# Patient Record
Sex: Male | Born: 1960 | Race: Black or African American | Hispanic: No | Marital: Single | Smoking: Current some day smoker
Health system: Southern US, Community
[De-identification: ages and names within clinical notes are randomized; demographics above are authoritative.]

## PROBLEM LIST (undated history)

## (undated) DIAGNOSIS — J45909 Unspecified asthma, uncomplicated: Secondary | ICD-10-CM

## (undated) DIAGNOSIS — R0600 Dyspnea, unspecified: Secondary | ICD-10-CM

## (undated) DIAGNOSIS — E119 Type 2 diabetes mellitus without complications: Secondary | ICD-10-CM

## (undated) DIAGNOSIS — I10 Essential (primary) hypertension: Secondary | ICD-10-CM

## (undated) DIAGNOSIS — F99 Mental disorder, not otherwise specified: Secondary | ICD-10-CM

## (undated) DIAGNOSIS — M1990 Unspecified osteoarthritis, unspecified site: Secondary | ICD-10-CM

## (undated) DIAGNOSIS — F329 Major depressive disorder, single episode, unspecified: Secondary | ICD-10-CM

## (undated) DIAGNOSIS — F32A Depression, unspecified: Secondary | ICD-10-CM

## (undated) HISTORY — PX: OTHER SURGICAL HISTORY: SHX169

---

## 1999-07-04 ENCOUNTER — Emergency Department (HOSPITAL_COMMUNITY): Admission: EM | Admit: 1999-07-04 | Discharge: 1999-07-04 | Payer: Self-pay | Admitting: *Deleted

## 1999-07-14 ENCOUNTER — Encounter: Admission: RE | Admit: 1999-07-14 | Discharge: 1999-08-22 | Payer: Self-pay | Admitting: *Deleted

## 2004-02-23 ENCOUNTER — Emergency Department (HOSPITAL_COMMUNITY): Admission: EM | Admit: 2004-02-23 | Discharge: 2004-02-23 | Payer: Self-pay | Admitting: Emergency Medicine

## 2008-04-13 ENCOUNTER — Inpatient Hospital Stay (HOSPITAL_COMMUNITY): Admission: EM | Admit: 2008-04-13 | Discharge: 2008-04-17 | Payer: Self-pay | Admitting: Emergency Medicine

## 2008-04-16 ENCOUNTER — Encounter (INDEPENDENT_AMBULATORY_CARE_PROVIDER_SITE_OTHER): Payer: Self-pay | Admitting: Internal Medicine

## 2008-04-19 ENCOUNTER — Encounter: Payer: Self-pay | Admitting: Internal Medicine

## 2008-04-19 ENCOUNTER — Ambulatory Visit: Payer: Self-pay | Admitting: Internal Medicine

## 2008-04-19 DIAGNOSIS — Z86718 Personal history of other venous thrombosis and embolism: Secondary | ICD-10-CM | POA: Insufficient documentation

## 2010-02-03 ENCOUNTER — Emergency Department (HOSPITAL_COMMUNITY): Admission: EM | Admit: 2010-02-03 | Discharge: 2010-02-03 | Payer: Self-pay | Admitting: Emergency Medicine

## 2010-11-11 NOTE — Discharge Summary (Signed)
NAME:  Sean [name], Sean [name] NO.:  [id number]   MEDICAL RECORD NO.:  [id number]          PATIENT TYPE:  INP   LOCATION:  4703                         FACILITY:  [facility]   PHYSICIAN:  Peggye Pitt, M.D. DATE OF BIRTH:  Nov 19, 1960   DATE OF ADMISSION:  04/12/2008  DATE OF DISCHARGE:  04/17/2008                               DISCHARGE SUMMARY   DISCHARGE DIAGNOSES:  1. Acute pulmonary embolus.  2. Acute pericarditis.   DISCHARGE MEDICATIONS:  1. Lovenox 150 mg subcu q. day for at least 2 more days until he is      seen at the outpatient clinic.  2. Coumadin 6 mg p.o. daily until he is seen at the [facility].  3. Aspirin 325 mg p.o. daily for chest pain associated with      pericarditis.   DISPOSITION AND FOLLOWUP:  The patient is discharged home in [name]  condition.  His chest pain is much improved.  He will follow up at the  [facility] with Dr. Mellody [name] on Thursday,  May 20, 2008 at 2 p.m.  At that time a stat PT/INR should be drawn  to see if the patient needs to continue to be on Lovenox or not.   CONSULTATIONS THIS HOSPITALIZATION:  None.   IMAGES THIS HOSPITALIZATION:  Include a chest x-ray on April 12, 2008  that showed bibasilar linear opacities suggesting subsegmental  atelectasis.  He also had a CT angiogram of the chest on April 13, 2008 that showed pulmonary embolus with no evidence of right heart  strain and mild fatty infiltration of the liver.  He also had a 2D  echocardiogram on April 16, 2008 that showed an ejection fraction of  55% but the study was inadequate to evaluate left ventricular regional  wall motion.  Left ventricular diastolic function parameters were,  however, normal.   HISTORY AND PHYSICAL:  For full details, please refer to the history and  physical exam dictated by Dr. Mikeal [name] on April 12, 2008 but in brief,  Sean [name] is a 50 year old African American man with no  significant past  medical history who started having severe left-sided chest pain that had  persisted for about 24 hours.  The chest pain was located over his left  chest, was worse with laying down flat and improved some with leaning  forward.  He had no diaphoresis, no radiation.  For this reason, we are  called for admission.   HOSPITAL COURSE BY ACTIVE PROBLEM:  Chest pain.  He was ruled out for  acute myocardial infarction with 3 sets of negative cardiac enzymes.  Initial EKG in the emergency department was consistent with acute  pericarditis with diffuse S-T segment elevation.  A curbside cardiology  consult was obtained and they also reviewed the EKG and confirmed these  findings.  Because of an elevated D-dimer, he was also sent for a CT  angiogram of the chest, which did show an acute pulmonary embolus, so  therefore he has 2 reasons to have acute pleuritic chest  pain.  For his  pericarditis, we have kept him on colchicine and on aspirin while in the  hospital.  His chest pain has much improved.  In regard to his pulmonary  embolus, he has been on Coumadin and subcu Lovenox while in the  hospital.  His INR on the day of discharge is 1.7.  I have set up an  appointment with him to see Dr. Onalee Hua 2 days from now, at which time a  PT/INR should be checked and the patient should be given further  instructions as to whether or not he should continue the Lovenox.  I  suspect by that time his INR will be therapeutic.  For his pulmonary  embolus, a hypercoagulable panel was also obtained, that showed no  evidence for lupus anticoagulant with normal protein C and protein S  levels but that will need to be followed up on as an outpatient because  his prothrombin gene mutation as well as his factor V Leiden are still  pending.  No other issues throughout this hospitalization.   VITAL SIGNS ON DAY OF DISCHARGE:  Blood pressure 131/79, heart rate 63,  respirations 18, O2 sats 94% on room  air with a temperature of 97.5.   LABS ON THE DAY OF DISCHARGE:  Sodium 137, potassium 3.8, chloride 104,  bicarb 28, BUN 10, creatinine 1.19 with a glucose of 89.  WBCs 5.1,  hemoglobin 14.7, platelets of 224 and INR of 1.7.      Peggye Pitt, M.D.  Electronically Signed     EH/MEDQ  D:  04/17/2008  T:  04/17/2008  Job:  [id number]   cc:   Sean Stable, MD

## 2010-11-11 NOTE — H&P (Signed)
NAME:  ROSHON, DUELL                 ACCOUNT NO.:  [id number]   MEDICAL RECORD NO.:  [id number]          PATIENT TYPE:  INP   LOCATION:  4703                         FACILITY:  [facility]   PHYSICIAN:  Lonia Blood, M.D.      DATE OF BIRTH:  13-Jun-1961   DATE OF ADMISSION:  04/12/2008  DATE OF DISCHARGE:                              HISTORY & PHYSICAL   PRIMARY CARE PHYSICIAN:  The patient is unassigned.   PRESENTING COMPLAINT:  Chest pain.   HISTORY OF PRESENT ILLNESS:  The patient is a 50 year old gentleman with  no significant past medical history who started experiencing severe left-  sided chest pain that is persistent for about 24 hours now.  Chest pain  is located in the precordial area, is sharp.  It worsened with laying  down flat.  He gets some relief with leaning forward.  No diaphoresis.  No radiation.  Pain has persisted as indicated, and the patient decided  to come to the emergency room.  He denied any injury or trauma.  There  was associated shortness of breath in the beginning but no nausea or  vomiting.  He also has some slight epigastric abdominal pain.  This is  the first ever episode.  Pain has eased up in the ED after initial  treatment.   PAST MEDICAL HISTORY:  None.   MEDICATIONS:  None.   ALLERGIES:  No known drug allergies.   SOCIAL HISTORY:  The patient lives in [place].  He smokes about a  half pack per day.  Drinks socially.  Denied IV drug use.   FAMILY HISTORY:  Denied any family history of cardiac disease.   REVIEW OF SYSTEMS:  Twelve-point review of systems is negative except  per HPI.   PHYSICAL EXAMINATION:  Temperature is 97.2, initial blood pressure  177/109 - currently 142/84, pulse 101, respiratory rate 30, saturations  99% on room air.  GENERAL:  The patient is anxious, awake, alert, oriented in no acute  distress.  HEENT:  PERRL.  EOMI.  His neck is supple.  No JVD, no lymphadenopathy.  RESPIRATORY:  The patient has good air entry  bilaterally.  No wheezes,  no rales.  CARDIOVASCULAR SYSTEM:  He has S1-S2.  No murmur.  ABDOMEN:  Is soft and nontender with positive bowel sounds.  EXTREMITIES:  No edema, cyanosis or clubbing.   His labs showed a sodium of 140, potassium 4, chloride 102, BUN 15,  creatinine 1.2, glucose 112, calcium 1.16.  Initial cardiac enzymes  showed a CK of 266 but normal index.  Troponin 0.01.  D-dimer is  elevated is 0.65.  Chest x-ray showed bibasilar linear opacities  suggesting subsegmental atelectasis.  There was low lung volumes which  causes crowding of the pulmonary vasculature.  His EKG showed normal  sinus rhythm with some peak T-waves, elevation of the ST region  suggestive of acute pericarditis.  Chest CT with contrast is also  suggestive of possible PE.   ASSESSMENT:  1. Therefore, this is a 50 year old gentleman with severe precordial      chest pain with  findings more suggestive of pericarditis and the      possibility of also pulmonary embolism.  Case has been discussed      with the cardiologist on call who strongly believes the patient is      exhibiting symptoms as well as findings and EKG changes suggestive      of pericarditis.  The patient has received some aspirin,      colchicine, etc., at this point.  He is feeling a little bit      better.  The issue of anticoagulation is, however, raised with the      cardiologist, especially since the patient has pericarditis.  He      does not thing that there is any problem with anticoagulating the      patient at this point since he is not having any hemorrhagic      pericarditis.  We will therefore empirically start heparin at this      point.  Will repeat the CT chest in the morning to see if the      patient truly has a PE.  In that case, I will start heparin in case      we need to turn it off quickly.  Meanwhile, I will continue with      colchicine for the patient's pericarditis.  Will consider steroids      as needed.   Possibly repeat his EKG in the morning, and if needed,      get cardiac consult once more.  2. Hypertension.  The patient's blood pressure was elevated in the ER,      but he has no prior history of hypertension.  Will follow his blood      pressure in the hospital, and if it continues to be elevated, we      will treat it.  3. Tobacco use.  The patient has been offered a nicotine patch as      needed, but at this point, pain control is the main issue.  Pain      Lonia Blood, M.D.  Electronically Signed     LG/MEDQ  D:  04/13/2008  T:  04/13/2008  Job:  [id number]

## 2011-03-02 ENCOUNTER — Emergency Department (HOSPITAL_COMMUNITY)
Admission: EM | Admit: 2011-03-02 | Discharge: 2011-03-02 | Disposition: A | Discharge status: Home / Self Care | Payer: No Typology Code available for payment source | Attending: Emergency Medicine | Admitting: Emergency Medicine

## 2011-03-02 DIAGNOSIS — T148XXA Other injury of unspecified body region, initial encounter: Secondary | ICD-10-CM | POA: Insufficient documentation

## 2011-03-02 DIAGNOSIS — M25559 Pain in unspecified hip: Secondary | ICD-10-CM | POA: Insufficient documentation

## 2011-03-02 DIAGNOSIS — M79609 Pain in unspecified limb: Secondary | ICD-10-CM | POA: Insufficient documentation

## 2011-03-31 LAB — BASIC METABOLIC PANEL
BUN: 12
BUN: 12
Calcium: 9
Creatinine, Ser: 1.19
Creatinine, Ser: 1.19
GFR calc Af Amer: >60
GFR calc Af Amer: >60
GFR calc non Af Amer: >60
GFR calc non Af Amer: >60
GFR calc non Af Amer: >60
Glucose, Bld: 116 — ABNORMAL HIGH
Potassium: 4.2

## 2011-03-31 LAB — CBC
HCT: 39.4
HCT: 42.6
HCT: 43.6
HCT: 46.3
Hemoglobin: 13.3
Hemoglobin: 15.2
MCHC: 32.8
MCHC: 33.8
MCV: 97
MCV: 98.9
Platelets: 202
Platelets: 211
Platelets: 220
Platelets: 224
Platelets: 227
RBC: 4.07 — ABNORMAL LOW
RDW: 12.5
RDW: 12.5
RDW: 12.6
RDW: 13
WBC: 6
WBC: 6.1
WBC: 7.4
WBC: 9.6

## 2011-03-31 LAB — CARDIAC PANEL(CRET KIN+CKTOT+MB+TROPI)
CK, MB: 0.9
CK, MB: 1.1
CK, MB: 1.4
Relative Index: 0.8
Relative Index: 0.9
Total CK: 102
Troponin I: 0.01
Troponin I: 0.01

## 2011-03-31 LAB — HEPARIN LEVEL (UNFRACTIONATED)
Heparin Unfractionated: 0.21 — ABNORMAL LOW
Heparin Unfractionated: 0.37
Heparin Unfractionated: 0.6

## 2011-03-31 LAB — POCT CARDIAC MARKERS
CKMB, poc: 1.9
Myoglobin, poc: 85.6
Troponin i, poc: <0.05

## 2011-03-31 LAB — LUPUS ANTICOAGULANT PANEL
DRVVT: 36.3 (ref 36.1–47.0)
PTT Lupus Anticoagulant: 52.1 — ABNORMAL HIGH (ref 36.3–48.8)

## 2011-03-31 LAB — LIPID PANEL
Cholesterol: 153
Total CHOL/HDL Ratio: 4.1
Triglycerides: 47

## 2011-03-31 LAB — POCT I-STAT, CHEM 8
BUN: 15
Calcium, Ion: 1.16
Chloride: 102
Creatinine, Ser: 1.2
Glucose, Bld: 112 — ABNORMAL HIGH
HCT: 46
Hemoglobin: 15.6
Potassium: 4
Sodium: 140
TCO2: 28

## 2011-03-31 LAB — CARDIOLIPIN ANTIBODIES, IGG, IGM, IGA
Anticardiolipin IgA: <7 — ABNORMAL LOW (ref <13)
Anticardiolipin IgG: <7 — ABNORMAL LOW (ref <11)
Anticardiolipin IgM: <7 — ABNORMAL LOW (ref <10)

## 2011-03-31 LAB — BASIC METABOLIC PANEL WITH GFR
CO2: 27
Calcium: 8.8
Chloride: 103
Glucose, Bld: 91
Sodium: 135

## 2011-03-31 LAB — PROTEIN C, TOTAL: Protein C, Total: 80 % (ref 70–140)

## 2011-03-31 LAB — PROTIME-INR
INR: 1
INR: 1
INR: 1.2
INR: 1.7 — ABNORMAL HIGH
Prothrombin Time: 13
Prothrombin Time: 13.9
Prothrombin Time: 20.5 — ABNORMAL HIGH

## 2011-03-31 LAB — D-DIMER, QUANTITATIVE: D-Dimer, Quant: 0.65 — ABNORMAL HIGH

## 2011-03-31 LAB — PROTHROMBIN GENE MUTATION

## 2011-03-31 LAB — B-NATRIURETIC PEPTIDE (CONVERTED LAB): Pro B Natriuretic peptide (BNP): <30

## 2011-03-31 LAB — TSH: TSH: 3.443

## 2011-03-31 LAB — APTT: aPTT: 54 — ABNORMAL HIGH

## 2011-03-31 LAB — PROTEIN C ACTIVITY: Protein C Activity: 137 % — ABNORMAL HIGH (ref 75–133)

## 2011-03-31 LAB — HOMOCYSTEINE: Homocysteine: 9.8

## 2011-03-31 LAB — BETA-2-GLYCOPROTEIN I ABS, IGG/M/A: Beta-2-Glycoprotein I IgM: <4 U/mL (ref <10)

## 2011-11-04 ENCOUNTER — Emergency Department (HOSPITAL_COMMUNITY)
Admission: EM | Admit: 2011-11-04 | Discharge: 2011-11-04 | Disposition: A | Discharge status: Home / Self Care | Payer: Medicaid Other | Attending: Emergency Medicine | Admitting: Emergency Medicine

## 2011-11-04 ENCOUNTER — Emergency Department (HOSPITAL_COMMUNITY): Payer: Medicaid Other

## 2011-11-04 ENCOUNTER — Encounter (HOSPITAL_COMMUNITY): Payer: Self-pay | Admitting: *Deleted

## 2011-11-04 DIAGNOSIS — H538 Other visual disturbances: Secondary | ICD-10-CM | POA: Insufficient documentation

## 2011-11-04 DIAGNOSIS — I10 Essential (primary) hypertension: Secondary | ICD-10-CM | POA: Insufficient documentation

## 2011-11-04 DIAGNOSIS — Z9889 Other specified postprocedural states: Secondary | ICD-10-CM | POA: Insufficient documentation

## 2011-11-04 DIAGNOSIS — G479 Sleep disorder, unspecified: Secondary | ICD-10-CM | POA: Insufficient documentation

## 2011-11-04 DIAGNOSIS — R51 Headache: Secondary | ICD-10-CM

## 2011-11-04 DIAGNOSIS — F17200 Nicotine dependence, unspecified, uncomplicated: Secondary | ICD-10-CM | POA: Insufficient documentation

## 2011-11-04 DIAGNOSIS — H53149 Visual discomfort, unspecified: Secondary | ICD-10-CM | POA: Insufficient documentation

## 2011-11-04 HISTORY — DX: Essential (primary) hypertension: I10

## 2011-11-04 MED ORDER — NAPROXEN 500 MG PO TABS: 500.0000 mg | ORAL_TABLET | Freq: Two times a day (BID) | ORAL | Status: AC

## 2011-11-04 MED ORDER — KETOROLAC TROMETHAMINE 60 MG/2ML IM SOLN
60.0000 mg | Freq: Once | INTRAMUSCULAR | Status: AC
  Administered 2011-11-04: 60 mg via INTRAMUSCULAR
  Filled 2011-11-04: qty 2

## 2011-11-04 NOTE — Discharge Instructions (Signed)
Please read and follow all provided instructions.  Your diagnoses today include:  1. Headache     Tests performed today include:  CT of your head which was normal and did not show any serious cause of your headache  Vital signs. See below for your results today.   Medications:  In the Emergency Department you received:  Toradol  You have been prescribed:  Naproxen - anti-inflammatory pain medication  Do not exceed 500mg  naproxen every 12 hours  You have been prescribed an anti-inflammatory medication or NSAID. Take with food. Take smallest effective dose for the shortest duration needed for your pain. Stop taking if you experience stomach pain or vomiting.   Take any prescribed medications only as directed.  Additional information:  Follow any educational materials contained in this packet.  You are having a headache. No specific cause was found today for your headache. It may have been a migraine or other cause of headache. Stress, anxiety, fatigue, and depression are common triggers for headaches.  Your headache today does not appear to be life-threatening or require hospitalization, but often the exact cause of headaches is not determined in the emergency department. Therefore, follow-up with your doctor is very important to find out what may have caused your headache and whether or not you need any further diagnostic testing or treatment.   Sometimes headaches can appear benign (not harmful), but then more serious symptoms can develop which should prompt an immediate re-evaluation by your doctor or the emergency department.  BE VERY CAREFUL not to take multiple medicines containing Tylenol (also called acetaminophen). Doing so can lead to an overdose which can damage your liver and cause liver failure and possibly death.   Follow-up instructions: Please follow-up with your primary care provider in the next 3 days for further evaluation of your symptoms. If you do not have a  primary care doctor -- see below for referral information.   Return instructions:   Please return to the Emergency Department if you experience worsening symptoms.  Return if the medications do not resolve your headache, if it recurs, or if you have multiple episodes of vomiting or cannot keep down fluids.  Return if you have a change from the usual headache.  RETURN IMMEDIATELY IF you:  Develop a sudden, severe headache  Develop confusion or become poorly responsive or faint  Develop a fever above 100.66F or problem breathing  Have a change in speech, vision, swallowing, or understanding  Develop new weakness, numbness, tingling, incoordination in your arms or legs  Have a seizure  Please return if you have any other emergent concerns.  Additional Information:  Your vital signs today were: BP 128/87  Pulse 85  Temp(Src) 98.4 F (36.9 C) (Oral)  Resp 16  SpO2 96% If your blood pressure (BP) was elevated above 135/85 this visit, please have this repeated by your doctor within one month. -------------- No Primary Care Doctor Call [facility]  [phone] Other agencies that provide inexpensive medical care    [facility]  [phone]    [facility]  [phone]    [facility]  [phone]    [facility]  [phone]    [facility]  [phone]    [facility]  [phone] -------------- RESOURCE GUIDE:  Dental Problems  Patients with Medicaid: [facility] [phone] [address]  [address] Phone:  [phone]                                                   Phone:  [phone]  If unable to pay or uninsured, contact:  [facility] or [facility]. to become qualified for the adult dental clinic.  Chronic Pain Problems Contact [facility]  [phone] Patients need to be referred by their  primary care doctor.  Insufficient Money for Medicine Contact [employer]:  call "211" or [facility] [phone].  Psychological Services [facility]  [phone] [facility]  [phone] [facility]   [phone] (emergency services [phone])  Substance Abuse Resources [facility]  [phone] [facility] [phone] [facility] [phone] Floydene Flock 4844268544 Residential & Outpatient Substance Abuse Program  [phone]  Abuse/Neglect [facility] Child Abuse Hotline (684)395-8367 [facility] Child Abuse Hotline 734-039-5839 (After Hours)  Emergency Shelter [facility] [phone]  Maternity [facility] [phone] [facility] [phone]  [facility] Resources  [facility]     [employer]                          [facility] Dept. [address] [place]                       [address]      [address]  Blondell Reveal Phone:  [phone]                                   Phone:  [phone]                 Phone:  [phone]  [facility] Phone:  [phone]  [facility] Child Abuse Hotline 7165066186 [phone] (After Hours)

## 2011-11-04 NOTE — ED Notes (Signed)
Pt is unable to tell me accurate PMH

## 2011-11-04 NOTE — ED Notes (Signed)
Patient is AOx4 and comfortable with his discharge instructions. 
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
## 2011-11-04 NOTE — ED Notes (Signed)
Pt states he has headaches everyday but today is worse, points to left side of head, pt is unable to tell me about the scar on top of his head. States he takes medication for his headaches but its not working today. gcs 15. Perrla. Reports photophobia. Denies nausea.

## 2011-11-04 NOTE — ED Notes (Signed)
Patient transported to CT 

## 2011-11-05 NOTE — ED Provider Notes (Signed)
Medical screening examination/treatment/procedure(s) were conducted as a shared visit with non-physician practitioner(s) and myself.  I personally evaluated the patient during the encounter Pt with headache.  Neuro exam normal  Benny Lennert, MD 11/05/11 1537

## 2012-02-02 ENCOUNTER — Emergency Department (HOSPITAL_COMMUNITY)
Admission: EM | Admit: 2012-02-02 | Discharge: 2012-02-02 | Disposition: A | Discharge status: Home / Self Care | Payer: Medicaid Other | Attending: Emergency Medicine | Admitting: Emergency Medicine

## 2012-02-02 ENCOUNTER — Encounter (HOSPITAL_COMMUNITY): Payer: Self-pay | Admitting: Emergency Medicine

## 2012-02-02 DIAGNOSIS — M79609 Pain in unspecified limb: Secondary | ICD-10-CM | POA: Insufficient documentation

## 2012-02-02 DIAGNOSIS — M7989 Other specified soft tissue disorders: Secondary | ICD-10-CM | POA: Insufficient documentation

## 2012-02-02 DIAGNOSIS — Z86718 Personal history of other venous thrombosis and embolism: Secondary | ICD-10-CM | POA: Insufficient documentation

## 2012-02-02 DIAGNOSIS — Z86711 Personal history of pulmonary embolism: Secondary | ICD-10-CM | POA: Insufficient documentation

## 2012-02-02 NOTE — ED Notes (Signed)
Pt c/o the wait .  He cannot wait.  He was ready to go when he arrived and has been threatening to walk out .

## 2012-02-02 NOTE — ED Notes (Signed)
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

## 2012-02-02 NOTE — ED Notes (Signed)
Neg test

## 2012-02-02 NOTE — ED Notes (Signed)
Pt angry leaving

## 2012-02-02 NOTE — Progress Notes (Signed)
VASCULAR LAB PRELIMINARY  PRELIMINARY  PRELIMINARY  PRELIMINARY  Right lower extremity venous duplex completed.    Preliminary report:  Right:  No evidence of DVT, superficial thrombosis, or Baker's cyst.  Seydina Holliman, RVT 02/02/2012, 6:00 PM

## 2012-02-02 NOTE — ED Notes (Signed)
Wait time discussed 

## 2012-10-07 ENCOUNTER — Encounter (HOSPITAL_COMMUNITY): Payer: Self-pay | Admitting: Emergency Medicine

## 2012-10-07 ENCOUNTER — Emergency Department (HOSPITAL_COMMUNITY)
Admission: EM | Admit: 2012-10-07 | Discharge: 2012-10-07 | Disposition: A | Discharge status: Home / Self Care | Payer: No Typology Code available for payment source | Attending: Emergency Medicine | Admitting: Emergency Medicine

## 2012-10-07 DIAGNOSIS — S335XXA Sprain of ligaments of lumbar spine, initial encounter: Secondary | ICD-10-CM | POA: Insufficient documentation

## 2012-10-07 DIAGNOSIS — E119 Type 2 diabetes mellitus without complications: Secondary | ICD-10-CM | POA: Insufficient documentation

## 2012-10-07 DIAGNOSIS — Z87891 Personal history of nicotine dependence: Secondary | ICD-10-CM | POA: Insufficient documentation

## 2012-10-07 DIAGNOSIS — Z8659 Personal history of other mental and behavioral disorders: Secondary | ICD-10-CM | POA: Insufficient documentation

## 2012-10-07 DIAGNOSIS — I10 Essential (primary) hypertension: Secondary | ICD-10-CM | POA: Insufficient documentation

## 2012-10-07 DIAGNOSIS — S39012A Strain of muscle, fascia and tendon of lower back, initial encounter: Secondary | ICD-10-CM

## 2012-10-07 DIAGNOSIS — Y9389 Activity, other specified: Secondary | ICD-10-CM | POA: Insufficient documentation

## 2012-10-07 DIAGNOSIS — Z8739 Personal history of other diseases of the musculoskeletal system and connective tissue: Secondary | ICD-10-CM | POA: Insufficient documentation

## 2012-10-07 DIAGNOSIS — Z79899 Other long term (current) drug therapy: Secondary | ICD-10-CM | POA: Insufficient documentation

## 2012-10-07 HISTORY — DX: Major depressive disorder, single episode, unspecified: F32.9

## 2012-10-07 HISTORY — DX: Depression, unspecified: F32.A

## 2012-10-07 HISTORY — DX: Type 2 diabetes mellitus without complications: E11.9

## 2012-10-07 HISTORY — DX: Mental disorder, not otherwise specified: F99

## 2012-10-07 HISTORY — DX: Unspecified osteoarthritis, unspecified site: M19.90
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
## 2018-07-20 ENCOUNTER — Encounter: Payer: Self-pay | Admitting: Specialist

## 2021-06-27 ENCOUNTER — Ambulatory Visit (INDEPENDENT_AMBULATORY_CARE_PROVIDER_SITE_OTHER): Payer: Self-pay | Admitting: Podiatry

## 2021-06-27 DIAGNOSIS — Z91199 Patient's noncompliance with other medical treatment and regimen due to unspecified reason: Secondary | ICD-10-CM

## 2021-06-27 NOTE — Progress Notes (Signed)
No Show

## 2021-11-22 ENCOUNTER — Encounter (HOSPITAL_COMMUNITY): Payer: Self-pay

## 2021-11-22 ENCOUNTER — Other Ambulatory Visit: Payer: Self-pay

## 2021-11-22 ENCOUNTER — Emergency Department (HOSPITAL_BASED_OUTPATIENT_CLINIC_OR_DEPARTMENT_OTHER): Payer: Medicare Other

## 2021-11-22 ENCOUNTER — Emergency Department (HOSPITAL_COMMUNITY)
Admission: EM | Admit: 2021-11-22 | Discharge: 2021-11-22 | Disposition: A | Discharge status: Home / Self Care | Payer: Medicare Other | Attending: Emergency Medicine | Admitting: Emergency Medicine

## 2021-11-22 DIAGNOSIS — M79604 Pain in right leg: Secondary | ICD-10-CM

## 2021-11-22 DIAGNOSIS — M79662 Pain in left lower leg: Secondary | ICD-10-CM | POA: Diagnosis present

## 2021-11-22 DIAGNOSIS — M79661 Pain in right lower leg: Secondary | ICD-10-CM | POA: Insufficient documentation

## 2021-11-22 DIAGNOSIS — M79605 Pain in left leg: Secondary | ICD-10-CM

## 2021-11-22 DIAGNOSIS — R252 Cramp and spasm: Secondary | ICD-10-CM | POA: Insufficient documentation

## 2021-11-22 LAB — BASIC METABOLIC PANEL WITH GFR
Anion gap: 5 (ref 5–15)
BUN: 24 mg/dL — ABNORMAL HIGH (ref 6–20)
CO2: 26 mmol/L (ref 22–32)
Calcium: 9.3 mg/dL (ref 8.9–10.3)
Chloride: 102 mmol/L (ref 98–111)
Creatinine, Ser: 1.25 mg/dL — ABNORMAL HIGH (ref 0.61–1.24)
GFR, Estimated: >60 mL/min
Glucose, Bld: 119 mg/dL — ABNORMAL HIGH (ref 70–99)
Potassium: 4.3 mmol/L (ref 3.5–5.1)
Sodium: 133 mmol/L — ABNORMAL LOW (ref 135–145)

## 2021-11-22 LAB — CBC
HCT: 34.7 % — ABNORMAL LOW (ref 39.0–52.0)
Hemoglobin: 11.8 g/dL — ABNORMAL LOW (ref 13.0–17.0)
MCH: 32.9 pg (ref 26.0–34.0)
MCHC: 34 g/dL (ref 30.0–36.0)
MCV: 96.7 fL (ref 80.0–100.0)
Platelets: 268 10*3/uL (ref 150–400)
RBC: 3.59 MIL/uL — ABNORMAL LOW (ref 4.22–5.81)
RDW: 13.2 % (ref 11.5–15.5)
WBC: 9.7 10*3/uL (ref 4.0–10.5)
nRBC: 0 % (ref 0.0–0.2)

## 2021-11-22 MED ORDER — SODIUM CHLORIDE 0.9 % IV BOLUS
1000.0000 mL | Freq: Once | INTRAVENOUS | Status: AC
  Administered 2021-11-22: 1000 mL via INTRAVENOUS

## 2021-11-22 MED ORDER — METHOCARBAMOL 500 MG PO TABS: 500.0000 mg | ORAL_TABLET | Freq: Two times a day (BID) | ORAL | 0 refills | Status: DC

## 2021-11-22 NOTE — ED Triage Notes (Signed)
Pt reports intermittent bilateral leg cramping x6-8 months.

## 2021-11-22 NOTE — Discharge Instructions (Signed)
You were diagnosed today with muscle cramps.  There was no sign of blood clot on the imaging study.  I have prescribed methocarbamol, and muscle relaxant.  I recommend use this at night.  Do not drive while taking the medication.

## 2021-11-22 NOTE — ED Provider Notes (Signed)
[facility]-EMERGENCY DEPT Provider Note   CSN: [id number] Arrival date & time: 11/22/21  1254     History  Chief Complaint  Patient presents with   Leg Pain    Sean [name] is a 61 y.o. male.  Patient presents to the hospital complaining of bilateral lower leg pain.  Patient states he has concerns about possible blood clots.  He also states that he feels like his legs have cramped over the past few days.  He does have long-term pain in both lower extremities with no definitive cause.  This has been evaluated by primary care before.  He denies any falls or recent injuries.  He has a concern about blood clots due to a remote history of pulmonary embolism.  He does endorse pain in the calves of bilateral legs with slightly increased swelling.  Intermittent cramp feelings at night.  Denies shortness of breath, chest pain.  Past medical history significant for hypertension, arthritis, DM without complication, depression  HPI     Home Medications Prior to Admission medications   Medication Sig Start Date End Date Taking? Authorizing Provider  methocarbamol (ROBAXIN) 500 MG tablet Take 1 tablet (500 mg total) by mouth 2 (two) times daily. 11/22/21  Yes Darrick Grinder, PA-C      Allergies    Patient has no known allergies.    Review of Systems   Review of Systems  Respiratory:  Negative for shortness of breath.   Cardiovascular:  Positive for leg swelling. Negative for chest pain.  Gastrointestinal:  Negative for abdominal pain, nausea and vomiting.  Musculoskeletal:  Positive for arthralgias and myalgias.   Physical Exam Updated Vital Signs BP 114/86 (BP Location: Right Arm)   Pulse 89   Temp 98 F (36.7 C) (Oral)   Resp 18   SpO2 98%  Physical Exam Vitals and nursing note reviewed.  Constitutional:      General: He is not in acute distress. HENT:     Head: Normocephalic and atraumatic.  Eyes:     Conjunctiva/sclera: Conjunctivae normal.   Cardiovascular:     Rate and Rhythm: Normal rate and regular rhythm.     Pulses: Normal pulses.     Heart sounds: Normal heart sounds.  Pulmonary:     Effort: Pulmonary effort is normal.     Breath sounds: Normal breath sounds.  Abdominal:     Palpations: Abdomen is soft.  Musculoskeletal:        General: Swelling present.     Cervical back: Normal range of motion.     Comments: Mild swelling noted to the bilateral calves.  Tenderness to palpation of calf muscle bilaterally.  Palpable DP bilaterally  Neurological:     Mental Status: He is alert.    ED Results / Procedures / Treatments   Labs (all labs ordered are listed, but only abnormal results are displayed) Labs Reviewed  BASIC METABOLIC PANEL - Abnormal; Notable for the following components:      Result Value   Sodium 133 (*)    Glucose, Bld 119 (*)    BUN 24 (*)    Creatinine, Ser 1.25 (*)    All other components within normal limits  CBC - Abnormal; Notable for the following components:   RBC 3.59 (*)    Hemoglobin 11.8 (*)    HCT 34.7 (*)    All other components within normal limits    EKG None  Radiology VAS [place] LOWER EXTREMITY VENOUS (DVT) (7a-7p)  Result Date: 11/22/2021  Lower Venous DVT Study Patient Name:  Ronnald NianROBERT F Guedea  Date of Exam:   11/22/2021 Medical Rec #: [id number]      Accession #:    [id number] Date of Birth: 03/30/1961       Patient Gender: M Patient Age:   [age] years [facility] Procedure:      VAS US LOWER EXTREMITY VENOUS (DVT) Referring Phys: Barrie DunkerLARRY Omarie Parcell --------------------------------------------------------------------------------  Indications: Pain bilateral legs x 6-8 months.  Comparison Study: No prior studies. Performing Technologist: Jean Rosenthalachel Hodge RDMS, RVT  Examination Guidelines: A complete evaluation includes B-mode imaging, spectral Doppler, color Doppler, and power Doppler as needed of all accessible portions of each vessel. Bilateral testing is considered an  integral part of a complete examination. Limited examinations for reoccurring indications may be performed as noted. The reflux portion of the exam is performed with the patient in reverse Trendelenburg.  +---------+---------------+---------+-----------+----------+--------------+ RIGHT    CompressibilityPhasicitySpontaneityPropertiesThrombus Aging +---------+---------------+---------+-----------+----------+--------------+ CFV      Full           Yes      Yes                                 +---------+---------------+---------+-----------+----------+--------------+ SFJ      Full                                                        +---------+---------------+---------+-----------+----------+--------------+ FV Prox  Full                                                        +---------+---------------+---------+-----------+----------+--------------+ FV Mid   Full                                                        +---------+---------------+---------+-----------+----------+--------------+ FV DistalFull                                                        +---------+---------------+---------+-----------+----------+--------------+ PFV      Full                                                        +---------+---------------+---------+-----------+----------+--------------+ POP      Full           Yes      Yes                                 +---------+---------------+---------+-----------+----------+--------------+ PTV      Full                                                        +---------+---------------+---------+-----------+----------+--------------+  PERO     Full                                                        +---------+---------------+---------+-----------+----------+--------------+ Gastroc  Full                                                         +---------+---------------+---------+-----------+----------+--------------+   +---------+---------------+---------+-----------+----------+--------------+ LEFT     CompressibilityPhasicitySpontaneityPropertiesThrombus Aging +---------+---------------+---------+-----------+----------+--------------+ CFV      Full           Yes      Yes                                 +---------+---------------+---------+-----------+----------+--------------+ SFJ      Full                                                        +---------+---------------+---------+-----------+----------+--------------+ FV Prox  Full                                                        +---------+---------------+---------+-----------+----------+--------------+ FV Mid   Full                                                        +---------+---------------+---------+-----------+----------+--------------+ FV DistalFull                                                        +---------+---------------+---------+-----------+----------+--------------+ PFV      Full                                                        +---------+---------------+---------+-----------+----------+--------------+ POP      Full           Yes      Yes                                 +---------+---------------+---------+-----------+----------+--------------+ PTV      Full                                                        +---------+---------------+---------+-----------+----------+--------------+  PERO     Full                                                        +---------+---------------+---------+-----------+----------+--------------+ Gastroc  Full                                                        +---------+---------------+---------+-----------+----------+--------------+     Summary: RIGHT: - There is no evidence of deep vein thrombosis in the lower extremity.  - No cystic structure found in  the popliteal fossa.  LEFT: - There is no evidence of deep vein thrombosis in the lower extremity.  - No cystic structure found in the popliteal fossa.  *See table(s) above for measurements and observations.    Preliminary     Procedures Procedures    Medications Ordered in ED Medications  sodium chloride 0.9 % bolus 1,000 mL (1,000 mLs Intravenous New Bag/Given 11/22/21 1435)    ED Course/ Medical Decision Making/ A&P                           Medical Decision Making Amount and/or Complexity of Data Reviewed Labs: ordered.   This patient presents to the ED for concern of bilateral leg pain, this involves an extensive number of treatment options, and is a complaint that carries with it a high risk of complications and morbidity.  The differential diagnosis includes DVT, muscle cramps, intermittent claudication, and others   Co morbidities that complicate the patient evaluation  History of PE   Lab Tests:  I Ordered, and personally interpreted labs.  The pertinent results include: Sodium 133, creatinine 1.25, hemoglobin 11.8   Imaging Studies ordered:  I ordered imaging studies including DVT study of lower extremities I independently visualized and interpreted imaging which showed no evidence of DVT I agree with the radiologist interpretation   Problem List / ED Course / Critical interventions / Medication management  I ordered medication including normal saline for hydration Reevaluation of the patient after these medicines showed that the patient stayed the same I have reviewed the patients home medicines and have made adjustments as needed   Test / Admission - Considered:  The patient has no signs of DVT on venous study.  Electrolytes are grossly normal.  The patient may have intermittent claudication versus muscle cramps.  I will send him home with a prescription for methocarbamol from muscle relaxants for the patient to use at night.  I recommend that he follow-up  with primary care for further evaluation of his leg pain  Final Clinical Impression(s) / ED Diagnoses Final diagnoses:  Right leg pain  Left leg pain  Muscle cramp    Rx / DC Orders ED Discharge Orders          Ordered    methocarbamol (ROBAXIN) 500 MG tablet  2 times daily        11/22/21 1701              Pamala Duffel 11/22/21 1702    Cheryll Cockayne, MD 11/27/21 1649

## 2021-11-22 NOTE — Progress Notes (Signed)
Lower extremity venous bilateral study completed.  Preliminary results relayed to provider in ED.  See CV Proc for preliminary results report.   Isaac Lacson, RDMS, RVT  

## 2021-11-25 ENCOUNTER — Encounter (HOSPITAL_COMMUNITY): Payer: Self-pay

## 2021-11-25 ENCOUNTER — Other Ambulatory Visit: Payer: Self-pay

## 2021-11-25 ENCOUNTER — Inpatient Hospital Stay (HOSPITAL_COMMUNITY)
Admission: EM | Admit: 2021-11-25 | Discharge: 2021-12-04 | DRG: 469 | Disposition: A | Discharge status: Skilled Nursing Facility | Payer: Medicare Other | Attending: Internal Medicine | Admitting: Internal Medicine

## 2021-11-25 ENCOUNTER — Emergency Department (HOSPITAL_COMMUNITY): Payer: Medicare Other

## 2021-11-25 ENCOUNTER — Emergency Department (HOSPITAL_COMMUNITY)
Admission: EM | Admit: 2021-11-25 | Discharge: 2021-11-25 | Disposition: A | Discharge status: Home / Self Care | Payer: Medicare Other | Source: Home / Self Care | Attending: Emergency Medicine | Admitting: Emergency Medicine

## 2021-11-25 DIAGNOSIS — E875 Hyperkalemia: Secondary | ICD-10-CM | POA: Diagnosis not present

## 2021-11-25 DIAGNOSIS — M179 Osteoarthritis of knee, unspecified: Secondary | ICD-10-CM | POA: Insufficient documentation

## 2021-11-25 DIAGNOSIS — M8700 Idiopathic aseptic necrosis of unspecified bone: Secondary | ICD-10-CM

## 2021-11-25 DIAGNOSIS — Z79899 Other long term (current) drug therapy: Secondary | ICD-10-CM

## 2021-11-25 DIAGNOSIS — M1990 Unspecified osteoarthritis, unspecified site: Secondary | ICD-10-CM

## 2021-11-25 DIAGNOSIS — Z86711 Personal history of pulmonary embolism: Secondary | ICD-10-CM

## 2021-11-25 DIAGNOSIS — Z791 Long term (current) use of non-steroidal anti-inflammatories (NSAID): Secondary | ICD-10-CM

## 2021-11-25 DIAGNOSIS — R262 Difficulty in walking, not elsewhere classified: Secondary | ICD-10-CM | POA: Diagnosis present

## 2021-11-25 DIAGNOSIS — E66813 Obesity, class 3: Secondary | ICD-10-CM

## 2021-11-25 DIAGNOSIS — G8929 Other chronic pain: Secondary | ICD-10-CM

## 2021-11-25 DIAGNOSIS — M8568 Other cyst of bone, other site: Secondary | ICD-10-CM | POA: Diagnosis present

## 2021-11-25 DIAGNOSIS — E871 Hypo-osmolality and hyponatremia: Secondary | ICD-10-CM | POA: Diagnosis present

## 2021-11-25 DIAGNOSIS — Z6841 Body Mass Index (BMI) 40.0 and over, adult: Secondary | ICD-10-CM

## 2021-11-25 DIAGNOSIS — I10 Essential (primary) hypertension: Secondary | ICD-10-CM | POA: Diagnosis present

## 2021-11-25 DIAGNOSIS — F32A Depression, unspecified: Secondary | ICD-10-CM | POA: Diagnosis present

## 2021-11-25 DIAGNOSIS — M1711 Unilateral primary osteoarthritis, right knee: Secondary | ICD-10-CM | POA: Diagnosis present

## 2021-11-25 DIAGNOSIS — Z5902 Unsheltered homelessness: Secondary | ICD-10-CM

## 2021-11-25 DIAGNOSIS — S72091A Other fracture of head and neck of right femur, initial encounter for closed fracture: Secondary | ICD-10-CM | POA: Diagnosis present

## 2021-11-25 DIAGNOSIS — Z7984 Long term (current) use of oral hypoglycemic drugs: Secondary | ICD-10-CM

## 2021-11-25 DIAGNOSIS — F419 Anxiety disorder, unspecified: Secondary | ICD-10-CM | POA: Diagnosis present

## 2021-11-25 DIAGNOSIS — S72051A Unspecified fracture of head of right femur, initial encounter for closed fracture: Principal | ICD-10-CM

## 2021-11-25 DIAGNOSIS — D62 Acute posthemorrhagic anemia: Secondary | ICD-10-CM | POA: Diagnosis not present

## 2021-11-25 DIAGNOSIS — M25751 Osteophyte, right hip: Secondary | ICD-10-CM | POA: Diagnosis present

## 2021-11-25 DIAGNOSIS — Z87891 Personal history of nicotine dependence: Secondary | ICD-10-CM

## 2021-11-25 DIAGNOSIS — Z86718 Personal history of other venous thrombosis and embolism: Secondary | ICD-10-CM

## 2021-11-25 DIAGNOSIS — N179 Acute kidney failure, unspecified: Secondary | ICD-10-CM | POA: Diagnosis not present

## 2021-11-25 DIAGNOSIS — M879 Osteonecrosis, unspecified: Principal | ICD-10-CM | POA: Diagnosis present

## 2021-11-25 DIAGNOSIS — E860 Dehydration: Secondary | ICD-10-CM | POA: Diagnosis present

## 2021-11-25 DIAGNOSIS — E119 Type 2 diabetes mellitus without complications: Secondary | ICD-10-CM | POA: Diagnosis present

## 2021-11-25 DIAGNOSIS — M87051 Idiopathic aseptic necrosis of right femur: Secondary | ICD-10-CM

## 2021-11-25 DIAGNOSIS — R338 Other retention of urine: Secondary | ICD-10-CM | POA: Diagnosis not present

## 2021-11-25 MED ORDER — DICLOFENAC SODIUM 50 MG PO TBEC: 50.0000 mg | DELAYED_RELEASE_TABLET | Freq: Two times a day (BID) | ORAL | 0 refills | Status: DC

## 2021-11-25 MED ORDER — OXYCODONE-ACETAMINOPHEN 5-325 MG PO TABS: 1.0000 | ORAL_TABLET | Freq: Four times a day (QID) | ORAL | 0 refills | Status: DC | PRN

## 2021-11-25 MED ORDER — OXYCODONE-ACETAMINOPHEN 5-325 MG PO TABS
1.0000 | ORAL_TABLET | Freq: Once | ORAL | Status: AC
  Administered 2021-11-25: 1 via ORAL
  Filled 2021-11-25: qty 1

## 2021-11-25 MED ORDER — OXYCODONE HCL 5 MG PO TABS
5.0000 mg | ORAL_TABLET | Freq: Once | ORAL | Status: AC
  Administered 2021-11-26: 5 mg via ORAL
  Filled 2021-11-25: qty 1

## 2021-11-25 MED ORDER — ACETAMINOPHEN 325 MG PO TABS
650.0000 mg | ORAL_TABLET | Freq: Once | ORAL | Status: AC
  Administered 2021-11-25: 650 mg via ORAL
  Filled 2021-11-25: qty 2

## 2021-11-25 MED ORDER — IBUPROFEN 400 MG PO TABS
600.0000 mg | ORAL_TABLET | Freq: Once | ORAL | Status: AC
Start: 2021-11-25 — End: 2021-11-25
  Administered 2021-11-25: 600 mg via ORAL
  Filled 2021-11-25: qty 1

## 2021-11-25 MED ORDER — HYDROMORPHONE HCL 1 MG/ML IJ SOLN
1.0000 mg | Freq: Once | INTRAMUSCULAR | Status: AC
  Administered 2021-11-25: 1 mg via INTRAMUSCULAR
  Filled 2021-11-25: qty 1

## 2021-11-25 MED ORDER — DEXAMETHASONE SODIUM PHOSPHATE 10 MG/ML IJ SOLN
10.0000 mg | Freq: Once | INTRAMUSCULAR | Status: AC
  Administered 2021-11-26: 10 mg via INTRAMUSCULAR
  Filled 2021-11-25: qty 1

## 2021-11-25 MED ORDER — ACETAMINOPHEN 500 MG PO TABS
1000.0000 mg | ORAL_TABLET | Freq: Once | ORAL | Status: AC
  Administered 2021-11-26: 1000 mg via ORAL
  Filled 2021-11-25: qty 2

## 2021-11-25 MED ORDER — METHOCARBAMOL 500 MG PO TABS
500.0000 mg | ORAL_TABLET | Freq: Once | ORAL | Status: AC
  Administered 2021-11-25: 500 mg via ORAL
  Filled 2021-11-25: qty 1

## 2021-11-25 NOTE — ED Triage Notes (Signed)
Pt here via [facility] for R knee, R hip and back pain x6 months. Pt states he cant move and he refuses to. PT was seen at [facility] ED for the same earlier today and was given a knee brace. 146/80, 106HR, CBG 148, 95% RA

## 2021-11-25 NOTE — Discharge Instructions (Addendum)
As we discussed, your work-up in the ER today was reassuring for acute abnormalities.  X-ray imaging of your knee did reveal severe osteoarthritis that you will need to follow-up with orthopedics for further evaluation and management of.  I have given you a referral with a number to call to schedule an appointment for this.  I have also given you a prescription for 2 medications to help with pain control of your knee.  I recommend that you take these as prescribed as needed only for severe pain.  Please do not drive or operate heavy machinery after taking narcotic pain medication.  Also please be aware that the anti-inflammatory medication does slightly increase your risk of GI bleeding.  Please stop the medication and return to the emergency department if you develop blood in your stools or dark tarry stools.  I have also given you a brace to wear for additional support of your knee.  I also recommend rest, ice, compression, and elevation of your knee for additional support.  Return if development of any new or worsening symptoms.  Return if development of any new or worsening symptoms.

## 2021-11-25 NOTE — ED Provider Notes (Incomplete)
  Physical Exam  BP 130/75   Pulse 87   Temp 98.9 F (37.2 C) (Oral)   Resp 14   SpO2 97%   Physical Exam  Procedures  Procedures  ED Course / MDM    Medical Decision Making Amount and/or Complexity of Data Reviewed Radiology: ordered.  Risk OTC drugs. Prescription drug management.

## 2021-11-25 NOTE — ED Provider Notes (Signed)
[facility] DEPT Provider Note   CSN: [id number] Arrival date & time: 11/25/21  [id number]     History  Chief Complaint  Patient presents with   Leg Pain    Sean [name] is a 61 y.o. male.  Patient with history of PE in 2009 presents today with complaints of right knee pain. He states that same has been ongoing for the past 6-8 months and has been worsening recently. He states that 3 days ago he presented here for same and was concerned about blood clots which was ruled out at that time. He states that about a week ago he went to [facility] for his knee pain and had x-rays performed which revealed that 'there's bone on bone in my knee, they told me I need surgery.' He has not scheduled follow-up at this time. Endorses significant pain with ambulation to the center of his right knee that is sharp in nature. He states that he is having worsening difficulty with ambulation due to pain. Denies any acute knee injury.  The history is provided by the patient. No language interpreter was used.  Leg Pain Associated symptoms: no fever       Home Medications Prior to Admission medications   Medication Sig Start Date End Date Taking? Authorizing Provider  methocarbamol (ROBAXIN) 500 MG tablet Take 1 tablet (500 mg total) by mouth 2 (two) times daily. 11/22/21   Dorothyann Peng, PA-C      Allergies    Patient has no known allergies.    Review of Systems   Review of Systems  Constitutional:  Negative for chills and fever.  Musculoskeletal:  Positive for arthralgias.  All other systems reviewed and are negative.  Physical Exam Updated Vital Signs BP (!) 143/104 (BP Location: Right Arm)   Pulse (!) 102   Temp 98.2 F (36.8 C) (Oral)   Resp 17   Ht 5\' 6"  (1.676 m)   Wt 131.5 kg   SpO2 100%   BMI 46.81 kg/m  Physical Exam Vitals and nursing note reviewed.  Constitutional:      General: He is not in acute distress.    Appearance: Normal appearance. He  is normal weight. He is not ill-appearing, toxic-appearing or diaphoretic.  HENT:     Head: Normocephalic and atraumatic.  Cardiovascular:     Rate and Rhythm: Normal rate.  Pulmonary:     Effort: Pulmonary effort is normal. No respiratory distress.  Musculoskeletal:        General: Normal range of motion.     Cervical back: Normal range of motion.     Comments: Tenderness to palpation of the anteriolateral aspect of the right knee. No swelling, erythema, warmth, or obvious deformity noted. DP and PT pulses intact and 2+  Skin:    General: Skin is warm and dry.  Neurological:     General: No focal deficit present.     Mental Status: He is alert.  Psychiatric:        Mood and Affect: Mood normal.        Behavior: Behavior normal.    ED Results / Procedures / Treatments   Labs (all labs ordered are listed, but only abnormal results are displayed) Labs Reviewed - No data to display  EKG None  Radiology DG Knee Complete 4 Views Right  Result Date: 11/25/2021 CLINICAL DATA:  61 year old male with history of chronic right knee pain. EXAM: RIGHT KNEE - COMPLETE 4+ VIEW COMPARISON:  No priors. FINDINGS:  Four views of the right knee demonstrate no acute displaced fracture, subluxation or dislocation. There is joint space narrowing, subchondral sclerosis, subchondral cyst formation and osteophyte formation in a tricompartmental distribution, indicative of osteoarthritis. IMPRESSION: 1. No acute radiographic abnormality of the right knee. 2. Tricompartmental osteoarthritis, as above. Electronically Signed   By: Vinnie Langton M.D.   On: 11/25/2021 07:28    Procedures Procedures    Medications Ordered in ED Medications  oxyCODONE-acetaminophen (PERCOCET/ROXICET) 5-325 MG per tablet 1 tablet (has no administration in time range)    ED Course/ Medical Decision Making/ A&P                           Medical Decision Making Amount and/or Complexity of Data Reviewed Radiology:  ordered.  Risk Prescription drug management.   Patient presents today with 6-8 months of right knee pain. He originally presented 3 days ago and given his remote history of DVT in 2009, had bilateral lower extremity US performed which was negative. He also had labs performed at this time to access for electrolyte abnormalities both of which were negative. I have personally reviewed and evaluated the note from this visit as well as the above findings. Given patients concern for knee effusion, knee x-ray performed today which revealed tricompartmental osteoarthritis but no other acute findings.  I have personally reviewed and interpreted this imaging and agree with radiology interpretation. On physical exam, I am unable to visualize any obvious signs of swelling. Also no erythema or warmth present. Patient is afebrile, non-toxic appearing, and in no acute distress with reassuring vital signs. Very low suspicion for septic arthritis or any other emergent concerns at this time. Pain managed in ED. Patient given brace while in ED. Will also give patient a referral to a different orthopedist at patients request. Patient also states that he was originally able to manage his pain with Percocet prescribed by his PCP, but has been unable to set up an appointment to get a refill of this medication. Will therefore prescribe a short course of narcotics to bridge the gap until he is able to see his doctor again. I have reviewed PDMP and deemed patient adequate candidate for short course of narcotics. Will also give prescription for anti-inflammatory medication to take as prescribed as needed for additional pain control. Patient is understanding and amenable with plan. Educated on red flag symptoms that would prompt immediate return. Discharged in stable condition.   Final Clinical Impression(s) / ED Diagnoses Final diagnoses:  Chronic pain of right knee  Osteoarthritis of right knee, unspecified osteoarthritis type     Rx / DC Orders ED Discharge Orders          Ordered    oxyCODONE-acetaminophen (PERCOCET/ROXICET) 5-325 MG tablet  Every 6 hours PRN        11/25/21 0831    diclofenac (VOLTAREN) 50 MG EC tablet  2 times daily        11/25/21 0831          An After Visit Summary was printed and given to the patient.     Bud Face, PA-C 11/25/21 0840    Lennice Sites, DO 11/25/21 [id number]

## 2021-11-25 NOTE — ED Provider Notes (Signed)
[facility] EMERGENCY DEPARTMENT Provider Note   CSN: [id number] Arrival date & time: 11/25/21  1336     History  Chief Complaint  Patient presents with   Knee Pain    Sean [name] is a 61 y.o. male with a past medical history of arthritis, diabetes, hypertension presenting to the ED with a chief complaint of knee pain and trouble walking.  Patient has been experiencing right-sided knee pain and hip pain for the past 6 months.  He has been able to manage his symptoms at home and "limp around" despite the pain.  He was evaluated a few days ago for pain, had a negative ultrasound to rule out a DVT.  He was evaluated this morning in the ER as well, x-ray of his knee showed findings consistent with osteoarthritis.  States that he went home and at that point his pain got worse.  He states that he is having trouble moving his leg due to the pain.  States that he cannot get around in his home by himself as he lives alone.  He did not pick up the prescriptions from the ER this morning for the Percocet and Voltaren.  He denies any injury or trauma, numbness, weakness or temperature change   Knee Pain Associated symptoms: no fever       Home Medications Prior to Admission medications   Medication Sig Start Date End Date Taking? Authorizing Provider  Acetaminophen (TYLENOL PO) Take 1 tablet by mouth 2 (two) times daily as needed (pain).    [provider]  amoxicillin-clavulanate (AUGMENTIN) 875-125 MG tablet SMARTSIG:1 Tablet(s) By Mouth Every 12 Hours Patient not taking: Reported on 11/25/2021 11/03/21   [provider]  benztropine (COGENTIN) 0.5 MG tablet Take 0.5-1 mg by mouth at bedtime. 10/01/21   [provider]  diclofenac (VOLTAREN) 50 MG EC tablet Take 1 tablet (50 mg total) by mouth 2 (two) times daily for 7 days. 11/25/21 12/02/21  Smoot, Shawn Route, PA-C  FLUoxetine (PROZAC) 40 MG capsule Take 40 mg by mouth every morning. 10/01/21   [provider]  gabapentin (NEURONTIN) 400 MG capsule Take 400 mg by mouth daily. 10/24/21   [provider]  lisinopril-hydrochlorothiazide (ZESTORETIC) 20-25 MG tablet Take 1 tablet by mouth daily. 10/24/21   [provider]  meloxicam (MOBIC) 15 MG tablet Take 15 mg by mouth daily. 10/24/21   [provider]  Menthol, Topical Analgesic, (ICY HOT EX) Apply 1 application. topically 2 (two) times daily as needed (pain).    [provider]  metFORMIN (GLUCOPHAGE) 500 MG tablet Take 500 mg by mouth See admin instructions. Take 500 mg by mouth 1-2 times a day 08/11/21   [provider]  methocarbamol (ROBAXIN) 500 MG tablet Take 1 tablet (500 mg total) by mouth 2 (two) times daily. 11/22/21   Darrick Grinder, PA-C  omeprazole (PRILOSEC) 20 MG capsule Take 20 mg by mouth daily. Patient not taking: Reported on 11/25/2021 09/12/21   [provider]  oxyCODONE-acetaminophen (PERCOCET/ROXICET) 5-325 MG tablet Take 1 tablet by mouth every 6 (six) hours as needed for severe pain. 11/25/21   Smoot, Sarah A, PA-C  risperiDONE (RISPERDAL) 1 MG tablet Take 1 mg by mouth See admin instructions. 1 mg by mouth 1-2 times a day 10/01/21   [provider]  traZODone (DESYREL) 100 MG tablet Take 100 mg by mouth at bedtime. 10/01/21   [provider]  Vitamin D, Ergocalciferol, (DRISDOL) 1.25 MG (50000  UNIT) CAPS capsule Take 50,000 Units by mouth once a week. Patient not taking: Reported on 11/25/2021 10/24/21   [provider]      Allergies    Patient has no known allergies.    Review of Systems   Review of Systems  Constitutional:  Negative for fever.  Musculoskeletal:  Positive for arthralgias.  Neurological:  Negative for weakness.   Physical Exam Updated Vital Signs BP (!) 144/66 (BP Location: Right Arm)   Pulse (!) 102   Temp 98.9 F (37.2 C) (Oral)   Resp 18   SpO2 96%  Physical Exam Vitals and nursing note reviewed.   Constitutional:      General: He is not in acute distress.    Appearance: He is well-developed. He is not diaphoretic.  HENT:     Head: Normocephalic and atraumatic.  Eyes:     General: No scleral icterus.    Conjunctiva/sclera: Conjunctivae normal.  Pulmonary:     Effort: Pulmonary effort is normal. No respiratory distress.  Musculoskeletal:        General: Tenderness present.     Cervical back: Normal range of motion.     Right lower leg: No edema.     Left lower leg: No edema.     Comments: 2+ DP pulse palpated bilaterally.  Tenderness to palpation of the right knee without erythema, edema or warmth of joint.  Patient unable to perform range of motion.  No deformities noted. Full ROM of R ankle.  Skin:    Findings: No rash.  Neurological:     Mental Status: He is alert.    ED Results / Procedures / Treatments   Labs (all labs ordered are listed, but only abnormal results are displayed) Labs Reviewed - No data to display  EKG None  Radiology DG Knee Complete 4 Views Right  Result Date: 11/25/2021 CLINICAL DATA:  61 year old male with history of chronic right knee pain. EXAM: RIGHT KNEE - COMPLETE 4+ VIEW COMPARISON:  No priors. FINDINGS: Four views of the right knee demonstrate no acute displaced fracture, subluxation or dislocation. There is joint space narrowing, subchondral sclerosis, subchondral cyst formation and osteophyte formation in a tricompartmental distribution, indicative of osteoarthritis. IMPRESSION: 1. No acute radiographic abnormality of the right knee. 2. Tricompartmental osteoarthritis, as above. Electronically Signed   By: Trudie Reed M.D.   On: 11/25/2021 07:28    Procedures Procedures    Medications Ordered in ED Medications  methocarbamol (ROBAXIN) tablet 500 mg (has no administration in time range)  HYDROmorphone (DILAUDID) injection 1 mg (has no administration in time range)  acetaminophen (TYLENOL) tablet 650 mg (has no administration in  time range)  ibuprofen (ADVIL) tablet 600 mg (has no administration in time range)    ED Course/ Medical Decision Making/ A&P                           Medical Decision Making Risk OTC drugs. Prescription drug management.   61 year old male presenting to the ED with a chief complaint of right knee pain.  He has had intermittent but manageable right knee pain, right hip pain for the past 6 months.  He can manage his symptoms at home and can limp at home.  He was evaluated a few days ago with negative ultrasound for DVT.  Evaluated this morning for continued knee pain with x-ray showing tricompartmental osteoarthritis.  He was given a prescription for Percocet and Voltaren which she did  not pick up.  He returns because his symptoms worsen when he got home.  States that he is unable to move his right leg due to the pain.  States that he cannot get around in his home as he lives alone.  Denies any incident that worsen the pain today.  On exam there is tenderness of the right knee but patient fuses to perform any range of motion.  Area appears neurovascularly intact, he has 2+ DP pulse and normal sensation.  No overlying skin changes of the joint, no erythema, edema or warmth of joint.  He is tachycardic here but feel that this could be due to his pain. We will attempt to control his symptoms and reassess. Care and off to oncoming provider pending reassessment.   Portions of this note were generated with Scientist, clinical (histocompatibility and immunogenetics)Dragon dictation software. Dictation errors may occur despite best attempts at proofreading.         Final Clinical Impression(s) / ED Diagnoses Final diagnoses:  Arthritis  Chronic pain of right knee    Rx / DC Orders ED Discharge Orders     None         Dietrich PatesKhatri, Meliah Appleman, PA-C 11/25/21 1903    Alvira MondaySchlossman, Erin, MD 11/26/21 [phone]

## 2021-11-25 NOTE — ED Triage Notes (Signed)
Patient brought in by EMS for right leg pain for 6 months. Today the pain started at 5am. Patient ambulated to the bathroom and was unable to walk out. Patient reports being immobile on the right side. 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

## 2021-11-26 ENCOUNTER — Emergency Department (HOSPITAL_COMMUNITY): Payer: Medicare Other

## 2021-11-26 DIAGNOSIS — R262 Difficulty in walking, not elsewhere classified: Secondary | ICD-10-CM | POA: Diagnosis not present

## 2021-11-26 DIAGNOSIS — I10 Essential (primary) hypertension: Secondary | ICD-10-CM

## 2021-11-26 DIAGNOSIS — M87051 Idiopathic aseptic necrosis of right femur: Secondary | ICD-10-CM

## 2021-11-26 DIAGNOSIS — E119 Type 2 diabetes mellitus without complications: Secondary | ICD-10-CM

## 2021-11-26 LAB — CBC WITH DIFFERENTIAL/PLATELET
Abs Immature Granulocytes: 0.08 10*3/uL — ABNORMAL HIGH (ref 0.00–0.07)
Basophils Absolute: 0 10*3/uL (ref 0.0–0.1)
Basophils Relative: 0 %
Eosinophils Absolute: 0 10*3/uL (ref 0.0–0.5)
Eosinophils Relative: 0 %
HCT: 38.1 % — ABNORMAL LOW (ref 39.0–52.0)
Hemoglobin: 13.2 g/dL (ref 13.0–17.0)
Immature Granulocytes: 1 %
Lymphocytes Relative: 5 %
Lymphs Abs: 0.8 10*3/uL (ref 0.7–4.0)
MCH: 33.2 pg (ref 26.0–34.0)
MCHC: 34.6 g/dL (ref 30.0–36.0)
MCV: 95.7 fL (ref 80.0–100.0)
Monocytes Absolute: 0.4 10*3/uL (ref 0.1–1.0)
Monocytes Relative: 2 %
Neutro Abs: 13.8 10*3/uL — ABNORMAL HIGH (ref 1.7–7.7)
Neutrophils Relative %: 92 %
Platelets: 242 10*3/uL (ref 150–400)
RBC: 3.98 MIL/uL — ABNORMAL LOW (ref 4.22–5.81)
RDW: 13.2 % (ref 11.5–15.5)
WBC: 15 10*3/uL — ABNORMAL HIGH (ref 4.0–10.5)
nRBC: 0 % (ref 0.0–0.2)

## 2021-11-26 LAB — BASIC METABOLIC PANEL
Anion gap: 11 (ref 5–15)
BUN: 19 mg/dL (ref 6–20)
CO2: 24 mmol/L (ref 22–32)
Calcium: 10.4 mg/dL — ABNORMAL HIGH (ref 8.9–10.3)
Chloride: 95 mmol/L — ABNORMAL LOW (ref 98–111)
Creatinine, Ser: 1.17 mg/dL (ref 0.61–1.24)
GFR, Estimated: >60 mL/min (ref >60)
Glucose, Bld: 185 mg/dL — ABNORMAL HIGH (ref 70–99)
Potassium: 4.4 mmol/L (ref 3.5–5.1)
Sodium: 130 mmol/L — ABNORMAL LOW (ref 135–145)

## 2021-11-26 MED ORDER — POLYETHYLENE GLYCOL 3350 17 G PO PACK: 17.0000 g | PACK | Freq: Every day | ORAL | Status: DC | PRN

## 2021-11-26 MED ORDER — OXYCODONE-ACETAMINOPHEN 5-325 MG PO TABS: 1.0000 | ORAL_TABLET | Freq: Four times a day (QID) | ORAL | Status: DC | PRN

## 2021-11-26 MED ORDER — SENNOSIDES-DOCUSATE SODIUM 8.6-50 MG PO TABS
1.0000 | ORAL_TABLET | Freq: Every evening | ORAL | Status: DC | PRN
Start: 2021-11-26 — End: 2021-11-27

## 2021-11-26 MED ORDER — LISINOPRIL 20 MG PO TABS
20.0000 mg | ORAL_TABLET | Freq: Every day | ORAL | Status: DC
Start: 2021-11-26 — End: 2021-11-29
  Administered 2021-11-26 – 2021-11-29 (×4): 20 mg via ORAL
  Filled 2021-11-26 (×5): qty 1

## 2021-11-26 MED ORDER — AMLODIPINE BESYLATE 5 MG PO TABS
5.0000 mg | ORAL_TABLET | Freq: Every day | ORAL | Status: DC
  Administered 2021-11-26 – 2021-11-29 (×4): 5 mg via ORAL
  Filled 2021-11-26 (×4): qty 1

## 2021-11-26 MED ORDER — METFORMIN HCL 500 MG PO TABS
500.0000 mg | ORAL_TABLET | Freq: Two times a day (BID) | ORAL | Status: DC
Start: 2021-11-26 — End: 2021-11-27
  Administered 2021-11-26 (×2): 500 mg via ORAL
  Filled 2021-11-26 (×3): qty 1

## 2021-11-26 MED ORDER — MELOXICAM 7.5 MG PO TABS
15.0000 mg | ORAL_TABLET | Freq: Every day | ORAL | Status: DC
  Administered 2021-11-27 – 2021-11-29 (×3): 15 mg via ORAL
  Filled 2021-11-26 (×4): qty 2

## 2021-11-26 MED ORDER — SODIUM CHLORIDE 0.9 % IV BOLUS
1000.0000 mL | Freq: Once | INTRAVENOUS | Status: AC
  Administered 2021-11-26: 1000 mL via INTRAVENOUS

## 2021-11-26 MED ORDER — BENZTROPINE MESYLATE 0.5 MG PO TABS
0.5000 mg | ORAL_TABLET | Freq: Every day | ORAL | Status: DC
  Administered 2021-11-26 – 2021-12-03 (×8): 0.5 mg via ORAL
  Filled 2021-11-26 (×9): qty 1

## 2021-11-26 MED ORDER — HYDROCHLOROTHIAZIDE 25 MG PO TABS
25.0000 mg | ORAL_TABLET | Freq: Every day | ORAL | Status: DC
  Administered 2021-11-26: 25 mg via ORAL
  Filled 2021-11-26 (×2): qty 1

## 2021-11-26 MED ORDER — OXYCODONE HCL 5 MG PO TABS
5.0000 mg | ORAL_TABLET | Freq: Four times a day (QID) | ORAL | Status: DC | PRN
  Administered 2021-11-26 – 2021-11-28 (×2): 5 mg via ORAL
  Filled 2021-11-26 (×2): qty 1

## 2021-11-26 MED ORDER — TRAZODONE HCL 50 MG PO TABS
100.0000 mg | ORAL_TABLET | Freq: Every day | ORAL | Status: DC
  Administered 2021-11-26 – 2021-12-03 (×7): 100 mg via ORAL
  Filled 2021-11-26 (×8): qty 2

## 2021-11-26 MED ORDER — FLUOXETINE HCL 20 MG PO CAPS
40.0000 mg | ORAL_CAPSULE | Freq: Every morning | ORAL | Status: DC
  Administered 2021-11-27 – 2021-12-04 (×8): 40 mg via ORAL
  Filled 2021-11-26 (×8): qty 2

## 2021-11-26 MED ORDER — METHYLPREDNISOLONE 4 MG PO TBPK: ORAL_TABLET | ORAL | 0 refills | Status: DC

## 2021-11-26 MED ORDER — RISPERIDONE 0.5 MG PO TABS
1.0000 mg | ORAL_TABLET | Freq: Two times a day (BID) | ORAL | Status: DC
  Administered 2021-11-26 – 2021-12-04 (×17): 1 mg via ORAL
  Filled 2021-11-26: qty 2
  Filled 2021-11-26: qty 1
  Filled 2021-11-26 (×2): qty 2
  Filled 2021-11-26: qty 1
  Filled 2021-11-26: qty 2
  Filled 2021-11-26: qty 1
  Filled 2021-11-26 (×12): qty 2

## 2021-11-26 MED ORDER — LISINOPRIL-HYDROCHLOROTHIAZIDE 20-25 MG PO TABS: 1.0000 | ORAL_TABLET | Freq: Every day | ORAL | Status: DC

## 2021-11-26 MED ORDER — ACETAMINOPHEN 500 MG PO TABS: 500.0000 mg | ORAL_TABLET | Freq: Four times a day (QID) | ORAL | Status: DC | PRN

## 2021-11-26 MED ORDER — METFORMIN HCL 500 MG PO TABS: 500.0000 mg | ORAL_TABLET | ORAL | Status: DC

## 2021-11-26 MED ORDER — KETOROLAC TROMETHAMINE 30 MG/ML IJ SOLN: 30.0000 mg | Freq: Four times a day (QID) | INTRAMUSCULAR | Status: DC | PRN

## 2021-11-26 MED ORDER — ENOXAPARIN SODIUM 40 MG/0.4ML IJ SOSY
40.0000 mg | PREFILLED_SYRINGE | INTRAMUSCULAR | Status: DC
  Administered 2021-11-26 – 2021-11-27 (×2): 40 mg via SUBCUTANEOUS
  Filled 2021-11-26 (×2): qty 0.4

## 2021-11-26 MED ORDER — BISACODYL 5 MG PO TBEC
5.0000 mg | DELAYED_RELEASE_TABLET | Freq: Every day | ORAL | Status: DC | PRN
  Administered 2021-11-26 – 2021-11-27 (×2): 5 mg via ORAL
  Filled 2021-11-26 (×3): qty 1

## 2021-11-26 NOTE — ED Provider Notes (Signed)
I assumed care of this patient.  Please see previous provider note for further details of Hx, PE.  Briefly patient is a 61 y.o. male who presented for knee pain pending lumbar and hip MRI  No emergent findings. Mild stenosis and degenerative changes noted.  The patient appears reasonably screened and/or stabilized for discharge and I doubt any other medical condition or other [facility] requiring further screening, evaluation, or treatment in the ED at this time prior to discharge. Safe for discharge with strict return precautions.  Disposition: Discharge  Condition: Good  I have discussed the results, Dx and Tx plan with the patient/family who expressed understanding and agree(s) with the plan. Discharge instructions discussed at length. The patient/family was given strict return precautions who verbalized understanding of the instructions. No further questions at time of discharge.    ED Discharge Orders          Ordered    methylPREDNISolone (MEDROL DOSEPAK) 4 MG TBPK tablet        11/26/21 3154            Follow Up: Orthopedic surgery  Call  to schedule an appointment for close follow up  Sheppard Plumber, MD  Call  to schedule an appointment for close follow up  Tia Alert, MD [address] [place] [address] [phone]  Call  as needed            Nira Conn, MD 11/26/21 [phone]

## 2021-11-26 NOTE — Discharge Instructions (Addendum)
Follow with Primary [facility], [facility] in 7 days   Get CBC, CMP, magnesium, 2 view Chest X ray -  checked next visit within 1 week by   SNF MD    Activity: weight bearing as tolerated with Full fall precautions use walker/cane & assistance as needed  Disposition SNF  Diet: Heart Healthy-low carbohydrate, check CBGs q. [facility]  Special Instructions: If you have smoked or chewed Tobacco  in the last 2 yrs please stop smoking, stop any regular Alcohol  and or any Recreational drug use.  On your next visit with your primary care physician please Get Medicines reviewed and adjusted.  Please request your Prim.MD to go over all Hospital Tests and Procedure/Radiological results at the follow up, please get all Hospital records sent to your Prim MD by signing hospital release before you go home.  If you experience worsening of your admission symptoms, develop shortness of breath, life threatening emergency, suicidal or homicidal thoughts you must seek medical attention immediately by calling 911 or calling your MD immediately  if symptoms less severe.  You Must read complete instructions/literature along with all the possible adverse reactions/side effects for all the Medicines you take and that have been prescribed to you. Take any new Medicines after you have completely understood and accpet all the possible adverse reactions/side effects.       TOTAL HIP REPLACEMENT POSTOPERATIVE DIRECTIONS    Hip Rehabilitation, Guidelines Following Surgery   WEIGHT BEARING Weight bearing as tolerated with assist device (walker, cane, etc) as directed, use it as long as suggested by your surgeon or therapist, typically at least 4-6 weeks.  The results of a hip operation are greatly improved after range of motion and muscle strengthening exercises. Follow all safety measures which are given to protect your hip. If any of these exercises cause increased pain or swelling in your joint, decrease the  amount until you are comfortable again. Then slowly increase the exercises. Call your caregiver if you have problems or questions.   HOME CARE INSTRUCTIONS  Most of the following instructions are designed to prevent the dislocation of your new hip.  Remove items at home which could result in a fall. This includes throw rugs or furniture in walking pathways.  Continue medications as instructed at time of discharge. You may have some home medications which will be placed on hold until you complete the course of blood thinner medication. You may start showering once you are discharged home. Do not remove your dressing. Do not put on socks or shoes without following the instructions of your caregivers.   Sit on chairs with arms. Use the chair arms to help push yourself up when arising.  Arrange for the use of a toilet seat elevator so you are not sitting low.  Walk with walker as instructed.  You may resume a sexual relationship in one month or when given the OK by your caregiver.  Use walker as long as suggested by your caregivers.  You may put full weight on your legs and walk as much as is comfortable. Avoid periods of inactivity such as sitting longer than an hour when not asleep. This helps prevent blood clots.  You may return to work once you are cleared by Designer, industrial/product.  Do not drive a car for 6 weeks or until released by your surgeon.  Do not drive while taking narcotics.  Wear elastic stockings for two weeks following surgery during the day but you may remove then  at night.  Make sure you keep all of your appointments after your operation with all of your doctors and caregivers. You should call the office at the above phone number and make an appointment for approximately two weeks after the date of your surgery. Please pick up a stool softener and laxative for home use as long as you are requiring pain medications. ICE to the affected hip every three hours for 30 minutes at a time and  then as needed for pain and swelling. Continue to use ice on the hip for pain and swelling from surgery. You may notice swelling that will progress down to the foot and ankle.  This is normal after surgery.  Elevate the leg when you are not up walking on it.   It is important for you to complete the blood thinner medication as prescribed by your doctor. Continue to use the breathing machine which will help keep your temperature down.  It is common for your temperature to cycle up and down following surgery, especially at night when you are not up moving around and exerting yourself.  The breathing machine keeps your lungs expanded and your temperature down.  RANGE OF MOTION AND STRENGTHENING EXERCISES  These exercises are designed to help you keep full movement of your hip joint. Follow your caregiver's or physical therapist's instructions. Perform all exercises about fifteen times, three times per day or as directed. Exercise both hips, even if you have had only one joint replacement. These exercises can be done on a training (exercise) mat, on the floor, on a table or on a bed. Use whatever works the best and is most comfortable for you. Use music or television while you are exercising so that the exercises are a pleasant break in your day. This will make your life better with the exercises acting as a break in routine you can look forward to.  Lying on your back, slowly slide your foot toward your buttocks, raising your knee up off the floor. Then slowly slide your foot back down until your leg is straight again.  Lying on your back spread your legs as far apart as you can without causing discomfort.  Lying on your side, raise your upper leg and foot straight up from the floor as far as is comfortable. Slowly lower the leg and repeat.  Lying on your back, tighten up the muscle in the front of your thigh (quadriceps muscles). You can do this by keeping your leg straight and trying to raise your heel off  the floor. This helps strengthen the largest muscle supporting your knee.  Lying on your back, tighten up the muscles of your buttocks both with the legs straight and with the knee bent at a comfortable angle while keeping your heel on the floor.   SKILLED REHAB INSTRUCTIONS: If the patient is transferred to a skilled rehab facility following release from the hospital, a list of the current medications will be sent to the facility for the patient to continue.  When discharged from the skilled rehab facility, please have the facility set up the patient's Home Health Physical Therapy prior to being released. Also, the skilled facility will be responsible for providing the patient with their medications at time of release from the facility to include their pain medication and their blood thinner medication. If the patient is still at the rehab facility at time of the two week follow up appointment, the skilled rehab facility will also need to assist the patient  in arranging follow up appointment in our office and any transportation needs.  POST-OPERATIVE OPIOID TAPER INSTRUCTIONS: It is important to wean off of your opioid medication as soon as possible. If you do not need pain medication after your surgery it is ok to stop day one. Opioids include: Codeine, Hydrocodone(Norco, Vicodin), Oxycodone(Percocet, oxycontin) and hydromorphone amongst others.  Long term and even short term use of opiods can cause: Increased pain response Dependence Constipation Depression Respiratory depression And more.  Withdrawal symptoms can include Flu like symptoms Nausea, vomiting And more Techniques to manage these symptoms Hydrate well Eat regular healthy meals Stay active Use relaxation techniques(deep breathing, meditating, yoga) Do Not substitute Alcohol to help with tapering If you have been on opioids for less than two weeks and do not have pain than it is ok to stop all together.  Plan to wean off of  opioids This plan should start within one week post op of your joint replacement. Maintain the same interval or time between taking each dose and first decrease the dose.  Cut the total daily intake of opioids by one tablet each day Next start to increase the time between doses. The last dose that should be eliminated is the evening dose.    MAKE SURE YOU:  Understand these instructions.  Will watch your condition.  Will get help right away if you are not doing well or get worse.  Pick up stool softner and laxative for home use following surgery while on pain medications. Do not remove your dressing. The dressing is waterproof--it is OK to take showers. Continue to use ice for pain and swelling after surgery. Do not use any lotions or creams on the incision until instructed by your surgeon. Total Hip Protocol.

## 2021-11-26 NOTE — ED Notes (Signed)
Patient being transported to MRI

## 2021-11-26 NOTE — ED Notes (Signed)
Pt to MRI

## 2021-11-26 NOTE — ED Provider Notes (Signed)
This was a 61 year old who had been pending discharge home at the start of my shift, after complaint of chronic knee and hip pain, reporting he could not bear weight at home.  He been diagnosed with arthritis.  MRI of the lumbar spine shows some degenerative disc disease, no cauda equina syndrome.  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  I strongly suspect this is the cause of his pain.    I discussed these findings with Earney Hamburg from orthopedics who advised that the patient could be weightbearing as tolerated, but would need outpatient follow-up with a hip specialist, and likely would need an eventual hip replacement.  Dr Kyra Leyland information added to patient disposition page.  Given that the patient cannot bear weight due to pain, I have asked her admission labs to be gathered, and anticipate medical admission for PT evaluation and possible rehab placement.  The patient also was a difficult social situation, states that he is essentially homeless, as the person he was staying with states he could no longer stay there, since he is not on the lease.  12 pm - admitted to Dr Chipper Herb hospitalist   Renaye Rakers, Kermit Balo, MD 11/26/21 [phone]

## 2021-11-26 NOTE — H&P (Signed)
History and Physical    Sean [name] [id number] DOB: 1960-07-15 DOA: 11/25/2021  PCP: Harvie Bridge, MD (Confirm with patient/family/NH records and if not entered, this has to be entered at [facility] point of entry) Patient coming from: Home  I have personally briefly reviewed patient's old medical records in [place]  Chief Complaint: Right hip pain  HPI: Sean [name] is a 61 y.o. male with medical history significant of multijoint OA, HTN, IIDM, morbid obesity, anxiety/depression, presented with worsening of baseline ambulation problem.  Patient has chronic hip pain and knee pain, was just recently evaluated by PCP about 6 months ago and was started on narcotics.  Patient was also referred to see orthopedic surgery but he has not called to set up appointment yet.  At baseline, patient uses cane to ambulate.  But last 3 days, patient has had uncontrolled right hip pain and unable to use the cane anymore and he has been bedbound multiple times.  Denies any fall, denies any weakness or numbness of the right leg.  No fever or chills.  Patient called his PCP yesterday and was ordered Medrol pack but he has not picked up.  ED Course: Vital signs stable, MRI of the hip found patient has avascular necrosis of bilateral hips and subchondral fracture, no subchondral fracture on the left hip.  Image was reviewed by orthopedic surgery and recommend conservative management and outpatient follow-up for hip replacement.  Review of Systems: As per HPI otherwise 14 point review of systems negative.    Past Medical History:  Diagnosis Date   Arthritis    Depression    Diabetes mellitus without complication ([place])    Hypertension    Psychiatric problem     Past Surgical History:  Procedure Laterality Date   OTHER SURGICAL HISTORY     reports surgery on his head as a child- he is not sure why     reports that he has quit smoking. He does not have any smokeless tobacco history on file. He  reports that he does not drink alcohol. No history on file for drug use.  No Known Allergies  Family History  Family history unknown: Yes     Prior to Admission medications   Medication Sig Start Date End Date Taking? Authorizing Provider  methylPREDNISolone (MEDROL DOSEPAK) 4 MG TBPK tablet Use as directed on the package 11/26/21  Yes Cardama, Grayce Sessions, MD  Acetaminophen (TYLENOL PO) Take 1 tablet by mouth 2 (two) times daily as needed (pain).    [provider]  amoxicillin-clavulanate (AUGMENTIN) 875-125 MG tablet SMARTSIG:1 Tablet(s) By Mouth Every 12 Hours Patient not taking: Reported on 11/25/2021 11/03/21   [provider]  benztropine (COGENTIN) 0.5 MG tablet Take 0.5-1 mg by mouth at bedtime. 10/01/21   [provider]  diclofenac (VOLTAREN) 50 MG EC tablet Take 1 tablet (50 mg total) by mouth 2 (two) times daily for 7 days. 11/25/21 12/02/21  Smoot, Leary Roca, PA-C  FLUoxetine (PROZAC) 40 MG capsule Take 40 mg by mouth every morning. 10/01/21   [provider]  gabapentin (NEURONTIN) 400 MG capsule Take 400 mg by mouth daily. 10/24/21   [provider]  lisinopril-hydrochlorothiazide (ZESTORETIC) 20-25 MG tablet Take 1 tablet by mouth daily. 10/24/21   [provider]  meloxicam (MOBIC) 15 MG tablet Take 15 mg by mouth daily. 10/24/21   [provider]  Menthol, Topical Analgesic, (ICY HOT EX) Apply 1 application. topically 2 (two) times daily as  needed (pain).    [provider]  metFORMIN (GLUCOPHAGE) 500 MG tablet Take 500 mg by mouth See admin instructions. Take 500 mg by mouth 1-2 times a day 08/11/21   [provider]  methocarbamol (ROBAXIN) 500 MG tablet Take 1 tablet (500 mg total) by mouth 2 (two) times daily. 11/22/21   Dorothyann Peng, PA-C  omeprazole (PRILOSEC) 20 MG capsule Take 20 mg by mouth daily. Patient not taking: Reported on 11/25/2021 09/12/21   [provider]   oxyCODONE-acetaminophen (PERCOCET/ROXICET) 5-325 MG tablet Take 1 tablet by mouth every 6 (six) hours as needed for severe pain. 11/25/21   Smoot, Sarah A, PA-C  risperiDONE (RISPERDAL) 1 MG tablet Take 1 mg by mouth See admin instructions. 1 mg by mouth 1-2 times a day 10/01/21   [provider]  traZODone (DESYREL) 100 MG tablet Take 100 mg by mouth at bedtime. 10/01/21   [provider]  Vitamin D, Ergocalciferol, (DRISDOL) 1.25 MG (50000 UNIT) CAPS capsule Take 50,000 Units by mouth once a week. Patient not taking: Reported on 11/25/2021 10/24/21   [provider]    Physical Exam: Vitals:   11/26/21 1100 11/26/21 1130 11/26/21 1200 11/26/21 1230  BP: 119/80 114/81 109/83 (!) 122/105  Pulse: 80 81 83 97  Resp: (!) 23 17 18  (!) 23  Temp:      TempSrc:      SpO2: 94% 95% 95% 97%  Weight:      Height:        Constitutional: NAD, calm, comfortable Vitals:   11/26/21 1100 11/26/21 1130 11/26/21 1200 11/26/21 1230  BP: 119/80 114/81 109/83 (!) 122/105  Pulse: 80 81 83 97  Resp: (!) 23 17 18  (!) 23  Temp:      TempSrc:      SpO2: 94% 95% 95% 97%  Weight:      Height:       Eyes: PERRL, lids and conjunctivae normal ENMT: Mucous membranes are moist. Posterior pharynx clear of any exudate or lesions.Normal dentition.  Neck: normal, supple, no masses, no thyromegaly Respiratory: clear to auscultation bilaterally, no wheezing, no crackles. Normal respiratory effort. No accessory muscle use.  Cardiovascular: Regular rate and rhythm, no murmurs / rubs / gallops. No extremity edema. 2+ pedal pulses. No carotid bruits.  Abdomen: no tenderness, no masses palpated. No hepatosplenomegaly. Bowel sounds positive.  Musculoskeletal: Reduced ROM of bilateral hips Skin: no rashes, lesions, ulcers. No induration Neurologic: CN 2-12 grossly intact. Sensation intact, DTR normal. Strength 5/5 in all 4.  Psychiatric: Normal judgment and insight. Alert and oriented x 3. Normal  mood.     Labs on Admission: I have personally reviewed following labs and imaging studies  CBC: Recent Labs  Lab 11/22/21 1347 11/26/21 0923  WBC 9.7 15.0*  NEUTROABS  --  13.8*  HGB 11.8* 13.2  HCT 34.7* 38.1*  MCV 96.7 95.7  PLT 268 [id number]   Basic Metabolic Panel: Recent Labs  Lab 11/22/21 1347 11/26/21 0923  NA 133* 130*  K 4.3 4.4  CL 102 95*  CO2 26 24  GLUCOSE 119* 185*  BUN 24* 19  CREATININE 1.25* 1.17  CALCIUM 9.3 10.4*   GFR: Estimated Creatinine Clearance: 85.7 mL/min (by C-G formula based on SCr of 1.17 mg/dL). Liver Function Tests: No results for input(s): AST, ALT, ALKPHOS, BILITOT, PROT, ALBUMIN in the last 168 hours. No results for input(s): LIPASE, AMYLASE in the last 168 hours. No results for input(s): AMMONIA in the last  168 hours. Coagulation Profile: No results for input(s): INR, PROTIME in the last 168 hours. Cardiac Enzymes: No results for input(s): CKTOTAL, CKMB, CKMBINDEX, TROPONINI in the last 168 hours. BNP (last 3 results) No results for input(s): PROBNP in the last 8760 hours. HbA1C: No results for input(s): HGBA1C in the last 72 hours. CBG: No results for input(s): GLUCAP in the last 168 hours. Lipid Profile: No results for input(s): CHOL, HDL, LDLCALC, TRIG, CHOLHDL, LDLDIRECT in the last 72 hours. Thyroid Function Tests: No results for input(s): TSH, T4TOTAL, FREET4, T3FREE, THYROIDAB in the last 72 hours. Anemia Panel: No results for input(s): VITAMINB12, FOLATE, FERRITIN, TIBC, IRON, RETICCTPCT in the last 72 hours. Urine analysis: No results found for: COLORURINE, APPEARANCEUR, LABSPEC, PHURINE, GLUCOSEU, HGBUR, BILIRUBINUR, KETONESUR, PROTEINUR, UROBILINOGEN, NITRITE, LEUKOCYTESUR  Radiological Exams on Admission: MR LUMBAR SPINE WO CONTRAST  Result Date: 11/26/2021 CLINICAL DATA:  61 year old male with low back pain radiating to the right leg. Weakness. Difficulty ambulating. EXAM: MRI LUMBAR SPINE WITHOUT CONTRAST  TECHNIQUE: Multiplanar, multisequence MR imaging of the lumbar spine was performed. No intravenous contrast was administered. COMPARISON:  Thoracolumbar radiographs 02/03/2010. FINDINGS: Segmentation:  Normal on the comparison radiographs. Alignment: Relatively stable lumbar lordosis since 2011. No significant spondylolisthesis. Vertebrae: No marrow edema or evidence of acute osseous abnormality. Visualized bone marrow signal is within normal limits. Intact visible sacrum and SI joints. Conus medullaris and cauda equina: Conus extends to the L1 level. No lower spinal cord or conus signal abnormality. Paraspinal and other soft tissues: Conspicuous asymmetry of the psoas musculature (series 9, image 24) which appears hypertrophied on the left, smaller but not definitely atrophied on the right. The posterior paraspinal muscles appear more symmetric. Negative visible abdominal viscera. No paraspinal soft tissue inflammation identified. Disc levels: T11-T12: Negative. T12-L1:  Subtle disc bulging.  No stenosis. L1-L2: Mild disc space loss with circumferential disc bulging. Mild posterior element hypertrophy. No spinal or lateral recess stenosis. Mild L1 neural foraminal stenosis greater on the right. L2-L3: Negative disc. Mild to moderate facet hypertrophy. No significant stenosis. L3-L4: Mild disc space loss and circumferential disc bulge. Mild to moderate facet and ligament flavum hypertrophy. No spinal or lateral recess stenosis. Mild L3 foraminal stenosis mostly on the left. L4-L5: Mild circumferential disc bulge. Small posterior annular fissure in the midline. Moderate facet and ligament flavum hypertrophy. Degenerative facet joint fluid. Epidural lipomatosis. Borderline to mild spinal stenosis. No lateral recess stenosis. Mild to moderate bilateral L4 neural foraminal stenosis appears fairly symmetric. L5-S1: Disc desiccation. Circumferential disc bulge and endplate spurring. Broad-based posterior and biforaminal  involvement. Moderate facet hypertrophy. No spinal or lateral recess stenosis. But moderate to severe bilateral L5 foraminal stenosis, perhaps greater on the left. IMPRESSION: 1. Bulky L5-S1 circumferential disc osteophyte complex and moderate facet degeneration. No spinal or lateral recess stenosis but moderate to severe bilateral neural foraminal stenosis. Query L5 radiculitis. 2. L4-L5 mild disc degeneration but moderate bilateral facet arthropathy. Borderline to mild multifactorial spinal stenosis and mild to moderate bilateral L4 foraminal stenosis. 3. Elsewhere generally mild for age lumbar disc degeneration. No acute osseous abnormality in the lumbosacral spine. Electronically Signed   By: Genevie Ann M.D.   On: 11/26/2021 05:18   MR HIP RIGHT WO CONTRAST  Result Date: 11/26/2021 CLINICAL DATA:  Right leg pain for 6 months EXAM: MR OF THE RIGHT HIP WITHOUT CONTRAST TECHNIQUE: Multiplanar, multisequence MR imaging was performed. No intravenous contrast was administered. COMPARISON:  X-ray 02/03/2010 FINDINGS: Technical note: Incomplete examination. Patient was unable  to tolerate further imaging. Best possible images were obtained and submitted for interpretation. 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 There is marked flattening of the femoral head and remodeling of the acetabulum. Numerous subchondral cysts within the acetabulum. Extensive bone marrow edema within the residual femoral head and extending into the femoral neck. Reactive bone marrow edema throughout the right acetabulum. Small complex right hip joint effusion, likely reactive. No dislocation. Small focus of avascular necrosis involving the left femoral head. No associated subchondral fracture or subchondral collapse. Left femoral head contour is maintained. Mild  osteoarthritis of the left hip. Tearing of the superior left labrum. Lobulated 4.3 x 2.1 x 2.9 cm cyst along the posterior margin of the left acetabulum, which could represent a paralabral cyst or ganglion. Bony pelvis intact. Mild arthropathy of the pubic symphysis. No suspicious marrow replacing bone lesion. Ligaments Grossly intact. Muscles and Tendons No acute musculotendinous abnormality on limited exam. Small volume right peritrochanteric bursal fluid. Soft tissues Fat containing right inguinal hernia. No mass or fluid collection. Mild prostatomegaly. IMPRESSION: 1. Incomplete examination. See above. 2. 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 Large subchondral fracture of the right femoral head superiorly. Extensive bone marrow edema within the femoral head and neck as well as within the right acetabulum. Small complex right hip joint effusion, likely reactive. 3. Small focus of avascular necrosis involving the left femoral head without associated subchondral fracture or subchondral collapse. 4. Mild osteoarthritis of the left hip. Tearing of the superior left labrum. Lobulated 4.3 x 2.1 x 2.9 cm cyst along the posterior margin of the left acetabulum, which could represent a paralabral cyst or ganglion. 5. Mild prostatomegaly. Electronically Signed   By: Davina Poke D.O.   On: 11/26/2021 08:15   DG Knee Complete 4 Views Right  Result Date: 11/25/2021 CLINICAL DATA:  61 year old male with history of chronic right knee pain. EXAM: RIGHT KNEE - COMPLETE 4+ VIEW COMPARISON:  No priors. FINDINGS: Four views of the right knee demonstrate no acute displaced fracture, subluxation or dislocation. There is joint space narrowing, subchondral sclerosis, subchondral cyst formation and osteophyte formation in a tricompartmental distribution, indicative of osteoarthritis. IMPRESSION: 1. No acute radiographic abnormality of  the right knee. 2. Tricompartmental osteoarthritis, as above. Electronically Signed   By: Vinnie Langton M.D.   On: 11/25/2021 07:28    EKG: None  Assessment/Plan Principal Problem:   Impaired ambulation Active Problems:   Avascular necrosis of bone of hip, right (HCC)   Controlled type 2 diabetes mellitus without complication, without long-term current use of insulin (HCC)   HTN (hypertension)   Obesity, Class III, BMI 40-49.9 (morbid obesity) ([place])  (please populate well all problems here in Problem List. (For example, if patient is on BP meds at home and you resume or decide to hold them, it is a problem that needs to be her. 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  There was also evidence showing subchondral fracture of the right hip, and imaging was reviewed by orthopedic surgery and recommended outpatient orthopedic follow-up for elective hip replacement.  Pain management, PT evaluation for now.  As per orthopedic recommendation, weightbearing as tolerated on bilateral  hips.  Explained to the patient who expressed understanding and agreed.  Oxycodone alternated with Toradol. -PT evaluation -Contact patient pharmacy [facility], appears that the patient was supposed to start Medrol pack for 7 days, however given that image finding compatible with avascular necrosis, I recommend to hold off steroid.  Hyponatremia -Euvolemic, likely related to HCTZ, change HCTZ to amlodipine.  Recheck level tomorrow.  Leukocytosis -Denies any cough, no urinary symptoms no diarrhea.  Monitor off antibiotics.  Likely related to pain and stressed.  Recheck CBC tomorrow.  HTN -Continue lisinopril, change HCTZ to amlodipine.  IIDM -Continue metformin  Morbid obesity -BMI= 45, recommend calorie control.  Anxiety/depression -Continue trazodone, continue risperidone.  DVT prophylaxis: Lovenox Code Status:  Full code Family Communication: None at bedside Disposition Plan: Expect less than 2 midnight hospital stay Consults called: None Admission status: MedSurg observation   Lequita Halt MD [facility] Pager [phone]  11/26/2021, 1:56 PM

## 2021-11-27 ENCOUNTER — Inpatient Hospital Stay (HOSPITAL_COMMUNITY): Payer: Medicare Other

## 2021-11-27 DIAGNOSIS — Z791 Long term (current) use of non-steroidal anti-inflammatories (NSAID): Secondary | ICD-10-CM | POA: Diagnosis not present

## 2021-11-27 DIAGNOSIS — M8700 Idiopathic aseptic necrosis of unspecified bone: Secondary | ICD-10-CM

## 2021-11-27 DIAGNOSIS — M879 Osteonecrosis, unspecified: Secondary | ICD-10-CM | POA: Diagnosis present

## 2021-11-27 DIAGNOSIS — N179 Acute kidney failure, unspecified: Secondary | ICD-10-CM | POA: Diagnosis not present

## 2021-11-27 DIAGNOSIS — E119 Type 2 diabetes mellitus without complications: Secondary | ICD-10-CM

## 2021-11-27 DIAGNOSIS — I10 Essential (primary) hypertension: Secondary | ICD-10-CM | POA: Diagnosis present

## 2021-11-27 DIAGNOSIS — D62 Acute posthemorrhagic anemia: Secondary | ICD-10-CM | POA: Diagnosis not present

## 2021-11-27 DIAGNOSIS — Z79899 Other long term (current) drug therapy: Secondary | ICD-10-CM | POA: Diagnosis not present

## 2021-11-27 DIAGNOSIS — E871 Hypo-osmolality and hyponatremia: Secondary | ICD-10-CM | POA: Diagnosis present

## 2021-11-27 DIAGNOSIS — I159 Secondary hypertension, unspecified: Secondary | ICD-10-CM

## 2021-11-27 DIAGNOSIS — Z86711 Personal history of pulmonary embolism: Secondary | ICD-10-CM | POA: Diagnosis not present

## 2021-11-27 DIAGNOSIS — S72091A Other fracture of head and neck of right femur, initial encounter for closed fracture: Secondary | ICD-10-CM | POA: Diagnosis present

## 2021-11-27 DIAGNOSIS — F419 Anxiety disorder, unspecified: Secondary | ICD-10-CM | POA: Diagnosis present

## 2021-11-27 DIAGNOSIS — E875 Hyperkalemia: Secondary | ICD-10-CM | POA: Diagnosis not present

## 2021-11-27 DIAGNOSIS — R338 Other retention of urine: Secondary | ICD-10-CM | POA: Diagnosis not present

## 2021-11-27 DIAGNOSIS — Z87891 Personal history of nicotine dependence: Secondary | ICD-10-CM | POA: Diagnosis not present

## 2021-11-27 DIAGNOSIS — Z5902 Unsheltered homelessness: Secondary | ICD-10-CM | POA: Diagnosis not present

## 2021-11-27 DIAGNOSIS — E860 Dehydration: Secondary | ICD-10-CM | POA: Diagnosis present

## 2021-11-27 DIAGNOSIS — M1711 Unilateral primary osteoarthritis, right knee: Secondary | ICD-10-CM | POA: Diagnosis present

## 2021-11-27 DIAGNOSIS — M25751 Osteophyte, right hip: Secondary | ICD-10-CM | POA: Diagnosis present

## 2021-11-27 DIAGNOSIS — Z6841 Body Mass Index (BMI) 40.0 and over, adult: Secondary | ICD-10-CM | POA: Diagnosis not present

## 2021-11-27 DIAGNOSIS — Z7984 Long term (current) use of oral hypoglycemic drugs: Secondary | ICD-10-CM | POA: Diagnosis not present

## 2021-11-27 DIAGNOSIS — Z86718 Personal history of other venous thrombosis and embolism: Secondary | ICD-10-CM | POA: Diagnosis not present

## 2021-11-27 DIAGNOSIS — F32A Depression, unspecified: Secondary | ICD-10-CM | POA: Diagnosis present

## 2021-11-27 DIAGNOSIS — M8568 Other cyst of bone, other site: Secondary | ICD-10-CM | POA: Diagnosis present

## 2021-11-27 DIAGNOSIS — R262 Difficulty in walking, not elsewhere classified: Secondary | ICD-10-CM | POA: Diagnosis not present

## 2021-11-27 LAB — BASIC METABOLIC PANEL
Anion gap: 8 (ref 5–15)
BUN: 27 mg/dL — ABNORMAL HIGH (ref 6–20)
CO2: 24 mmol/L (ref 22–32)
Calcium: 9.9 mg/dL (ref 8.9–10.3)
Chloride: 99 mmol/L (ref 98–111)
Creatinine, Ser: 1.2 mg/dL (ref 0.61–1.24)
GFR, Estimated: >60 mL/min (ref >60)
Glucose, Bld: 134 mg/dL — ABNORMAL HIGH (ref 70–99)
Potassium: 4.6 mmol/L (ref 3.5–5.1)
Sodium: 131 mmol/L — ABNORMAL LOW (ref 135–145)

## 2021-11-27 LAB — GLUCOSE, CAPILLARY
Glucose-Capillary: 158 mg/dL — ABNORMAL HIGH (ref 70–99)
Glucose-Capillary: 149 mg/dL — ABNORMAL HIGH (ref 70–99)
Glucose-Capillary: 218 mg/dL — ABNORMAL HIGH (ref 70–99)
Glucose-Capillary: 126 mg/dL — ABNORMAL HIGH (ref 70–99)

## 2021-11-27 LAB — CBC
HCT: 32.6 % — ABNORMAL LOW (ref 39.0–52.0)
Hemoglobin: 11.2 g/dL — ABNORMAL LOW (ref 13.0–17.0)
MCH: 32.3 pg (ref 26.0–34.0)
MCHC: 34.4 g/dL (ref 30.0–36.0)
MCV: 93.9 fL (ref 80.0–100.0)
Platelets: 259 10*3/uL (ref 150–400)
RBC: 3.47 MIL/uL — ABNORMAL LOW (ref 4.22–5.81)
RDW: 13.2 % (ref 11.5–15.5)
WBC: 18.6 10*3/uL — ABNORMAL HIGH (ref 4.0–10.5)
nRBC: 0 % (ref 0.0–0.2)

## 2021-11-27 LAB — URINALYSIS, ROUTINE W REFLEX MICROSCOPIC
Bilirubin Urine: NEGATIVE
Glucose, UA: NEGATIVE mg/dL
Ketones, ur: NEGATIVE mg/dL
Leukocytes,Ua: NEGATIVE
Nitrite: NEGATIVE
Protein, ur: NEGATIVE mg/dL
Specific Gravity, Urine: 1.019 (ref 1.005–1.030)
pH: 5 (ref 5.0–8.0)

## 2021-11-27 LAB — HIV ANTIBODY (ROUTINE TESTING W REFLEX): HIV Screen 4th Generation wRfx: NONREACTIVE

## 2021-11-27 MED ORDER — POVIDONE-IODINE 10 % EX SWAB: 2.0000 "application " | Freq: Once | CUTANEOUS | Status: DC

## 2021-11-27 MED ORDER — ONDANSETRON HCL 4 MG/2ML IJ SOLN: 4.0000 mg | Freq: Four times a day (QID) | INTRAMUSCULAR | Status: DC | PRN

## 2021-11-27 MED ORDER — FLEET ENEMA 7-19 GM/118ML RE ENEM: 1.0000 | ENEMA | Freq: Every day | RECTAL | Status: DC | PRN

## 2021-11-27 MED ORDER — SENNOSIDES-DOCUSATE SODIUM 8.6-50 MG PO TABS: 1.0000 | ORAL_TABLET | Freq: Every evening | ORAL | Status: DC | PRN

## 2021-11-27 MED ORDER — CHLORHEXIDINE GLUCONATE 4 % EX LIQD
60.0000 mL | Freq: Once | CUTANEOUS | Status: AC
Start: 2021-11-28 — End: 2021-11-28
  Administered 2021-11-28: 4 via TOPICAL
  Filled 2021-11-27: qty 60

## 2021-11-27 MED ORDER — CEFAZOLIN IN SODIUM CHLORIDE 3-0.9 GM/100ML-% IV SOLN
3.0000 g | INTRAVENOUS | Status: AC
  Administered 2021-11-28: 3 g via INTRAVENOUS
  Filled 2021-11-27: qty 100

## 2021-11-27 MED ORDER — TRANEXAMIC ACID-NACL 1000-0.7 MG/100ML-% IV SOLN
1000.0000 mg | INTRAVENOUS | Status: AC
  Administered 2021-11-28: 1000 mg via INTRAVENOUS
  Filled 2021-11-27: qty 100

## 2021-11-27 MED ORDER — ACETAMINOPHEN 500 MG PO TABS
1000.0000 mg | ORAL_TABLET | Freq: Three times a day (TID) | ORAL | Status: DC
  Administered 2021-11-27 – 2021-11-28 (×4): 1000 mg via ORAL
  Filled 2021-11-27 (×4): qty 2

## 2021-11-27 MED ORDER — POLYETHYLENE GLYCOL 3350 17 G PO PACK
17.0000 g | PACK | Freq: Every day | ORAL | Status: DC
  Administered 2021-11-27 – 2021-12-02 (×5): 17 g via ORAL
  Filled 2021-11-27 (×6): qty 1

## 2021-11-27 MED ORDER — ALBUTEROL SULFATE (2.5 MG/3ML) 0.083% IN NEBU: 2.5000 mg | INHALATION_SOLUTION | Freq: Four times a day (QID) | RESPIRATORY_TRACT | Status: DC | PRN

## 2021-11-27 MED ORDER — MORPHINE SULFATE (PF) 2 MG/ML IV SOLN: 1.0000 mg | INTRAVENOUS | Status: DC | PRN

## 2021-11-27 MED ORDER — INSULIN ASPART 100 UNIT/ML IJ SOLN
0.0000 [IU] | Freq: Three times a day (TID) | INTRAMUSCULAR | Status: DC
  Administered 2021-11-27: 3 [IU] via SUBCUTANEOUS
  Administered 2021-11-27: 2 [IU] via SUBCUTANEOUS
  Administered 2021-11-27 – 2021-11-28 (×2): 1 [IU] via SUBCUTANEOUS
  Administered 2021-11-29 (×2): 3 [IU] via SUBCUTANEOUS
  Administered 2021-11-29: 2 [IU] via SUBCUTANEOUS
  Administered 2021-11-30: 1 [IU] via SUBCUTANEOUS
  Administered 2021-11-30: 2 [IU] via SUBCUTANEOUS
  Administered 2021-11-30 – 2021-12-01 (×2): 1 [IU] via SUBCUTANEOUS
  Administered 2021-12-01: 2 [IU] via SUBCUTANEOUS
  Administered 2021-12-01: 1 [IU] via SUBCUTANEOUS
  Administered 2021-12-02: 2 [IU] via SUBCUTANEOUS
  Administered 2021-12-02: 1 [IU] via SUBCUTANEOUS
  Administered 2021-12-02: 2 [IU] via SUBCUTANEOUS
  Administered 2021-12-03 (×2): 1 [IU] via SUBCUTANEOUS
  Administered 2021-12-03: 2 [IU] via SUBCUTANEOUS
  Administered 2021-12-04: 1 [IU] via SUBCUTANEOUS

## 2021-11-27 NOTE — Consult Note (Signed)
Reason for Consult:Right hip AVN Referring Physician: Oren Binet Time called: [id number] Time at bedside: Sean [name] is an 61 y.o. male.  HPI: Sean [name] came to the ED yesterday with progressive right hip pain over the past 4-6 weeks that had rendered him essentially unable to ambulate. 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 Orthopedic surgery recommended office f/u to discuss elective THA but pt was unable to discharge safely given his mobility and housing issues. Orthopedic surgery was reconsulted the following day. He denies any know injury to the hip. Says it's been sore enough to make him limp for quite some time but only has gotten severe in the last month.  Past Medical History:  Diagnosis Date   Arthritis    Depression    Diabetes mellitus without complication ([place])    Hypertension    Psychiatric problem     Past Surgical History:  Procedure Laterality Date   OTHER SURGICAL HISTORY     reports surgery on his head as a child- he is not sure why    Family History  Family history unknown: Yes    Social History:  reports that he has quit smoking. He does not have any smokeless tobacco history on file. He reports that he does not drink alcohol. No history on file for drug use.  Allergies: No Known Allergies  Medications: I have reviewed the patient's current medications.  Results for orders placed or performed during the hospital encounter of 11/25/21 (from the past 48 hour(s))  Basic metabolic panel     Status: Abnormal   Collection Time: 11/26/21  9:23 AM  Result Value Ref Range   Sodium 130 (L) 135 - 145 mmol/L   Potassium 4.4 3.5 - 5.1 mmol/L   Chloride 95 (L) 98 - 111 mmol/L   CO2 24 22 - 32 mmol/L   Glucose, Bld 185 (H) 70 - 99 mg/dL    Comment: Glucose reference range applies only to samples taken after fasting for at least 8 hours.   BUN 19 6 - 20 mg/dL   Creatinine, Ser 1.17 0.61 - 1.24 mg/dL   Calcium 10.4 (H) 8.9 - 10.3 mg/dL   GFR, Estimated >60 >60 mL/min     Comment: (NOTE) Calculated using the CKD-EPI Creatinine Equation (2021)    Anion gap 11 5 - 15    Comment: Performed at [facility] [address], [place], [place] [address]  CBC with Differential     Status: Abnormal   Collection Time: 11/26/21  9:23 AM  Result Value Ref Range   WBC 15.0 (H) 4.0 - 10.5 K/uL   RBC 3.98 (L) 4.22 - 5.81 MIL/uL   Hemoglobin 13.2 13.0 - 17.0 g/dL   HCT 38.1 (L) 39.0 - 52.0 %   MCV 95.7 80.0 - 100.0 fL   MCH 33.2 26.0 - 34.0 pg   MCHC 34.6 30.0 - 36.0 g/dL   RDW 13.2 11.5 - 15.5 %   Platelets 242 150 - 400 K/uL   nRBC 0.0 0.0 - 0.2 %   Neutrophils Relative % 92 %   Neutro Abs 13.8 (H) 1.7 - 7.7 K/uL   Lymphocytes Relative 5 %   Lymphs Abs 0.8 0.7 - 4.0 K/uL   Monocytes Relative 2 %   Monocytes Absolute 0.4 0.1 - 1.0 K/uL   Eosinophils Relative 0 %   Eosinophils Absolute 0.0 0.0 - 0.5 K/uL   Basophils Relative 0 %   Basophils Absolute 0.0 0.0 - 0.1 K/uL  Immature Granulocytes 1 %   Abs Immature Granulocytes 0.08 (H) 0.00 - 0.07 K/uL    Comment: Performed at [facility], [place] [address] [place], [place] [id number]  Basic metabolic panel     Status: Abnormal   Collection Time: 11/27/21  2:26 AM  Result Value Ref Range   Sodium 131 (L) 135 - 145 mmol/L   Potassium 4.6 3.5 - 5.1 mmol/L   Chloride 99 98 - 111 mmol/L   CO2 24 22 - 32 mmol/L   Glucose, Bld 134 (H) 70 - 99 mg/dL    Comment: Glucose reference range applies only to samples taken after fasting for at least 8 hours.   BUN 27 (H) 6 - 20 mg/dL   Creatinine, Ser 1.20 0.61 - 1.24 mg/dL   Calcium 9.9 8.9 - 10.3 mg/dL   GFR, Estimated >60 >60 mL/min    Comment: (NOTE) Calculated using the CKD-EPI Creatinine Equation (2021)    Anion gap 8 5 - 15    Comment: Performed at [facility] [address] [place], [place] [address]  CBC     Status: Abnormal   Collection Time: 11/27/21  2:26 AM  Result Value Ref Range   WBC 18.6 (H) 4.0 - 10.5 K/uL   RBC 3.47 (L) 4.22 -  5.81 MIL/uL   Hemoglobin 11.2 (L) 13.0 - 17.0 g/dL   HCT 32.6 (L) 39.0 - 52.0 %   MCV 93.9 80.0 - 100.0 fL   MCH 32.3 26.0 - 34.0 pg   MCHC 34.4 30.0 - 36.0 g/dL   RDW 13.2 11.5 - 15.5 %   Platelets 259 150 - 400 K/uL   nRBC 0.0 0.0 - 0.2 %    Comment: Performed at [facility], [place] [address] [place], [place] [address]  HIV Antibody (routine testing w rflx)     Status: None   Collection Time: 11/27/21  2:26 AM  Result Value Ref Range   HIV Screen 4th Generation wRfx Non Reactive Non Reactive    Comment: Performed at [facility], [place] [address], [place], [place] [address]  Glucose, capillary     Status: Abnormal   Collection Time: 11/27/21  9:57 AM  Result Value Ref Range   Glucose-Capillary 158 (H) 70 - 99 mg/dL    Comment: Glucose reference range applies only to samples taken after fasting for at least 8 hours.  Glucose, capillary     Status: Abnormal   Collection Time: 11/27/21 12:37 PM  Result Value Ref Range   Glucose-Capillary 149 (H) 70 - 99 mg/dL    Comment: Glucose reference range applies only to samples taken after fasting for at least 8 hours.    DG Chest 1 View  Result Date: 11/27/2021 CLINICAL DATA:  Right hip pain EXAM: CHEST  1 VIEW COMPARISON:  04/12/2008 FINDINGS: Cardiac size is within normal limits. Thoracic aorta is tortuous and ectatic. Lung fields are clear of any infiltrates or pulmonary edema. There is no significant pleural effusion or pneumothorax. There are small linear densities in the right lower lung fields. IMPRESSION: There are no signs of pulmonary edema or focal pulmonary consolidation. Linear densities in the right lower lung fields may suggest scarring. Electronically Signed   By: Elmer Picker M.D.   On: 11/27/2021 12:17   MR LUMBAR SPINE WO CONTRAST  Result Date: 11/26/2021 CLINICAL DATA:  61 year old male with low back pain radiating to the right leg. Weakness. Difficulty ambulating. EXAM: MRI LUMBAR SPINE  WITHOUT CONTRAST  TECHNIQUE: Multiplanar, multisequence MR imaging of the lumbar spine was performed. No intravenous contrast was administered. COMPARISON:  Thoracolumbar radiographs 02/03/2010. FINDINGS: Segmentation:  Normal on the comparison radiographs. Alignment: Relatively stable lumbar lordosis since 2011. No significant spondylolisthesis. Vertebrae: No marrow edema or evidence of acute osseous abnormality. Visualized bone marrow signal is within normal limits. Intact visible sacrum and SI joints. Conus medullaris and cauda equina: Conus extends to the L1 level. No lower spinal cord or conus signal abnormality. Paraspinal and other soft tissues: Conspicuous asymmetry of the psoas musculature (series 9, image 24) which appears hypertrophied on the left, smaller but not definitely atrophied on the right. The posterior paraspinal muscles appear more symmetric. Negative visible abdominal viscera. No paraspinal soft tissue inflammation identified. Disc levels: T11-T12: Negative. T12-L1:  Subtle disc bulging.  No stenosis. L1-L2: Mild disc space loss with circumferential disc bulging. Mild posterior element hypertrophy. No spinal or lateral recess stenosis. Mild L1 neural foraminal stenosis greater on the right. L2-L3: Negative disc. Mild to moderate facet hypertrophy. No significant stenosis. L3-L4: Mild disc space loss and circumferential disc bulge. Mild to moderate facet and ligament flavum hypertrophy. No spinal or lateral recess stenosis. Mild L3 foraminal stenosis mostly on the left. L4-L5: Mild circumferential disc bulge. Small posterior annular fissure in the midline. Moderate facet and ligament flavum hypertrophy. Degenerative facet joint fluid. Epidural lipomatosis. Borderline to mild spinal stenosis. No lateral recess stenosis. Mild to moderate bilateral L4 neural foraminal stenosis appears fairly symmetric. L5-S1: Disc desiccation. Circumferential disc bulge and endplate spurring. Broad-based posterior and biforaminal  involvement. Moderate facet hypertrophy. No spinal or lateral recess stenosis. But moderate to severe bilateral L5 foraminal stenosis, perhaps greater on the left. IMPRESSION: 1. Bulky L5-S1 circumferential disc osteophyte complex and moderate facet degeneration. No spinal or lateral recess stenosis but moderate to severe bilateral neural foraminal stenosis. Query L5 radiculitis. 2. L4-L5 mild disc degeneration but moderate bilateral facet arthropathy. Borderline to mild multifactorial spinal stenosis and mild to moderate bilateral L4 foraminal stenosis. 3. Elsewhere generally mild for age lumbar disc degeneration. No acute osseous abnormality in the lumbosacral spine. Electronically Signed   By: Genevie Ann M.D.   On: 11/26/2021 05:18   MR HIP RIGHT WO CONTRAST  Result Date: 11/26/2021 CLINICAL DATA:  Right leg pain for 6 months EXAM: MR OF THE RIGHT HIP WITHOUT CONTRAST TECHNIQUE: Multiplanar, multisequence MR imaging was performed. No intravenous contrast was administered. COMPARISON:  X-ray 02/03/2010 FINDINGS: Technical note: Incomplete examination. Patient was unable to tolerate further imaging. Best possible images were obtained and submitted for interpretation. 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 There is marked flattening of the femoral head and remodeling of the acetabulum. Numerous subchondral cysts within the acetabulum. Extensive bone marrow edema within the residual femoral head and extending into the femoral neck. Reactive bone marrow edema throughout the right acetabulum. Small complex right hip joint effusion, likely reactive. No dislocation. Small focus of avascular necrosis involving the left femoral head. No associated subchondral fracture or subchondral collapse. Left femoral head contour is maintained. Mild  osteoarthritis of the left hip. Tearing of the superior left labrum. Lobulated 4.3 x 2.1 x 2.9 cm cyst along the posterior margin of the left acetabulum, which could represent a paralabral cyst or ganglion. Bony pelvis intact. Mild arthropathy of the pubic symphysis. No suspicious marrow replacing  bone lesion. Ligaments Grossly intact. Muscles and Tendons No acute musculotendinous abnormality on limited exam. Small volume right peritrochanteric bursal fluid. Soft tissues Fat containing right inguinal hernia. No mass or fluid collection. Mild prostatomegaly. IMPRESSION: 1. Incomplete examination. See above. 2. 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 Large subchondral fracture of the right femoral head superiorly. Extensive bone marrow edema within the femoral head and neck as well as within the right acetabulum. Small complex right hip joint effusion, likely reactive. 3. Small focus of avascular necrosis involving the left femoral head without associated subchondral fracture or subchondral collapse. 4. Mild osteoarthritis of the left hip. Tearing of the superior left labrum. Lobulated 4.3 x 2.1 x 2.9 cm cyst along the posterior margin of the left acetabulum, which could represent a paralabral cyst or ganglion. 5. Mild prostatomegaly. Electronically Signed   By: Davina Poke D.O.   On: 11/26/2021 08:15   DG HIP UNILAT WITH PELVIS 2-3 VIEWS RIGHT  Result Date: 11/27/2021 CLINICAL DATA:  Right hip pain. EXAM: DG HIP (WITH OR WITHOUT PELVIS) 2-3V RIGHT COMPARISON:  MRI 11/26/2021 FINDINGS: Chronic AVN with marked flattening of the right femoral head. Associated severe secondary a hip joint degenerative changes with severe joint space narrowing, bony eburnation, subchondral cystic change and probable fragmentation. Suspect remote posttraumatic changes involving the right ischium. The left hip is intact. The SI joints are  intact. IMPRESSION: 1. 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 2. Severe right hip joint degenerative changes. Electronically Signed   By: Marijo Sanes M.D.   On: 11/27/2021 12:21    Review of Systems  HENT:  Negative for ear discharge, ear pain, hearing loss and tinnitus.   Eyes:  Negative for photophobia and pain.  Respiratory:  Negative for cough and shortness of breath.   Cardiovascular:  Negative for chest pain.  Gastrointestinal:  Negative for abdominal pain, nausea and vomiting.  Genitourinary:  Negative for dysuria, flank pain, frequency and urgency.  Musculoskeletal:  Positive for arthralgias (Right hip>>right knee and left hip). Negative for back pain, myalgias and neck pain.  Neurological:  Negative for dizziness and headaches.  Hematological:  Does not bruise/bleed easily.  Psychiatric/Behavioral:  The patient is not nervous/anxious.   Blood pressure 110/78, pulse 76, temperature 98 F (36.7 C), temperature source Oral, resp. rate 16, height 5\' 6"  (1.676 m), weight 127 kg, SpO2 95 %. Physical Exam Constitutional:      General: He is not in acute distress.    Appearance: He is well-developed. He is not diaphoretic.  HENT:     Head: Normocephalic and atraumatic.  Eyes:     General: No scleral icterus.       Right eye: No discharge.        Left eye: No discharge.     Conjunctiva/sclera: Conjunctivae normal.  Cardiovascular:     Rate and Rhythm: Normal rate and regular rhythm.  Pulmonary:     Effort: Pulmonary effort is normal. No respiratory distress.  Musculoskeletal:     Cervical back: Normal range of motion.     Comments: RLE No traumatic wounds, ecchymosis, or rash  Nontender, pain with IR/ER/Flex  No knee or ankle effusion  Knee stable to varus/ valgus and anterior/posterior stress  Sens DPN, SPN, TN intact  Motor EHL, ext, flex, evers 5/5  DP 1+, PT 0, No significant edema  Skin:    General: Skin is warm and  dry.  Neurological:     Mental  Status: He is alert.  Psychiatric:        Mood and Affect: Mood normal.        Behavior: Behavior normal.    Assessment/Plan: Right hip AVN -- Plan THA tomorrow by Dr. Lyla Glassing. Please keep NPO after MN.    Lisette Abu, PA-C Orthopedic Surgery [phone] 11/27/2021, 1:29 PM

## 2021-11-27 NOTE — H&P (View-Only) (Signed)
Reason for Consult:Right hip AVN Referring Physician: Shanker Ghimire Time called: 1048 Time at bedside: 1317   Sean [name] is an 61 y.o. male.  HPI: Sean [name] came to the ED yesterday with progressive right hip pain over the past 4-6 weeks that had rendered him essentially unable to ambulate. 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 Orthopedic surgery recommended office f/u to discuss elective THA but pt was unable to discharge safely given his mobility and housing issues. Orthopedic surgery was reconsulted the following day. He denies any know injury to the hip. Says it's been sore enough to make him limp for quite some time but only has gotten severe in the last month.  Past Medical History:  Diagnosis Date   Arthritis    Depression    Diabetes mellitus without complication (HCC)    Hypertension    Psychiatric problem     Past Surgical History:  Procedure Laterality Date   OTHER SURGICAL HISTORY     reports surgery on his head as a child- he is not sure why    Family History  Family history unknown: Yes    Social History:  reports that he has quit smoking. He does not have any smokeless tobacco history on file. He reports that he does not drink alcohol. No history on file for drug use.  Allergies: No Known Allergies  Medications: I have reviewed the patient's current medications.  Results for orders placed or performed during the hospital encounter of 11/25/21 (from the past 48 hour(s))  Basic metabolic panel     Status: Abnormal   Collection Time: 11/26/21  9:23 AM  Result Value Ref Range   Sodium 130 (L) 135 - 145 mmol/L   Potassium 4.4 3.5 - 5.1 mmol/L   Chloride 95 (L) 98 - 111 mmol/L   CO2 24 22 - 32 mmol/L   Glucose, Bld 185 (H) 70 - 99 mg/dL    Comment: Glucose reference range applies only to samples taken after fasting for at least 8 hours.   BUN 19 6 - 20 mg/dL   Creatinine, Ser 1.17 0.61 - 1.24 mg/dL   Calcium 10.4 (H) 8.9 - 10.3 mg/dL   GFR, Estimated >60 >60 mL/min     Comment: (NOTE) Calculated using the CKD-EPI Creatinine Equation (2021)    Anion gap 11 5 - 15    Comment: Performed at [facility], [address] [place], [place] [address]  CBC with Differential     Status: Abnormal   Collection Time: 11/26/21  9:23 AM  Result Value Ref Range   WBC 15.0 (H) 4.0 - 10.5 K/uL   RBC 3.98 (L) 4.22 - 5.81 MIL/uL   Hemoglobin 13.2 13.0 - 17.0 g/dL   HCT 38.1 (L) 39.0 - 52.0 %   MCV 95.7 80.0 - 100.0 fL   MCH 33.2 26.0 - 34.0 pg   MCHC 34.6 30.0 - 36.0 g/dL   RDW 13.2 11.5 - 15.5 %   Platelets 242 150 - 400 K/uL   nRBC 0.0 0.0 - 0.2 %   Neutrophils Relative % 92 %   Neutro Abs 13.8 (H) 1.7 - 7.7 K/uL   Lymphocytes Relative 5 %   Lymphs Abs 0.8 0.7 - 4.0 K/uL   Monocytes Relative 2 %   Monocytes Absolute 0.4 0.1 - 1.0 K/uL   Eosinophils Relative 0 %   Eosinophils Absolute 0.0 0.0 - 0.5 K/uL   Basophils Relative 0 %   Basophils Absolute 0.0 0.0 - 0.1 K/uL     Immature Granulocytes 1 %   Abs Immature Granulocytes 0.08 (H) 0.00 - 0.07 K/uL    Comment: Performed at [facility], [address] [place], [place] [address]  Basic metabolic panel     Status: Abnormal   Collection Time: 11/27/21  2:26 AM  Result Value Ref Range   Sodium 131 (L) 135 - 145 mmol/L   Potassium 4.6 3.5 - 5.1 mmol/L   Chloride 99 98 - 111 mmol/L   CO2 24 22 - 32 mmol/L   Glucose, Bld 134 (H) 70 - 99 mg/dL    Comment: Glucose reference range applies only to samples taken after fasting for at least 8 hours.   BUN 27 (H) 6 - 20 mg/dL   Creatinine, Ser 1.20 0.61 - 1.24 mg/dL   Calcium 9.9 8.9 - 10.3 mg/dL   GFR, Estimated >60 >60 mL/min    Comment: (NOTE) Calculated using the CKD-EPI Creatinine Equation (2021)    Anion gap 8 5 - 15    Comment: Performed at [facility], [address] [place], [place] [address]  CBC     Status: Abnormal   Collection Time: 11/27/21  2:26 AM  Result Value Ref Range   WBC 18.6 (H) 4.0 - 10.5 K/uL   RBC 3.47 (L) 4.22 -  5.81 MIL/uL   Hemoglobin 11.2 (L) 13.0 - 17.0 g/dL   HCT 32.6 (L) 39.0 - 52.0 %   MCV 93.9 80.0 - 100.0 fL   MCH 32.3 26.0 - 34.0 pg   MCHC 34.4 30.0 - 36.0 g/dL   RDW 13.2 11.5 - 15.5 %   Platelets 259 150 - 400 K/uL   nRBC 0.0 0.0 - 0.2 %    Comment: Performed at [facility], [address] [place], [place] [address]  HIV Antibody (routine testing w rflx)     Status: None   Collection Time: 11/27/21  2:26 AM  Result Value Ref Range   HIV Screen 4th Generation wRfx Non Reactive Non Reactive    Comment: Performed at  Hospital [facility], [address] [place], [place] [address]  Glucose, capillary     Status: Abnormal   Collection Time: 11/27/21  9:57 AM  Result Value Ref Range   Glucose-Capillary 158 (H) 70 - 99 mg/dL    Comment: Glucose reference range applies only to samples taken after fasting for at least 8 hours.  Glucose, capillary     Status: Abnormal   Collection Time: 11/27/21 12:37 PM  Result Value Ref Range   Glucose-Capillary 149 (H) 70 - 99 mg/dL    Comment: Glucose reference range applies only to samples taken after fasting for at least 8 hours.    DG Chest 1 View  Result Date: 11/27/2021 CLINICAL DATA:  Right hip pain EXAM: CHEST  1 VIEW COMPARISON:  04/12/2008 FINDINGS: Cardiac size is within normal limits. Thoracic aorta is tortuous and ectatic. Lung fields are clear of any infiltrates or pulmonary edema. There is no significant pleural effusion or pneumothorax. There are small linear densities in the right lower lung fields. IMPRESSION: There are no signs of pulmonary edema or focal pulmonary consolidation. Linear densities in the right lower lung fields may suggest scarring. Electronically Signed   By: Palani  Rathinasamy M.D.   On: 11/27/2021 12:17   MR LUMBAR SPINE WO CONTRAST  Result Date: 11/26/2021 CLINICAL DATA:  57-year-old male with low back pain radiating to the right leg. Weakness. Difficulty ambulating. EXAM: MRI LUMBAR SPINE   WITHOUT CONTRAST  TECHNIQUE: Multiplanar, multisequence MR imaging of the lumbar spine was performed. No intravenous contrast was administered. COMPARISON:  Thoracolumbar radiographs 02/03/2010. FINDINGS: Segmentation:  Normal on the comparison radiographs. Alignment: Relatively stable lumbar lordosis since 2011. No significant spondylolisthesis. Vertebrae: No marrow edema or evidence of acute osseous abnormality. Visualized bone marrow signal is within normal limits. Intact visible sacrum and SI joints. Conus medullaris and cauda equina: Conus extends to the L1 level. No lower spinal cord or conus signal abnormality. Paraspinal and other soft tissues: Conspicuous asymmetry of the psoas musculature (series 9, image 24) which appears hypertrophied on the left, smaller but not definitely atrophied on the right. The posterior paraspinal muscles appear more symmetric. Negative visible abdominal viscera. No paraspinal soft tissue inflammation identified. Disc levels: T11-T12: Negative. T12-L1:  Subtle disc bulging.  No stenosis. L1-L2: Mild disc space loss with circumferential disc bulging. Mild posterior element hypertrophy. No spinal or lateral recess stenosis. Mild L1 neural foraminal stenosis greater on the right. L2-L3: Negative disc. Mild to moderate facet hypertrophy. No significant stenosis. L3-L4: Mild disc space loss and circumferential disc bulge. Mild to moderate facet and ligament flavum hypertrophy. No spinal or lateral recess stenosis. Mild L3 foraminal stenosis mostly on the left. L4-L5: Mild circumferential disc bulge. Small posterior annular fissure in the midline. Moderate facet and ligament flavum hypertrophy. Degenerative facet joint fluid. Epidural lipomatosis. Borderline to mild spinal stenosis. No lateral recess stenosis. Mild to moderate bilateral L4 neural foraminal stenosis appears fairly symmetric. L5-S1: Disc desiccation. Circumferential disc bulge and endplate spurring. Broad-based posterior and biforaminal  involvement. Moderate facet hypertrophy. No spinal or lateral recess stenosis. But moderate to severe bilateral L5 foraminal stenosis, perhaps greater on the left. IMPRESSION: 1. Bulky L5-S1 circumferential disc osteophyte complex and moderate facet degeneration. No spinal or lateral recess stenosis but moderate to severe bilateral neural foraminal stenosis. Query L5 radiculitis. 2. L4-L5 mild disc degeneration but moderate bilateral facet arthropathy. Borderline to mild multifactorial spinal stenosis and mild to moderate bilateral L4 foraminal stenosis. 3. Elsewhere generally mild for age lumbar disc degeneration. No acute osseous abnormality in the lumbosacral spine. Electronically Signed   By: H  Hall M.D.   On: 11/26/2021 05:18   MR HIP RIGHT WO CONTRAST  Result Date: 11/26/2021 CLINICAL DATA:  Right leg pain for 6 months EXAM: MR OF THE RIGHT HIP WITHOUT CONTRAST TECHNIQUE: Multiplanar, multisequence MR imaging was performed. No intravenous contrast was administered. COMPARISON:  X-ray 02/03/2010 FINDINGS: Technical note: Incomplete examination. Patient was unable to tolerate further imaging. Best possible images were obtained and submitted for interpretation. 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 There is marked flattening of the femoral head and remodeling of the acetabulum. Numerous subchondral cysts within the acetabulum. Extensive bone marrow edema within the residual femoral head and extending into the femoral neck. Reactive bone marrow edema throughout the right acetabulum. Small complex right hip joint effusion, likely reactive. No dislocation. Small focus of avascular necrosis involving the left femoral head. No associated subchondral fracture or subchondral collapse. Left femoral head contour is maintained. Mild  osteoarthritis of the left hip. Tearing of the superior left labrum. Lobulated 4.3 x 2.1 x 2.9 cm cyst along the posterior margin of the left acetabulum, which could represent a paralabral cyst or ganglion. Bony pelvis intact. Mild arthropathy of the pubic symphysis. No suspicious marrow replacing   bone lesion. Ligaments Grossly intact. Muscles and Tendons No acute musculotendinous abnormality on limited exam. Small volume right peritrochanteric bursal fluid. Soft tissues Fat containing right inguinal hernia. No mass or fluid collection. Mild prostatomegaly. IMPRESSION: 1. Incomplete examination. See above. 2. 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 Large subchondral fracture of the right femoral head superiorly. Extensive bone marrow edema within the femoral head and neck as well as within the right acetabulum. Small complex right hip joint effusion, likely reactive. 3. Small focus of avascular necrosis involving the left femoral head without associated subchondral fracture or subchondral collapse. 4. Mild osteoarthritis of the left hip. Tearing of the superior left labrum. Lobulated 4.3 x 2.1 x 2.9 cm cyst along the posterior margin of the left acetabulum, which could represent a paralabral cyst or ganglion. 5. Mild prostatomegaly. Electronically Signed   By: Nicholas  Plundo D.O.   On: 11/26/2021 08:15   DG HIP UNILAT WITH PELVIS 2-3 VIEWS RIGHT  Result Date: 11/27/2021 CLINICAL DATA:  Right hip pain. EXAM: DG HIP (WITH OR WITHOUT PELVIS) 2-3V RIGHT COMPARISON:  MRI 11/26/2021 FINDINGS: Chronic AVN with marked flattening of the right femoral head. Associated severe secondary a hip joint degenerative changes with severe joint space narrowing, bony eburnation, subchondral cystic change and probable fragmentation. Suspect remote posttraumatic changes involving the right ischium. The left hip is intact. The SI joints are  intact. IMPRESSION: 1. 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 2. Severe right hip joint degenerative changes. Electronically Signed   By: P.  Gallerani M.D.   On: 11/27/2021 12:21    Review of Systems  HENT:  Negative for ear discharge, ear pain, hearing loss and tinnitus.   Eyes:  Negative for photophobia and pain.  Respiratory:  Negative for cough and shortness of breath.   Cardiovascular:  Negative for chest pain.  Gastrointestinal:  Negative for abdominal pain, nausea and vomiting.  Genitourinary:  Negative for dysuria, flank pain, frequency and urgency.  Musculoskeletal:  Positive for arthralgias (Right hip>>right knee and left hip). Negative for back pain, myalgias and neck pain.  Neurological:  Negative for dizziness and headaches.  Hematological:  Does not bruise/bleed easily.  Psychiatric/Behavioral:  The patient is not nervous/anxious.   Blood pressure 110/78, pulse 76, temperature 98 F (36.7 C), temperature source Oral, resp. rate 16, height 5' 6" (1.676 m), weight 127 kg, SpO2 95 %. Physical Exam Constitutional:      General: He is not in acute distress.    Appearance: He is well-developed. He is not diaphoretic.  HENT:     Head: Normocephalic and atraumatic.  Eyes:     General: No scleral icterus.       Right eye: No discharge.        Left eye: No discharge.     Conjunctiva/sclera: Conjunctivae normal.  Cardiovascular:     Rate and Rhythm: Normal rate and regular rhythm.  Pulmonary:     Effort: Pulmonary effort is normal. No respiratory distress.  Musculoskeletal:     Cervical back: Normal range of motion.     Comments: RLE No traumatic wounds, ecchymosis, or rash  Nontender, pain with IR/ER/Flex  No knee or ankle effusion  Knee stable to varus/ valgus and anterior/posterior stress  Sens DPN, SPN, TN intact  Motor EHL, ext, flex, evers 5/5  DP 1+, PT 0, No significant edema  Skin:    General: Skin is warm and   dry.  Neurological:     Mental  Status: He is alert.  Psychiatric:        Mood and Affect: Mood normal.        Behavior: Behavior normal.    Assessment/Plan: Right hip AVN -- Plan THA tomorrow by Dr. Swinteck. Please keep NPO after MN.    Koury Roddy J. Blessing Ozga, PA-C Orthopedic Surgery [phone] 11/27/2021, 1:29 PM  

## 2021-11-27 NOTE — Evaluation (Signed)
Physical Therapy Evaluation Patient Details Name: Sean [name] MRN: [id number] DOB: 12/24/1960 Today's Date: 11/27/2021  History of Present Illness  61 yo admited 5/30 with hip pain and impaired gait. 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 PMhx: OA, HTN, DM, obesity, anxiety/depression  Clinical Impression  Pt pleasant and reports having no residence to D/C too and that he was living in his car and dropping in at friend's house to bathe. Pt with reliance on assist for bed and standing transfers at this time and limited tolerance for gait with RW. Pt with decreased strength, rOM, power, gait and activity tolerance who will benefit from acute therapy to maximize mobility, safety and independence to decrease burden of care.         Recommendations for follow up therapy are one component of a multi-disciplinary discharge planning process, led by the attending physician.  Recommendations may be updated based on patient status, additional functional criteria and insurance authorization.  Follow Up Recommendations Skilled nursing-short term rehab (<3 hours/day)    Assistance Recommended at Discharge Intermittent Supervision/Assistance  Patient can return home with the following  A lot of help with walking and/or transfers;A lot of help with bathing/dressing/bathroom;Assist for transportation;Assistance with cooking/housework    Equipment Recommendations Rolling walker (2 wheels);BSC/3in1  Recommendations for Other Services       Functional Status Assessment Patient has had a recent decline in their functional status and demonstrates the ability to make significant improvements in function in a reasonable and predictable amount of time.     Precautions / Restrictions Precautions Precautions: Fall      Mobility  Bed Mobility Overal bed mobility: Needs Assistance Bed Mobility: Supine to Sit     Supine to sit: Mod assist, HOB  elevated     General bed mobility comments: HOB 15 degrees, reliance on rail, increased time, mod cues for sequence and safety with assist to clear legs off bed    Transfers Overall transfer level: Needs assistance   Transfers: Sit to/from Stand Sit to Stand: Mod assist, From elevated surface           General transfer comment: bed elevated grossly 30" with mod assist to rise and cues for hand and RLE placement    Ambulation/Gait Ambulation/Gait assistance: Min guard Gait Distance (Feet): 20 Feet Assistive device: Rolling walker (2 wheels) Gait Pattern/deviations: Step-to pattern, Decreased stride length, Decreased stance time - right   Gait velocity interpretation: <1.31 ft/sec, indicative of household ambulator   General Gait Details: cues for sequence with limited stance time on RLE due to pain  Stairs            Wheelchair Mobility    Modified Rankin (Stroke Patients Only)       Balance Overall balance assessment: Needs assistance   Sitting balance-Leahy Scale: Fair Sitting balance - Comments: pt able to sit statically without support   Standing balance support: Bilateral upper extremity supported Standing balance-Leahy Scale: Poor Standing balance comment: reliance on RW for standing due to pain                             Pertinent Vitals/Pain Pain Assessment Pain Assessment: 0-10 Pain Score: 5  Pain Location: Rt hip and knee Pain Descriptors / Indicators: Aching, Guarding Pain Intervention(s): Limited activity within patient's tolerance, Monitored during session, Repositioned    Home Living Family/patient expects to be discharged to::  Shelter/Homeless                   Additional Comments: has been living in his car, stayed with a friend for awhile, plans to hopefully find a boarding house    Prior Function Prior Level of Function : Independent/Modified Independent             Mobility Comments: walks with cane        Hand Dominance        Extremity/Trunk Assessment   Upper Extremity Assessment Upper Extremity Assessment: Overall WFL for tasks assessed    Lower Extremity Assessment Lower Extremity Assessment: RLE deficits/detail RLE Deficits / Details: 2+/5 hip flexion, 4/5 knee extension and flexion    Cervical / Trunk Assessment Cervical / Trunk Assessment: Normal  Communication   Communication: No difficulties  Cognition Arousal/Alertness: Awake/alert Behavior During Therapy: WFL for tasks assessed/performed Overall Cognitive Status: Within Functional Limits for tasks assessed                                          General Comments      Exercises General Exercises - Lower Extremity Long Arc Quad: AROM, Both, Seated, 10 reps Hip Flexion/Marching: AROM, AAROM, Right, Left, Seated, 5 reps, 10 reps (AAROm on RLE x 5, AROm x 10 on LLE)   Assessment/Plan    PT Assessment Patient needs continued PT services  PT Problem List Decreased strength;Decreased mobility;Decreased range of motion;Decreased coordination;Pain;Decreased balance;Decreased knowledge of use of DME;Decreased activity tolerance       PT Treatment Interventions Gait training;Balance training;Functional mobility training;Therapeutic activities;Patient/family education;Therapeutic exercise;DME instruction    PT Goals (Current goals can be found in the Care Plan section)  Acute Rehab PT Goals Patient Stated Goal: be able to walk PT Goal Formulation: With patient Time For Goal Achievement: 12/11/21 Potential to Achieve Goals: Fair    Frequency Min 3X/week     Co-evaluation               AM-PAC PT "6 Clicks" Mobility  Outcome Measure Help needed turning from your back to your side while in a flat bed without using bedrails?: A Lot Help needed moving from lying on your back to sitting on the side of a flat bed without using bedrails?: A Lot Help needed moving to and from a bed to a chair  (including a wheelchair)?: A Lot Help needed standing up from a chair using your arms (e.g., wheelchair or bedside chair)?: A Lot Help needed to walk in hospital room?: A Little Help needed climbing 3-5 steps with a railing? : Total 6 Click Score: 12    End of Session Equipment Utilized During Treatment: Gait belt Activity Tolerance: Patient tolerated treatment well Patient left: in chair;with call bell/phone within reach;with chair alarm set Nurse Communication: Mobility status PT Visit Diagnosis: Other abnormalities of gait and mobility (R26.89);Difficulty in walking, not elsewhere classified (R26.2);Muscle weakness (generalized) (M62.81)    Time: 6962-9528 PT Time Calculation (min) (ACUTE ONLY): 25 min   Charges:   PT Evaluation $PT Eval Moderate Complexity: 1 Mod PT Treatments $Therapeutic Activity: 8-22 mins        Cheri Ayotte P, PT Acute Rehabilitation Services Pager: [phone] Office: [phone]   Kuulei Kleier B Vung Kush 11/27/2021, 8:57 AM

## 2021-11-27 NOTE — Progress Notes (Signed)
  Transition of Care [facility]) Screening Note   Patient Details  Name: TESEAN SUGERMAN Date of Birth: 1961-03-31   Transition of Care [facility]) CM/SW Contact:    Cyndi Bender, RN Phone Number: 11/27/2021, 2:33 PM    Transition of Care Department [facility]) has reviewed patient and no TOC needs have been identified at this time. We will continue to monitor patient advancement through interdisciplinary progression rounds. If new patient transition needs arise, please place a TOC consult.

## 2021-11-27 NOTE — Progress Notes (Signed)
See above                        PROGRESS NOTE        PATIENT DETAILS Name: Sean [name] Age: 61 y.o. Sex: male Date of Birth: 1960/07/23 Admit Date: 11/25/2021 Admitting Physician Emeline General, MD AUQ:[id number], Lovie Macadamia, MD  Brief Summary: Patient is a 61 y.o.  male with history of chronic right>left hip pain-presented with intractable right hip pain (not relieved by oral narcotics)-with significant leukocytosis unable to ambulate-and subsequently admitted to the hospitalist service.   Significant events: 5/30>> presented to ED with intractable right hip pain-unable to ambulate.    Significant studies: 5/30>> x-ray right knee: No acute radiographic abnormality of the right knee. 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  Small complex right hip joint effusion-likely reactive. 5/31>> MRI lumbosacral spine: Bulky L5-S1 disc osteophyte  Significant microbiology data: 6/1>> blood cultures: Pending  Procedures: None  Consults: Orthopedics  Subjective: Severe right hip pain when he attempts to ambulate.  Claims he was barely able to walk 10-20 feet with the help of a walker with physical therapy earlier this morning.   Objective: Vitals: Blood pressure 101/73, pulse 100, temperature 98 F (36.7 C), temperature source Oral, resp. rate 15, height 5\' 6"  (1.676 m), weight 127 kg, SpO2 94 %.   Exam: Gen Exam:Alert awake-not in any distress HEENT:atraumatic, normocephalic Chest: B/L clear to auscultation anteriorly CVS:S1S2 regular Abdomen:soft non tender, non distended Extremities:no edema Neurology: Non focal Skin: no rash  Pertinent Labs/Radiology:    Latest Ref Rng & Units 11/27/2021    2:26 AM 11/26/2021    9:23 AM 11/22/2021    1:47 PM  CBC  WBC 4.0 - 10.5 K/uL 18.6   15.0   9.7    Hemoglobin 13.0 - 17.0 g/dL 11/24/2021   62.5   63.8    Hematocrit 39.0 -  52.0 % 32.6   38.1   34.7    Platelets 150 - 400 K/uL 259   242   268      Lab Results  Component Value Date   NA 131 (L) 11/27/2021   K 4.6 11/27/2021   CL 99 11/27/2021   CO2 24 11/27/2021      Assessment/Plan: Intractable right hip pain: Due to avascular necrosis and resultant subchondral fracture-barely able to ambulate due to severe pain.  Changed to scheduled Tylenol-continue Mobic-use Percocet for moderate pain-use IV narcotics for intractable pain.  Have asked orthopedics to see if he can possibly get a right hip replacement during this hospitalization given severity of his pain and inability to ambulate.  Leukocytosis: No clear foci of infection apparent-afebrile but tachycardic this morning-may be developing SIRS physiology.  Will check UA/CXR/blood cultures-if no foci of infection apparent-he does have a small effusion on MRI in the right hip-May need to be tapped at some point.  Monitor off IV antibiotics for now.  Apparently may have been on Augmentin prior to this hospitalization.  Hyponatremia: Mild-stable for close monitoring.  HTN: Stable-continue lisinopril/amlodipine.  DM-2: Stop metformin-monitor CBGs with SSI-check A1c with a.m. labs  Anxiety/depression: Stable-continue trazodone/risperidone/Zoloft.  Homeless: Living in car-PT/OT eval.  Morbid Obesity: Estimated body mass index is 45.19 kg/m as calculated from the following:   Height as of this encounter: 5\' 6"  (1.676 m).   Weight as of this encounter: 127 kg.  Code status:   Code Status: Full Code   DVT Prophylaxis: enoxaparin (LOVENOX) injection 40 mg Start: 11/26/21 1315   Family Communication: None at bedside   Disposition Plan: Status is: Observation The patient will require care spanning > 2 midnights and should be moved to inpatient because: Intractable right hip pain-worsening leukocytosis-concern for infection with SIRS physiology-work-up in progress-not yet stable for discharge.   Planned  Discharge Destination:Skilled nursing facility   Diet: Diet Order             Diet heart healthy/carb modified Room service appropriate? Yes; Fluid consistency: Thin  Diet effective now                     Antimicrobial agents: Anti-infectives (From admission, onward)    None        MEDICATIONS: Scheduled Meds:  acetaminophen  1,000 mg Oral Q8H   amLODipine  5 mg Oral Daily   benztropine  0.5 mg Oral QHS   enoxaparin (LOVENOX) injection  40 mg Subcutaneous Q24H   FLUoxetine  40 mg Oral q morning   insulin aspart  0-9 Units Subcutaneous TID WC   lisinopril  20 mg Oral Daily   meloxicam  15 mg Oral Daily   risperiDONE  1 mg Oral BID   traZODone  100 mg Oral QHS   Continuous Infusions: PRN Meds:.bisacodyl, ketorolac, oxyCODONE, senna-docusate   I have personally reviewed following labs and imaging studies  LABORATORY DATA: CBC: Recent Labs  Lab 11/22/21 1347 11/26/21 0923 11/27/21 0226  WBC 9.7 15.0* 18.6*  NEUTROABS  --  13.8*  --   HGB 11.8* 13.2 11.2*  HCT 34.7* 38.1* 32.6*  MCV 96.7 95.7 93.9  PLT 268 242 259    Basic Metabolic Panel: Recent Labs  Lab 11/22/21 1347 11/26/21 0923 11/27/21 0226  NA 133* 130* 131*  K 4.3 4.4 4.6  CL 102 95* 99  CO2 26 24 24   GLUCOSE 119* 185* 134*  BUN 24* 19 27*  CREATININE 1.25* 1.17 1.20  CALCIUM 9.3 10.4* 9.9    GFR: Estimated Creatinine Clearance: 83.5 mL/min (by C-G formula based on SCr of 1.2 mg/dL).  Liver Function Tests: No results for input(s): AST, ALT, ALKPHOS, BILITOT, PROT, ALBUMIN in the last 168 hours. No results for input(s): LIPASE, AMYLASE in the last 168 hours. No results for input(s): AMMONIA in the last 168 hours.  Coagulation Profile: No results for input(s): INR, PROTIME in the last 168 hours.  Cardiac Enzymes: No results for input(s): CKTOTAL, CKMB, CKMBINDEX, TROPONINI in the last 168 hours.  BNP (last 3 results) No results for input(s): PROBNP in the last 8760  hours.  Lipid Profile: No results for input(s): CHOL, HDL, LDLCALC, TRIG, CHOLHDL, LDLDIRECT in the last 72 hours.  Thyroid Function Tests: No results for input(s): TSH, T4TOTAL, FREET4, T3FREE, THYROIDAB in the last 72 hours.  Anemia Panel: No results for input(s): VITAMINB12, FOLATE, FERRITIN, TIBC, IRON, RETICCTPCT in the last 72 hours.  Urine analysis: No results found for: COLORURINE, APPEARANCEUR, LABSPEC, PHURINE, GLUCOSEU, HGBUR, BILIRUBINUR, KETONESUR, PROTEINUR, UROBILINOGEN, NITRITE, LEUKOCYTESUR  Sepsis Labs: Lactic Acid, Venous No results found for: LATICACIDVEN  MICROBIOLOGY: No results found for this or any previous visit (from the past 240 hour(s)).  RADIOLOGY STUDIES/RESULTS: MR LUMBAR SPINE WO CONTRAST  Result Date: 11/26/2021 CLINICAL DATA:  61 year old male with low back pain radiating to the right leg. Weakness. Difficulty ambulating. EXAM: MRI LUMBAR SPINE WITHOUT CONTRAST TECHNIQUE: Multiplanar, multisequence MR imaging of the  lumbar spine was performed. No intravenous contrast was administered. COMPARISON:  Thoracolumbar radiographs 02/03/2010. FINDINGS: Segmentation:  Normal on the comparison radiographs. Alignment: Relatively stable lumbar lordosis since 2011. No significant spondylolisthesis. Vertebrae: No marrow edema or evidence of acute osseous abnormality. Visualized bone marrow signal is within normal limits. Intact visible sacrum and SI joints. Conus medullaris and cauda equina: Conus extends to the L1 level. No lower spinal cord or conus signal abnormality. Paraspinal and other soft tissues: Conspicuous asymmetry of the psoas musculature (series 9, image 24) which appears hypertrophied on the left, smaller but not definitely atrophied on the right. The posterior paraspinal muscles appear more symmetric. Negative visible abdominal viscera. No paraspinal soft tissue inflammation identified. Disc levels: T11-T12: Negative. T12-L1:  Subtle disc bulging.  No  stenosis. L1-L2: Mild disc space loss with circumferential disc bulging. Mild posterior element hypertrophy. No spinal or lateral recess stenosis. Mild L1 neural foraminal stenosis greater on the right. L2-L3: Negative disc. Mild to moderate facet hypertrophy. No significant stenosis. L3-L4: Mild disc space loss and circumferential disc bulge. Mild to moderate facet and ligament flavum hypertrophy. No spinal or lateral recess stenosis. Mild L3 foraminal stenosis mostly on the left. L4-L5: Mild circumferential disc bulge. Small posterior annular fissure in the midline. Moderate facet and ligament flavum hypertrophy. Degenerative facet joint fluid. Epidural lipomatosis. Borderline to mild spinal stenosis. No lateral recess stenosis. Mild to moderate bilateral L4 neural foraminal stenosis appears fairly symmetric. L5-S1: Disc desiccation. Circumferential disc bulge and endplate spurring. Broad-based posterior and biforaminal involvement. Moderate facet hypertrophy. No spinal or lateral recess stenosis. But moderate to severe bilateral L5 foraminal stenosis, perhaps greater on the left. IMPRESSION: 1. Bulky L5-S1 circumferential disc osteophyte complex and moderate facet degeneration. No spinal or lateral recess stenosis but moderate to severe bilateral neural foraminal stenosis. Query L5 radiculitis. 2. L4-L5 mild disc degeneration but moderate bilateral facet arthropathy. Borderline to mild multifactorial spinal stenosis and mild to moderate bilateral L4 foraminal stenosis. 3. Elsewhere generally mild for age lumbar disc degeneration. No acute osseous abnormality in the lumbosacral spine. Electronically Signed   By: Odessa FlemingH  Hall M.D.   On: 11/26/2021 05:18   MR HIP RIGHT WO CONTRAST  Result Date: 11/26/2021 CLINICAL DATA:  Right leg pain for 6 months EXAM: MR OF THE RIGHT HIP WITHOUT CONTRAST TECHNIQUE: Multiplanar, multisequence MR imaging was performed. No intravenous contrast was administered. COMPARISON:  X-ray  02/03/2010 FINDINGS: Technical note: Incomplete examination. Patient was unable to tolerate further imaging. Best possible images were obtained and submitted for interpretation. 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 There is marked flattening of the femoral head and remodeling of the acetabulum. Numerous subchondral cysts within the acetabulum. Extensive bone marrow edema within the residual femoral head and extending into the femoral neck. Reactive bone marrow edema throughout the right acetabulum. Small complex right hip joint effusion, likely reactive. No dislocation. Small focus of avascular necrosis involving the left femoral head. No associated subchondral fracture or subchondral collapse. Left femoral head contour is maintained. Mild osteoarthritis of the left hip. Tearing of the superior left labrum. Lobulated 4.3 x 2.1 x 2.9 cm cyst along the posterior margin of the left acetabulum, which could represent a paralabral cyst or ganglion. Bony pelvis intact. Mild arthropathy of the pubic symphysis. No suspicious marrow replacing bone lesion. Ligaments Grossly intact. Muscles and Tendons No acute  musculotendinous abnormality on limited exam. Small volume right peritrochanteric bursal fluid. Soft tissues Fat containing right inguinal hernia. No mass or fluid collection. Mild prostatomegaly. IMPRESSION: 1. Incomplete examination. See above. 2. 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 Large subchondral fracture of the right femoral head superiorly. Extensive bone marrow edema within the femoral head and neck as well as within the right acetabulum. Small complex right hip joint effusion, likely reactive. 3. Small focus of avascular necrosis  involving the left femoral head without associated subchondral fracture or subchondral collapse. 4. Mild osteoarthritis of the left hip. Tearing of the superior left labrum. Lobulated 4.3 x 2.1 x 2.9 cm cyst along the posterior margin of the left acetabulum, which could represent a paralabral cyst or ganglion. 5. Mild prostatomegaly. Electronically Signed   By: Duanne Guess D.O.   On: 11/26/2021 08:15     LOS: 0 days   Jeoffrey Massed, MD  [facility]    To contact the attending provider between 7A-7P or the covering provider during after hours 7P-7A, please log into the web site [email or web address] and access using universal [place] password for that web site. If you do not have the password, please call the hospital operator.  11/27/2021, 10:54 AM

## 2021-11-27 NOTE — Evaluation (Signed)
Occupational Therapy Evaluation Patient Details Name: Sean [name] MRN: [id number] DOB: Jan 07, 1961 Today's Date: 11/27/2021   History of Present Illness 61 yo admited 5/30 with hip pain and impaired gait. 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 PMhx: OA, HTN, DM, obesity, anxiety/depression   Clinical Impression   Pt reports homelessness, living in his car. He was walking with a cane and struggling with LB bathing and dressing due to hip pain and obesity. Pt presents with generalized weakness, impaired standing balance and pain in L hip and knee. He was able to stand with RW from chair and ambulate with min guard assist. He needs set up to max assist for ADLs. Recommending ST rehab in SNF.     Recommendations for follow up therapy are one component of a multi-disciplinary discharge planning process, led by the attending physician.  Recommendations may be updated based on patient status, additional functional criteria and insurance authorization.   Follow Up Recommendations  Skilled nursing-short term rehab (<3 hours/day)    Assistance Recommended at Discharge Intermittent Supervision/Assistance  Patient can return home with the following A little help with walking and/or transfers;A lot of help with bathing/dressing/bathroom;Assist for transportation;Help with stairs or ramp for entrance    Functional Status Assessment  Patient has had a recent decline in their functional status and demonstrates the ability to make significant improvements in function in a reasonable and predictable amount of time.  Equipment Recommendations  None recommended by OT    Recommendations for Other Services       Precautions / Restrictions Precautions Precautions: Fall Restrictions Weight Bearing Restrictions: No      Mobility Bed Mobility               General bed mobility comments: pt received in chair    Transfers Overall transfer  level: Needs assistance Equipment used: Rolling walker (2 wheels) Transfers: Sit to/from Stand Sit to Stand: Min guard           General transfer comment: from chair, increased time and effort      Balance Overall balance assessment: Needs assistance   Sitting balance-Leahy Scale: Good Sitting balance - Comments: able to reach down toward feet without LOB   Standing balance support: Bilateral upper extremity supported Standing balance-Leahy Scale: Poor Standing balance comment: reliance on RW for standing due to pain                           ADL either performed or assessed with clinical judgement   ADL Overall ADL's : Needs assistance/impaired Eating/Feeding: Independent   Grooming: Set up;Sitting   Upper Body Bathing: Set up;Sitting   Lower Body Bathing: Maximal assistance;Sit to/from stand   Upper Body Dressing : Set up;Sitting   Lower Body Dressing: Maximal assistance;Sit to/from stand   Toilet Transfer: Min guard;Ambulation;BSC/3in1;Rolling walker (2 wheels)   Toileting- Clothing Manipulation and Hygiene: Minimal assistance;Sitting/lateral lean       Functional mobility during ADLs: Min guard;Rolling walker (2 wheels)       Vision Ability to See in Adequate Light: 0 Adequate Patient Visual Report: No change from baseline       Perception     Praxis      Pertinent Vitals/Pain Pain Assessment Pain Assessment: Faces Faces Pain Scale: Hurts little more Pain Location: Rt hip and knee Pain Descriptors / Indicators: Aching, Guarding Pain Intervention(s): RN gave pain meds during session,  Monitored during session     Hand Dominance Right   Extremity/Trunk Assessment Upper Extremity Assessment Upper Extremity Assessment: Generalized weakness;RUE deficits/detail;LUE deficits/detail RUE Deficits / Details: longstanding shoulder limitations, pt reports arthritis LUE Deficits / Details: shoulder AROM WFL, but pt reports arthritis   Lower  Extremity Assessment Lower Extremity Assessment: RLE deficits/detail RLE Deficits / Details: 2+/5 hip flexion, 4/5 knee extension and flexion   Cervical / Trunk Assessment Cervical / Trunk Assessment: Other exceptions (obesity)   Communication Communication Communication: No difficulties   Cognition Arousal/Alertness: Awake/alert Behavior During Therapy: WFL for tasks assessed/performed Overall Cognitive Status: Within Functional Limits for tasks assessed                                       General Comments       Exercises     Shoulder Instructions      Home Living Family/patient expects to be discharged to:: Shelter/Homeless                                 Additional Comments: has been living in his car, stayed with a friend for awhile, plans to hopefully find a boarding house      Prior Functioning/Environment Prior Level of Function : Independent/Modified Independent             Mobility Comments: walks with cane ADLs Comments: reports struggling with LB ADLs due to pain and obesity        OT Problem List: Decreased strength;Impaired balance (sitting and/or standing);Decreased knowledge of use of DME or AE;Obesity;Pain      OT Treatment/Interventions: Self-care/ADL training;DME and/or AE instruction;Patient/family education;Balance training;Therapeutic activities    OT Goals(Current goals can be found in the care plan section) Acute Rehab OT Goals OT Goal Formulation: With patient Time For Goal Achievement: 12/11/21 Potential to Achieve Goals: Good ADL Goals Pt Will Perform Grooming: with modified independence;standing Pt Will Perform Lower Body Bathing: with modified independence;with adaptive equipment;sit to/from stand Pt Will Perform Lower Body Dressing: with modified independence;sit to/from stand;with adaptive equipment Pt Will Transfer to Toilet: with modified independence;ambulating;regular height toilet Pt Will  Perform Toileting - Clothing Manipulation and hygiene: with modified independence;sit to/from stand  OT Frequency: Min 2X/week    Co-evaluation              AM-PAC OT "6 Clicks" Daily Activity     Outcome Measure Help from another person eating meals?: None Help from another person taking care of personal grooming?: A Little Help from another person toileting, which includes using toliet, bedpan, or urinal?: A Little Help from another person bathing (including washing, rinsing, drying)?: A Lot Help from another person to put on and taking off regular upper body clothing?: None Help from another person to put on and taking off regular lower body clothing?: A Lot 6 Click Score: 18   End of Session Equipment Utilized During Treatment: Rolling walker (2 wheels);Gait belt  Activity Tolerance: Patient tolerated treatment well Patient left: in chair;with call bell/phone within reach;with chair alarm set  OT Visit Diagnosis: Unsteadiness on feet (R26.81);Other abnormalities of gait and mobility (R26.89);Hemiplegia and hemiparesis;Muscle weakness (generalized) (M62.81)                Time: 5784-6962 OT Time Calculation (min): 22 min Charges:  OT General Charges $OT Visit: 1 Visit OT Evaluation $  OT Eval Moderate Complexity: 1 Mod  Martie RoundJulie Johathon Overturf, OTR/L Acute Rehabilitation Services Pager: [phone] Office: [phone]   Evern BioMayberry, Lynelle Weiler Lynn 11/27/2021, 11:11 AM

## 2021-11-28 ENCOUNTER — Inpatient Hospital Stay (HOSPITAL_COMMUNITY): Payer: Medicare Other

## 2021-11-28 ENCOUNTER — Inpatient Hospital Stay (HOSPITAL_COMMUNITY): Payer: Medicare Other | Admitting: Certified Registered Nurse Anesthetist

## 2021-11-28 ENCOUNTER — Encounter (HOSPITAL_COMMUNITY)
Admission: EM | Disposition: A | Discharge status: Skilled Nursing Facility | Payer: Self-pay | Source: Home / Self Care | Attending: Internal Medicine

## 2021-11-28 ENCOUNTER — Other Ambulatory Visit: Payer: Self-pay

## 2021-11-28 ENCOUNTER — Encounter (HOSPITAL_COMMUNITY): Payer: Self-pay | Admitting: Internal Medicine

## 2021-11-28 DIAGNOSIS — I10 Essential (primary) hypertension: Secondary | ICD-10-CM

## 2021-11-28 DIAGNOSIS — R262 Difficulty in walking, not elsewhere classified: Secondary | ICD-10-CM | POA: Diagnosis not present

## 2021-11-28 DIAGNOSIS — Z7984 Long term (current) use of oral hypoglycemic drugs: Secondary | ICD-10-CM

## 2021-11-28 DIAGNOSIS — E119 Type 2 diabetes mellitus without complications: Secondary | ICD-10-CM

## 2021-11-28 DIAGNOSIS — M879 Osteonecrosis, unspecified: Secondary | ICD-10-CM

## 2021-11-28 DIAGNOSIS — I159 Secondary hypertension, unspecified: Secondary | ICD-10-CM | POA: Diagnosis not present

## 2021-11-28 DIAGNOSIS — M8700 Idiopathic aseptic necrosis of unspecified bone: Secondary | ICD-10-CM | POA: Diagnosis not present

## 2021-11-28 HISTORY — PX: ANTERIOR APPROACH HEMI HIP ARTHROPLASTY: SHX6690

## 2021-11-28 LAB — CBC WITH DIFFERENTIAL/PLATELET
Abs Immature Granulocytes: 0.06 10*3/uL (ref 0.00–0.07)
Basophils Absolute: 0 10*3/uL (ref 0.0–0.1)
Basophils Relative: 0 %
Eosinophils Absolute: 0.1 10*3/uL (ref 0.0–0.5)
Eosinophils Relative: 1 %
HCT: 32.7 % — ABNORMAL LOW (ref 39.0–52.0)
Hemoglobin: 11 g/dL — ABNORMAL LOW (ref 13.0–17.0)
Immature Granulocytes: 1 %
Lymphocytes Relative: 23 %
Lymphs Abs: 2.2 10*3/uL (ref 0.7–4.0)
MCH: 32 pg (ref 26.0–34.0)
MCHC: 33.6 g/dL (ref 30.0–36.0)
MCV: 95.1 fL (ref 80.0–100.0)
Monocytes Absolute: 1.1 10*3/uL — ABNORMAL HIGH (ref 0.1–1.0)
Monocytes Relative: 11 %
Neutro Abs: 6.1 10*3/uL (ref 1.7–7.7)
Neutrophils Relative %: 64 %
Platelets: 282 10*3/uL (ref 150–400)
RBC: 3.44 MIL/uL — ABNORMAL LOW (ref 4.22–5.81)
RDW: 13.3 % (ref 11.5–15.5)
WBC: 9.5 10*3/uL (ref 4.0–10.5)
nRBC: 0 % (ref 0.0–0.2)

## 2021-11-28 LAB — BASIC METABOLIC PANEL
Anion gap: 8 (ref 5–15)
BUN: 39 mg/dL — ABNORMAL HIGH (ref 6–20)
CO2: 25 mmol/L (ref 22–32)
Calcium: 9.5 mg/dL (ref 8.9–10.3)
Chloride: 98 mmol/L (ref 98–111)
Creatinine, Ser: 1.29 mg/dL — ABNORMAL HIGH (ref 0.61–1.24)
GFR, Estimated: >60 mL/min (ref >60)
Glucose, Bld: 103 mg/dL — ABNORMAL HIGH (ref 70–99)
Potassium: 4.5 mmol/L (ref 3.5–5.1)
Sodium: 131 mmol/L — ABNORMAL LOW (ref 135–145)

## 2021-11-28 LAB — TYPE AND SCREEN
ABO/RH(D): B POS
Antibody Screen: NEGATIVE

## 2021-11-28 LAB — HEMOGLOBIN A1C
Hgb A1c MFr Bld: 6.7 % — ABNORMAL HIGH (ref 4.8–5.6)
Mean Plasma Glucose: 145.59 mg/dL

## 2021-11-28 LAB — GLUCOSE, CAPILLARY
Glucose-Capillary: 111 mg/dL — ABNORMAL HIGH (ref 70–99)
Glucose-Capillary: 126 mg/dL — ABNORMAL HIGH (ref 70–99)
Glucose-Capillary: 105 mg/dL — ABNORMAL HIGH (ref 70–99)
Glucose-Capillary: 150 mg/dL — ABNORMAL HIGH (ref 70–99)
Glucose-Capillary: 126 mg/dL — ABNORMAL HIGH (ref 70–99)

## 2021-11-28 LAB — SURGICAL PCR SCREEN
MRSA, PCR: NEGATIVE
Staphylococcus aureus: NEGATIVE

## 2021-11-28 LAB — ABO/RH: ABO/RH(D): B POS

## 2021-11-28 SURGERY — HEMIARTHROPLASTY, HIP, DIRECT ANTERIOR APPROACH, FOR FRACTURE
Anesthesia: Spinal | Site: Hip | Laterality: Right

## 2021-11-28 MED ORDER — PHENYLEPHRINE 80 MCG/ML (10ML) SYRINGE FOR IV PUSH (FOR BLOOD PRESSURE SUPPORT)
PREFILLED_SYRINGE | INTRAVENOUS | Status: AC
  Filled 2021-11-28: qty 10

## 2021-11-28 MED ORDER — PROPOFOL 10 MG/ML IV BOLUS
INTRAVENOUS | Status: AC
  Filled 2021-11-28: qty 20

## 2021-11-28 MED ORDER — LIDOCAINE 2% (20 MG/ML) 5 ML SYRINGE
INTRAMUSCULAR | Status: AC
  Filled 2021-11-28: qty 5

## 2021-11-28 MED ORDER — KETOROLAC TROMETHAMINE 30 MG/ML IJ SOLN
INTRAMUSCULAR | Status: DC | PRN
  Administered 2021-11-28: 30 mg via INTRAMUSCULAR

## 2021-11-28 MED ORDER — MIDAZOLAM HCL 2 MG/2ML IJ SOLN
INTRAMUSCULAR | Status: DC | PRN
  Administered 2021-11-28: 2 mg via INTRAVENOUS

## 2021-11-28 MED ORDER — LACTATED RINGERS IV SOLN: INTRAVENOUS | Status: DC | PRN

## 2021-11-28 MED ORDER — MORPHINE SULFATE (PF) 2 MG/ML IV SOLN: 0.5000 mg | INTRAVENOUS | Status: DC | PRN

## 2021-11-28 MED ORDER — ORAL CARE MOUTH RINSE: 15.0000 mL | Freq: Once | OROMUCOSAL | Status: AC

## 2021-11-28 MED ORDER — ONDANSETRON HCL 4 MG PO TABS: 4.0000 mg | ORAL_TABLET | Freq: Four times a day (QID) | ORAL | Status: DC | PRN

## 2021-11-28 MED ORDER — FENTANYL CITRATE (PF) 100 MCG/2ML IJ SOLN
INTRAMUSCULAR | Status: DC | PRN
  Administered 2021-11-28: 50 ug via INTRAVENOUS

## 2021-11-28 MED ORDER — METHOCARBAMOL 1000 MG/10ML IJ SOLN: 500.0000 mg | Freq: Four times a day (QID) | INTRAVENOUS | Status: DC | PRN

## 2021-11-28 MED ORDER — METOCLOPRAMIDE HCL 5 MG/ML IJ SOLN: 5.0000 mg | Freq: Three times a day (TID) | INTRAMUSCULAR | Status: DC | PRN

## 2021-11-28 MED ORDER — OXYCODONE HCL 5 MG/5ML PO SOLN: 5.0000 mg | Freq: Once | ORAL | Status: DC | PRN

## 2021-11-28 MED ORDER — SODIUM CHLORIDE 0.9 % IR SOLN
Status: DC | PRN
  Administered 2021-11-28: 1000 mL

## 2021-11-28 MED ORDER — PROPOFOL 500 MG/50ML IV EMUL
INTRAVENOUS | Status: DC | PRN
  Administered 2021-11-28: 75 ug/kg/min via INTRAVENOUS

## 2021-11-28 MED ORDER — PHENYLEPHRINE HCL-NACL 20-0.9 MG/250ML-% IV SOLN
INTRAVENOUS | Status: DC | PRN
  Administered 2021-11-28: 50 ug/min via INTRAVENOUS

## 2021-11-28 MED ORDER — ALBUMIN HUMAN 5 % IV SOLN: INTRAVENOUS | Status: DC | PRN

## 2021-11-28 MED ORDER — HYDROMORPHONE HCL 1 MG/ML IJ SOLN: 0.2500 mg | INTRAMUSCULAR | Status: DC | PRN

## 2021-11-28 MED ORDER — ACETAMINOPHEN 325 MG PO TABS: 325.0000 mg | ORAL_TABLET | Freq: Four times a day (QID) | ORAL | Status: DC | PRN

## 2021-11-28 MED ORDER — SODIUM CHLORIDE 0.9 % IR SOLN
Status: DC | PRN
  Administered 2021-11-28: 3000 mL

## 2021-11-28 MED ORDER — HYDROCODONE-ACETAMINOPHEN 5-325 MG PO TABS
1.0000 | ORAL_TABLET | ORAL | Status: DC | PRN
  Administered 2021-11-28: 1 via ORAL
  Administered 2021-11-29: 2 via ORAL
  Administered 2021-11-29 – 2021-11-30 (×3): 1 via ORAL
  Administered 2021-12-01: 2 via ORAL
  Administered 2021-12-01 (×2): 1 via ORAL
  Administered 2021-12-02: 2 via ORAL
  Administered 2021-12-02 – 2021-12-04 (×8): 1 via ORAL
  Filled 2021-11-28 (×8): qty 1
  Filled 2021-11-28: qty 2
  Filled 2021-11-28: qty 1
  Filled 2021-11-28: qty 2
  Filled 2021-11-28 (×2): qty 1
  Filled 2021-11-28: qty 2
  Filled 2021-11-28 (×3): qty 1

## 2021-11-28 MED ORDER — BUPIVACAINE-EPINEPHRINE (PF) 0.5% -1:200000 IJ SOLN
INTRAMUSCULAR | Status: DC | PRN
  Administered 2021-11-28: 30 mL

## 2021-11-28 MED ORDER — ALUM & MAG HYDROXIDE-SIMETH 200-200-20 MG/5ML PO SUSP: 30.0000 mL | ORAL | Status: DC | PRN

## 2021-11-28 MED ORDER — SODIUM CHLORIDE 0.9 % IV SOLN: INTRAVENOUS | Status: DC

## 2021-11-28 MED ORDER — ONDANSETRON HCL 4 MG/2ML IJ SOLN
INTRAMUSCULAR | Status: AC
  Filled 2021-11-28: qty 4

## 2021-11-28 MED ORDER — FENTANYL CITRATE (PF) 250 MCG/5ML IJ SOLN
INTRAMUSCULAR | Status: AC
  Filled 2021-11-28: qty 5

## 2021-11-28 MED ORDER — OXYCODONE HCL 5 MG PO TABS: 5.0000 mg | ORAL_TABLET | Freq: Once | ORAL | Status: DC | PRN

## 2021-11-28 MED ORDER — DOCUSATE SODIUM 100 MG PO CAPS
100.0000 mg | ORAL_CAPSULE | Freq: Two times a day (BID) | ORAL | Status: DC
  Administered 2021-11-29 – 2021-12-04 (×11): 100 mg via ORAL
  Filled 2021-11-28 (×12): qty 1

## 2021-11-28 MED ORDER — BUPIVACAINE-EPINEPHRINE 0.5% -1:200000 IJ SOLN
INTRAMUSCULAR | Status: AC
  Filled 2021-11-28: qty 1

## 2021-11-28 MED ORDER — METOCLOPRAMIDE HCL 5 MG PO TABS: 5.0000 mg | ORAL_TABLET | Freq: Three times a day (TID) | ORAL | Status: DC | PRN

## 2021-11-28 MED ORDER — MUPIROCIN 2 % EX OINT
1.0000 "application " | TOPICAL_OINTMENT | Freq: Two times a day (BID) | CUTANEOUS | Status: AC
  Administered 2021-11-28 – 2021-12-02 (×10): 1 via NASAL
  Filled 2021-11-28 (×4): qty 22

## 2021-11-28 MED ORDER — MENTHOL 3 MG MT LOZG
1.0000 | LOZENGE | OROMUCOSAL | Status: DC | PRN
Start: 2021-11-28 — End: 2021-11-29

## 2021-11-28 MED ORDER — SODIUM CHLORIDE (PF) 0.9 % IJ SOLN
INTRAMUSCULAR | Status: DC | PRN
  Administered 2021-11-28: 30 mL

## 2021-11-28 MED ORDER — IRRISEPT - 450ML BOTTLE WITH 0.05% CHG IN STERILE WATER, USP 99.95% OPTIME
TOPICAL | Status: DC | PRN
  Administered 2021-11-28: 450 mL

## 2021-11-28 MED ORDER — POLYETHYLENE GLYCOL 3350 17 G PO PACK: 17.0000 g | PACK | Freq: Every day | ORAL | Status: DC | PRN

## 2021-11-28 MED ORDER — LACTATED RINGERS IV SOLN: INTRAVENOUS | Status: DC

## 2021-11-28 MED ORDER — CHLORHEXIDINE GLUCONATE CLOTH 2 % EX PADS
6.0000 | MEDICATED_PAD | Freq: Every day | CUTANEOUS | Status: DC
  Administered 2021-11-28 – 2021-11-30 (×3): 6 via TOPICAL

## 2021-11-28 MED ORDER — MIDAZOLAM HCL 2 MG/2ML IJ SOLN
INTRAMUSCULAR | Status: AC
  Filled 2021-11-28: qty 2

## 2021-11-28 MED ORDER — CHLORHEXIDINE GLUCONATE 0.12 % MT SOLN: 15.0000 mL | Freq: Once | OROMUCOSAL | Status: AC

## 2021-11-28 MED ORDER — DIPHENHYDRAMINE HCL 12.5 MG/5ML PO ELIX: 12.5000 mg | ORAL_SOLUTION | ORAL | Status: DC | PRN

## 2021-11-28 MED ORDER — PROPOFOL 10 MG/ML IV BOLUS
INTRAVENOUS | Status: DC | PRN
  Administered 2021-11-28 (×3): 20 mg via INTRAVENOUS

## 2021-11-28 MED ORDER — ONDANSETRON HCL 4 MG/2ML IJ SOLN
INTRAMUSCULAR | Status: DC | PRN
  Administered 2021-11-28: 4 mg via INTRAVENOUS

## 2021-11-28 MED ORDER — KETOROLAC TROMETHAMINE 30 MG/ML IJ SOLN
INTRAMUSCULAR | Status: AC
Start: 2021-11-28 — End: ?
  Filled 2021-11-28: qty 1

## 2021-11-28 MED ORDER — ONDANSETRON HCL 4 MG/2ML IJ SOLN: 4.0000 mg | Freq: Once | INTRAMUSCULAR | Status: DC | PRN

## 2021-11-28 MED ORDER — CHLORHEXIDINE GLUCONATE 0.12 % MT SOLN
OROMUCOSAL | Status: AC
  Administered 2021-11-28: 15 mL via OROMUCOSAL
  Filled 2021-11-28: qty 15

## 2021-11-28 MED ORDER — 0.9 % SODIUM CHLORIDE (POUR BTL) OPTIME
TOPICAL | Status: DC | PRN
  Administered 2021-11-28: 1000 mL

## 2021-11-28 MED ORDER — INSULIN ASPART 100 UNIT/ML IJ SOLN: 0.0000 [IU] | INTRAMUSCULAR | Status: DC | PRN

## 2021-11-28 MED ORDER — SENNA 8.6 MG PO TABS
1.0000 | ORAL_TABLET | Freq: Two times a day (BID) | ORAL | Status: DC
  Administered 2021-11-29 – 2021-12-04 (×11): 8.6 mg via ORAL
  Filled 2021-11-28 (×11): qty 1

## 2021-11-28 MED ORDER — METHOCARBAMOL 500 MG PO TABS
500.0000 mg | ORAL_TABLET | Freq: Four times a day (QID) | ORAL | Status: DC | PRN
  Administered 2021-11-29: 500 mg via ORAL
  Filled 2021-11-28: qty 1

## 2021-11-28 MED ORDER — ASPIRIN 81 MG PO CHEW
81.0000 mg | CHEWABLE_TABLET | Freq: Two times a day (BID) | ORAL | Status: DC
  Administered 2021-11-28 – 2021-12-04 (×12): 81 mg via ORAL
  Filled 2021-11-28 (×12): qty 1

## 2021-11-28 MED ORDER — ONDANSETRON HCL 4 MG/2ML IJ SOLN: 4.0000 mg | Freq: Four times a day (QID) | INTRAMUSCULAR | Status: DC | PRN

## 2021-11-28 MED ORDER — HYDROCODONE-ACETAMINOPHEN 7.5-325 MG PO TABS
1.0000 | ORAL_TABLET | ORAL | Status: DC | PRN
  Administered 2021-11-28 – 2021-11-29 (×3): 1 via ORAL
  Filled 2021-11-28 (×3): qty 1

## 2021-11-28 MED ORDER — CEFAZOLIN SODIUM-DEXTROSE 2-4 GM/100ML-% IV SOLN
2.0000 g | Freq: Four times a day (QID) | INTRAVENOUS | Status: AC
  Administered 2021-11-28 – 2021-11-29 (×2): 2 g via INTRAVENOUS
  Filled 2021-11-28 (×2): qty 100

## 2021-11-28 MED ORDER — PHENOL 1.4 % MT LIQD: 1.0000 | OROMUCOSAL | Status: DC | PRN

## 2021-11-28 MED ORDER — BUPIVACAINE IN DEXTROSE 0.75-8.25 % IT SOLN
INTRATHECAL | Status: DC | PRN
  Administered 2021-11-28: 1.6 mL via INTRATHECAL

## 2021-11-28 MED ORDER — PHENYLEPHRINE 80 MCG/ML (10ML) SYRINGE FOR IV PUSH (FOR BLOOD PRESSURE SUPPORT)
PREFILLED_SYRINGE | INTRAVENOUS | Status: DC | PRN
  Administered 2021-11-28 (×2): 160 ug via INTRAVENOUS
  Administered 2021-11-28 (×2): 80 ug via INTRAVENOUS

## 2021-11-28 SURGICAL SUPPLY — 73 items
ACETAB SHELL MULTIHOLE 58 (Hips) ×2 IMPLANT
ALCOHOL 70% 16 OZ (MISCELLANEOUS) ×2 IMPLANT
BAG COUNTER SPONGE SURGICOUNT (BAG) ×2 IMPLANT
BIT DRILL RINGLOC QUICK CONN (BIT) IMPLANT
BLADE CLIPPER SURG (BLADE) IMPLANT
CHLORAPREP W/TINT 26 (MISCELLANEOUS) ×2 IMPLANT
COVER SURGICAL LIGHT HANDLE (MISCELLANEOUS) ×2 IMPLANT
DERMABOND ADVANCED (GAUZE/BANDAGES/DRESSINGS) ×1
DERMABOND ADVANCED .7 DNX12 (GAUZE/BANDAGES/DRESSINGS) IMPLANT
DRAPE C-ARM 42X72 X-RAY (DRAPES) ×2 IMPLANT
DRAPE STERI IOBAN 125X83 (DRAPES) ×2 IMPLANT
DRAPE U-SHAPE 47X51 STRL (DRAPES) ×6 IMPLANT
DRESSING AQUACEL AG SP 3.5X10 (GAUZE/BANDAGES/DRESSINGS) IMPLANT
DRILL BIT RINGLOC QUICK CONN (BIT) ×2
DRSG AQUACEL AG SP 3.5X10 (GAUZE/BANDAGES/DRESSINGS) ×2
DRSG TEGADERM 4X4.75 (GAUZE/BANDAGES/DRESSINGS) ×1 IMPLANT
ELECT BLADE 4.0 EZ CLEAN MEGAD (MISCELLANEOUS) ×2
ELECT PENCIL ROCKER SW 15FT (MISCELLANEOUS) ×2 IMPLANT
ELECT REM PT RETURN 9FT ADLT (ELECTROSURGICAL) ×2
ELECTRODE BLDE 4.0 EZ CLN MEGD (MISCELLANEOUS) ×1 IMPLANT
ELECTRODE REM PT RTRN 9FT ADLT (ELECTROSURGICAL) ×1 IMPLANT
EVACUATOR 1/8 PVC DRAIN (DRAIN) IMPLANT
GAUZE SPONGE 2X2 8PLY STRL LF (GAUZE/BANDAGES/DRESSINGS) IMPLANT
GLOVE BIO SURGEON STRL SZ8.5 (GLOVE) ×4 IMPLANT
GLOVE BIOGEL M 7.0 STRL (GLOVE) ×2 IMPLANT
GLOVE BIOGEL PI IND STRL 7.5 (GLOVE) ×1 IMPLANT
GLOVE BIOGEL PI IND STRL 8.5 (GLOVE) ×1 IMPLANT
GLOVE BIOGEL PI INDICATOR 7.5 (GLOVE) ×1
GLOVE BIOGEL PI INDICATOR 8.5 (GLOVE) ×1
GOWN STRL REUS W/ TWL LRG LVL3 (GOWN DISPOSABLE) ×2 IMPLANT
GOWN STRL REUS W/ TWL XL LVL3 (GOWN DISPOSABLE) ×1 IMPLANT
GOWN STRL REUS W/TWL 2XL LVL3 (GOWN DISPOSABLE) ×2 IMPLANT
GOWN STRL REUS W/TWL LRG LVL3 (GOWN DISPOSABLE) ×4
GOWN STRL REUS W/TWL XL LVL3 (GOWN DISPOSABLE) ×2
HANDPIECE INTERPULSE COAX TIP (DISPOSABLE) ×2
HEAD CERAMIC BIOLOX 36 T1 +3 (Head) ×1 IMPLANT
HOOD PEEL AWAY FACE SHEILD DIS (HOOD) ×5 IMPLANT
JET LAVAGE IRRISEPT WOUND (IRRIGATION / IRRIGATOR) ×2
KIT BASIN OR (CUSTOM PROCEDURE TRAY) ×2 IMPLANT
KIT TURNOVER KIT B (KITS) ×2 IMPLANT
LAVAGE JET IRRISEPT WOUND (IRRIGATION / IRRIGATOR) ×1 IMPLANT
LINER ACETAB OFF G7 5 36 +5 (Liner) ×1 IMPLANT
MANIFOLD NEPTUNE II (INSTRUMENTS) ×2 IMPLANT
MARKER SKIN DUAL TIP RULER LAB (MISCELLANEOUS) ×4 IMPLANT
NDL SPNL 18GX3.5 QUINCKE PK (NEEDLE) ×1 IMPLANT
NEEDLE SPNL 18GX3.5 QUINCKE PK (NEEDLE) ×2 IMPLANT
NS IRRIG 1000ML POUR BTL (IV SOLUTION) ×2 IMPLANT
PACK TOTAL JOINT (CUSTOM PROCEDURE TRAY) ×2 IMPLANT
PACK UNIVERSAL I (CUSTOM PROCEDURE TRAY) ×2 IMPLANT
PAD ARMBOARD 7.5X6 YLW CONV (MISCELLANEOUS) ×4 IMPLANT
SAW OSC TIP CART 19.5X105X1.3 (SAW) ×2 IMPLANT
SCREW BONE 6.5X35 SELF TAP (Screw) ×1 IMPLANT
SEALER BIPOLAR AQUA 6.0 (INSTRUMENTS) ×1 IMPLANT
SET HNDPC FAN SPRY TIP SCT (DISPOSABLE) ×1 IMPLANT
SHELL ACETAB MULTIHOLE 58 (Hips) IMPLANT
SOL PREP POV-IOD 4OZ 10% (MISCELLANEOUS) ×2 IMPLANT
SPONGE GAUZE 2X2 STER 10/PKG (GAUZE/BANDAGES/DRESSINGS) ×1
STEM FEM CMTLS TL 18X133 (Stem) ×1 IMPLANT
SUT ETHIBOND NAB CT1 #1 30IN (SUTURE) ×4 IMPLANT
SUT MNCRL AB 3-0 PS2 18 (SUTURE) ×2 IMPLANT
SUT MON AB 2-0 CT1 36 (SUTURE) ×3 IMPLANT
SUT VIC AB 1 CT1 27 (SUTURE) ×2
SUT VIC AB 1 CT1 27XBRD ANBCTR (SUTURE) ×1 IMPLANT
SUT VIC AB 2-0 CT1 27 (SUTURE) ×2
SUT VIC AB 2-0 CT1 TAPERPNT 27 (SUTURE) ×1 IMPLANT
SUT VLOC 180 0 24IN GS25 (SUTURE) ×2 IMPLANT
SYR 50ML LL SCALE MARK (SYRINGE) ×2 IMPLANT
TOWEL GREEN STERILE (TOWEL DISPOSABLE) ×2 IMPLANT
TOWEL GREEN STERILE FF (TOWEL DISPOSABLE) ×2 IMPLANT
TRAY CATH 16FR W/PLASTIC CATH (SET/KITS/TRAYS/PACK) IMPLANT
TRAY FOLEY W/BAG SLVR 16FR (SET/KITS/TRAYS/PACK)
TRAY FOLEY W/BAG SLVR 16FR ST (SET/KITS/TRAYS/PACK) IMPLANT
WATER STERILE IRR 1000ML POUR (IV SOLUTION) ×6 IMPLANT

## 2021-11-28 NOTE — TOC Initial Note (Signed)
Transition of Care [facility]) - Initial/Assessment Note    Patient Details  Name: Sean [name] MRN: [id number] Date of Birth: 12-03-1960  [facility]) CM/SW Contact:    Mearl Latin, LCSW Phone Number: 11/28/2021, 5:44 PM  Clinical Narrative:                 CSW following for SNF placement for rehab as patient currently living in his car and unable to care for himself. Will follow up with patient post-surgery and discuss SNF options.   Expected Discharge Plan: Skilled Nursing Facility Barriers to Discharge: Continued Medical Work up, SNF Pending bed offer   Patient Goals and CMS Choice Patient states their goals for this hospitalization and ongoing recovery are:: Rehab CMS Medicare.gov Compare Post Acute Care list provided to:: Patient Choice offered to / list presented to : Patient  Expected Discharge Plan and Services Expected Discharge Plan: Skilled Nursing Facility In-house Referral: Clinical Social Work   Post Acute Care Choice: Skilled Nursing Facility Living arrangements for the past 2 months: Homeless [employer])                                      Prior Living Arrangements/Services Living arrangements for the past 2 months: Homeless [employer]) Lives with:: Self Patient language and need for interpreter reviewed:: Yes Do you feel safe going back to the place where you live?: Yes      Need for Family Participation in Patient Care: No (Comment) Care giver support system in place?: No (comment)   Criminal Activity/Legal Involvement Pertinent to Current Situation/Hospitalization: No - Comment as needed  Activities of Daily Living Home Assistive Devices/Equipment: Cane (specify quad or straight) ADL Screening (condition at time of admission) Patient's cognitive ability adequate to safely complete daily activities?: Yes Is the patient deaf or have difficulty hearing?: No Does the patient have difficulty seeing, even when wearing glasses/contacts?: No Does the  patient have difficulty concentrating, remembering, or making decisions?: Yes Patient able to express need for assistance with ADLs?: Yes Does the patient have difficulty dressing or bathing?: Yes Independently performs ADLs?: No Communication: Independent Dressing (OT): Needs assistance Is this a change from baseline?: Pre-admission baseline Grooming: Needs assistance Is this a change from baseline?: Pre-admission baseline Feeding: Independent Bathing: Needs assistance Is this a change from baseline?: Pre-admission baseline Toileting: Needs assistance In/Out Bed: Dependent Walks in Home: Needs assistance Is this a change from baseline?: Pre-admission baseline Does the patient have difficulty walking or climbing stairs?: Yes Weakness of Legs: Both Weakness of Arms/Hands: None  Permission Sought/Granted Permission sought to share information with : Magazine features editor, Family Supports Permission granted to share information with : Yes, Verbal Permission Granted     Permission granted to share info w AGENCY: SNFs        Emotional Assessment Appearance:: Appears older than stated age     Orientation: : Oriented to Self, Oriented to Place, Oriented to  Time, Oriented to Situation Alcohol / Substance Use: Not Applicable Psych Involvement: No (comment)  Admission diagnosis:  Avascular necrosis (HCC) [M87.00] Closed fracture of head of right femur, initial encounter (HCC) [S72.051A] Impaired ambulation [R26.2] Patient Active Problem List   Diagnosis Date Noted   Avascular necrosis of bone of hip, right (HCC) 11/26/2021   Controlled type 2 diabetes mellitus without complication, without long-term current use of insulin (HCC) 11/26/2021   HTN (hypertension) 11/26/2021  Obesity, Class III, BMI 40-49.9 (morbid obesity) (HCC) 11/26/2021   Impaired ambulation 11/26/2021   PULMONARY EMBOLISM, HX OF 04/19/2008   PCP:  [facility]:   [id number] [place],  - [address] [place] [address] Phone: [phone] Fax: [phone]     Social Determinants of Health (SDOH) Interventions    Readmission Risk Interventions     View : No data to display.

## 2021-11-28 NOTE — Interval H&P Note (Signed)
History and Physical Interval Note:  11/28/2021 2:27 PM  Sean [name]  has presented today for surgery, with the diagnosis of right femur fracture.  The various methods of treatment have been discussed with the patient and family. After consideration of risks, benefits and other options for treatment, the patient has consented to  Procedure(s): ANTERIOR APPROACH TOTAL HIP (Right) as a surgical intervention.  The patient's history has been reviewed, patient examined, no change in status, stable for surgery.  I have reviewed the patient's chart and labs.  Questions were answered to the patient's satisfaction.    The risks, benefits, and alternatives were discussed with the patient. There are risks associated with the surgery including, but not limited to, problems with anesthesia (death), infection, instability (giving out of the joint), dislocation, differences in leg length/angulation/rotation, fracture of bones, loosening or failure of implants, hematoma (blood accumulation) which may require surgical drainage, blood clots, pulmonary embolism, nerve injury (foot drop and lateral thigh numbness), and blood vessel injury. The patient understands these risks and elects to proceed.    Iline Oven Joshuwa Vecchio

## 2021-11-28 NOTE — Anesthesia Preprocedure Evaluation (Signed)
Anesthesia Evaluation  Patient identified by MRN, date of birth, ID band Patient awake    Reviewed: Allergy & Precautions, NPO status , Patient's Chart, lab work & pertinent test results, reviewed documented beta blocker date and time   Airway Mallampati: II  TM Distance: >3 FB Neck ROM: Full    Dental  (+) Poor Dentition, Missing, Dental Advisory Given   Pulmonary former smoker,    Pulmonary exam normal breath sounds clear to auscultation       Cardiovascular hypertension, Pt. on medications Normal cardiovascular exam Rhythm:Regular Rate:Normal     Neuro/Psych PSYCHIATRIC DISORDERS Depression negative neurological ROS     GI/Hepatic negative GI ROS, Neg liver ROS,   Endo/Other  diabetes, Well Controlled, Type 2, Oral Hypoglycemic AgentsMorbid obesityHyperlipidemia  Renal/GU negative Renal ROS  negative genitourinary   Musculoskeletal  (+) Arthritis , Osteoarthritis,  AVN right hip   Abdominal (+) + obese,   Peds  Hematology   Anesthesia Other Findings   Reproductive/Obstetrics                             Anesthesia Physical Anesthesia Plan  ASA: 3  Anesthesia Plan: Spinal   Post-op Pain Management: Minimal or no pain anticipated   Induction: Intravenous  PONV Risk Score and Plan: 2 and Treatment may vary due to age or medical condition, Ondansetron and Midazolam  Airway Management Planned: Natural Airway and Simple Face Mask  Additional Equipment:   Intra-op Plan:   Post-operative Plan: Extubation in OR  Informed Consent: I have reviewed the patients History and Physical, chart, labs and discussed the procedure including the risks, benefits and alternatives for the proposed anesthesia with the patient or authorized representative who has indicated his/her understanding and acceptance.     Dental advisory given  Plan Discussed with: CRNA and Anesthesiologist  Anesthesia  Plan Comments:         Anesthesia Quick Evaluation

## 2021-11-28 NOTE — Progress Notes (Signed)
See above                        PROGRESS NOTE        PATIENT DETAILS Name: Sean [name] Age: 61 y.o. Sex: male Date of Birth: 1961-02-25 Admit Date: 11/25/2021 Admitting Physician Marrion Coy, MD [id number], [facility]  Brief Summary: Patient is a 61 y.o.  male with history of chronic right>left hip pain-presented with intractable right hip pain (not relieved by oral narcotics)-with significant leukocytosis unable to ambulate-and subsequently admitted to the hospitalist service.   Significant events: 5/30>> presented to ED with intractable right hip pain-unable to ambulate.    Significant studies: 5/30>> x-ray right knee: No acute radiographic abnormality of the right knee. 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  Small complex right hip joint effusion-likely reactive. 5/31>> MRI lumbosacral spine: Bulky L5-S1 disc osteophyte 6/01>> CXR: No PNA  Significant microbiology data: 6/1>> blood cultures: No growth  Procedures: None  Consults: Orthopedics  Subjective: Lying comfortably in bed.  Pain controlled.  Objective: Vitals: Blood pressure 112/80, pulse 91, temperature 98 F (36.7 C), temperature source Oral, resp. rate 14, height 5\' 6"  (1.676 m), weight 127 kg, SpO2 92 %.   Exam: Gen Exam:Alert awake-not in any distress HEENT:atraumatic, normocephalic Chest: B/L clear to auscultation anteriorly CVS:S1S2 regular Abdomen:soft non tender, non distended Extremities:no edema Neurology: Non focal Skin: no rash   Pertinent Labs/Radiology:    Latest Ref Rng & Units 11/28/2021    1:13 AM 11/27/2021    2:26 AM 11/26/2021    9:23 AM  CBC  WBC 4.0 - 10.5 K/uL 9.5   18.6   15.0    Hemoglobin 13.0 - 17.0 g/dL 11/28/2021   57.0   17.7    Hematocrit 39.0 - 52.0 % 32.7   32.6   38.1    Platelets 150 - 400 K/uL 282   259   242      Lab Results  Component Value  Date   NA 131 (L) 11/28/2021   K 4.5 11/28/2021   CL 98 11/28/2021   CO2 25 11/28/2021       Assessment/Plan: Intractable right hip pain: Due to avascular necrosis and resultant subchondral fracture-barely able to ambulate due to severe pain.  Reevaluated by orthopedics-plan hip replacement later today.  Continue supportive care with as needed narcotics.    Leukocytosis: No clear foci of infection apparent-CXR/blood cultures/UA all negative for infection.  Thankfully leukocytosis has resolved.  No evidence of sepsis or SIRS physiology at this point.  Hyponatremia: Mild-stable for close monitoring.  HTN: Stable-continue lisinopril/amlodipine.  DM-2 (A1c 6.7 on 6/2): Continue SSI-resume metformin on discharge.    Recent Labs    11/27/21 2124 11/28/21 0939 11/28/21 1159  GLUCAP 126* 111* 126*     Anxiety/depression: Stable-continue trazodone/risperidone/Zoloft.  Homeless: Living in a car prior to this hospitalization-probably will require SNF post hip replacement.  Morbid Obesity: Estimated body mass index is 45.19 kg/m as calculated from the following:   Height as of this encounter: 5\' 6"  (1.676 m).   Weight as of this encounter: 127 kg.   Code status:   Code Status: Full Code   DVT Prophylaxis:    Family Communication: None at bedside   Disposition Plan: Status is: Observation The patient will require care spanning > 2 midnights and should be moved to inpatient because: Intractable  right hip pain-worsening leukocytosis-concern for infection with SIRS physiology-work-up in progress-not yet stable for discharge.   Planned Discharge Destination:Skilled nursing facility   Diet: Diet Order             Diet NPO time specified Except for: Sips with Meds  Diet effective midnight                     Antimicrobial agents: Anti-infectives (From admission, onward)    Start     Dose/Rate Route Frequency Ordered Stop   11/28/21 0800  ceFAZolin (ANCEF) IVPB  3g/100 mL premix        3 g 200 mL/hr over 30 Minutes Intravenous On call to O.R. 11/27/21 2213 11/29/21 0559        MEDICATIONS: Scheduled Meds:  [MAR Hold] acetaminophen  1,000 mg Oral Q8H   [MAR Hold] amLODipine  5 mg Oral Daily   [MAR Hold] benztropine  0.5 mg Oral QHS   chlorhexidine       [MAR Hold] FLUoxetine  40 mg Oral q morning   [MAR Hold] insulin aspart  0-9 Units Subcutaneous TID WC   [MAR Hold] lisinopril  20 mg Oral Daily   [MAR Hold] meloxicam  15 mg Oral Daily   [MAR Hold] mupirocin ointment  1 application. Nasal BID   [MAR Hold] polyethylene glycol  17 g Oral Daily   povidone-iodine  2 application. Topical Once   [MAR Hold] risperiDONE  1 mg Oral BID   [MAR Hold] traZODone  100 mg Oral QHS   Continuous Infusions:   ceFAZolin (ANCEF) IV     tranexamic acid     PRN Meds:.[MAR Hold] albuterol, [MAR Hold] bisacodyl, [MAR Hold]  morphine injection, [MAR Hold] ondansetron (ZOFRAN) IV, [MAR Hold] oxyCODONE, [MAR Hold] senna-docusate, [MAR Hold] sodium phosphate   I have personally reviewed following labs and imaging studies  LABORATORY DATA: CBC: Recent Labs  Lab 11/22/21 1347 11/26/21 0923 11/27/21 0226 11/28/21 0113  WBC 9.7 15.0* 18.6* 9.5  NEUTROABS  --  13.8*  --  6.1  HGB 11.8* 13.2 11.2* 11.0*  HCT 34.7* 38.1* 32.6* 32.7*  MCV 96.7 95.7 93.9 95.1  PLT 268 242 259 282     Basic Metabolic Panel: Recent Labs  Lab 11/22/21 1347 11/26/21 0923 11/27/21 0226 11/28/21 0113  NA 133* 130* 131* 131*  K 4.3 4.4 4.6 4.5  CL 102 95* 99 98  CO2 26 24 24 25   GLUCOSE 119* 185* 134* 103*  BUN 24* 19 27* 39*  CREATININE 1.25* 1.17 1.20 1.29*  CALCIUM 9.3 10.4* 9.9 9.5     GFR: Estimated Creatinine Clearance: 77.7 mL/min (A) (by C-G formula based on SCr of 1.29 mg/dL (H)).  Liver Function Tests: No results for input(s): AST, ALT, ALKPHOS, BILITOT, PROT, ALBUMIN in the last 168 hours. No results for input(s): LIPASE, AMYLASE in the last 168  hours. No results for input(s): AMMONIA in the last 168 hours.  Coagulation Profile: No results for input(s): INR, PROTIME in the last 168 hours.  Cardiac Enzymes: No results for input(s): CKTOTAL, CKMB, CKMBINDEX, TROPONINI in the last 168 hours.  BNP (last 3 results) No results for input(s): PROBNP in the last 8760 hours.  Lipid Profile: No results for input(s): CHOL, HDL, LDLCALC, TRIG, CHOLHDL, LDLDIRECT in the last 72 hours.  Thyroid Function Tests: No results for input(s): TSH, T4TOTAL, FREET4, T3FREE, THYROIDAB in the last 72 hours.  Anemia Panel: No results for input(s): VITAMINB12, FOLATE, FERRITIN, TIBC, IRON,  RETICCTPCT in the last 72 hours.  Urine analysis:    Component Value Date/Time   COLORURINE YELLOW 11/27/2021 1704   APPEARANCEUR HAZY (A) 11/27/2021 1704   LABSPEC 1.019 11/27/2021 1704   PHURINE 5.0 11/27/2021 1704   GLUCOSEU NEGATIVE 11/27/2021 1704   HGBUR MODERATE (A) 11/27/2021 1704   BILIRUBINUR NEGATIVE 11/27/2021 1704   KETONESUR NEGATIVE 11/27/2021 1704   PROTEINUR NEGATIVE 11/27/2021 1704   NITRITE NEGATIVE 11/27/2021 1704   LEUKOCYTESUR NEGATIVE 11/27/2021 1704    Sepsis Labs: Lactic Acid, Venous No results found for: LATICACIDVEN  MICROBIOLOGY: Recent Results (from the past 240 hour(s))  Culture, blood (Routine X 2) w Reflex to ID Panel     Status: None (Preliminary result)   Collection Time: 11/27/21 11:08 AM   Specimen: BLOOD  Result Value Ref Range Status   Specimen Description BLOOD BLOOD LEFT HAND  Final   Special Requests   Final    BOTTLES DRAWN AEROBIC AND ANAEROBIC Blood Culture adequate volume   Culture   Final    NO GROWTH < 24 HOURS Performed at [facility], [address] [place], [place] [address]    Report Status PENDING  Incomplete  Culture, blood (Routine X 2) w Reflex to ID Panel     Status: None (Preliminary result)   Collection Time: 11/27/21 11:15 AM   Specimen: BLOOD  Result Value Ref Range Status    Specimen Description BLOOD BLOOD RIGHT HAND  Final   Special Requests   Final    BOTTLES DRAWN AEROBIC AND ANAEROBIC Blood Culture adequate volume   Culture   Final    NO GROWTH < 24 HOURS Performed at [facility], [address] [place], [place] [address]    Report Status PENDING  Incomplete  Surgical PCR screen     Status: None   Collection Time: 11/28/21  3:48 AM   Specimen: Nasal Mucosa; Nasal Swab  Result Value Ref Range Status   MRSA, PCR NEGATIVE NEGATIVE Final   Staphylococcus aureus NEGATIVE NEGATIVE Final    Comment: (NOTE) The Xpert SA Assay (FDA approved for NASAL specimens in patients 59 years of age and older), is one component of a comprehensive surveillance program. It is not intended to diagnose infection nor to guide or monitor treatment. Performed at [facility], [address] [place], [place] [address]     RADIOLOGY STUDIES/RESULTS: DG Chest 1 View  Result Date: 11/27/2021 CLINICAL DATA:  Right hip pain EXAM: CHEST  1 VIEW COMPARISON:  04/12/2008 FINDINGS: Cardiac size is within normal limits. Thoracic aorta is tortuous and ectatic. Lung fields are clear of any infiltrates or pulmonary edema. There is no significant pleural effusion or pneumothorax. There are small linear densities in the right lower lung fields. IMPRESSION: There are no signs of pulmonary edema or focal pulmonary consolidation. Linear densities in the right lower lung fields may suggest scarring. Electronically Signed   By: Ernie AvenaPalani  Rathinasamy M.D.   On: 11/27/2021 12:17   CT HIP RIGHT WO CONTRAST  Result Date: 11/28/2021 CLINICAL DATA:  Severe right hip pain for the past 6 weeks. EXAM: CT OF THE RIGHT HIP WITHOUT CONTRAST TECHNIQUE: Multidetector CT imaging of the right hip was performed according to the standard protocol. Multiplanar CT image reconstructions were also generated. RADIATION DOSE REDUCTION: This exam was performed according to the departmental dose-optimization  program which includes automated exposure control, adjustment of the mA and/or kV according to patient size and/or use of iterative  reconstruction technique. COMPARISON:  Right hip x-rays from yesterday. MRI right hip dated Nov 26, 2021. FINDINGS: Bones/Joint/Cartilage Right femoral head subchondral fracture and prominent flattening again noted. Prominent subchondral sclerosis and cystic change in the right femoral head and acetabulum with superior migration of the right femur. Marginal osteophytes. No dislocation. Small complex joint effusion. Ligaments Ligaments are suboptimally evaluated by CT. Muscles and Tendons Grossly intact. Soft tissue No fluid collection or hematoma. No soft tissue mass. Small fat containing right inguinal hernia. IMPRESSION: 1. Similar findings consistent with advanced right femoral head avascular necrosis with secondary end-stage right hip osteoarthritis. Electronically Signed   By: Obie Dredge M.D.   On: 11/28/2021 08:29   DG HIP UNILAT WITH PELVIS 2-3 VIEWS RIGHT  Result Date: 11/27/2021 CLINICAL DATA:  Right hip pain. EXAM: DG HIP (WITH OR WITHOUT PELVIS) 2-3V RIGHT COMPARISON:  MRI 11/26/2021 FINDINGS: Chronic AVN with marked flattening of the right femoral head. Associated severe secondary a hip joint degenerative changes with severe joint space narrowing, bony eburnation, subchondral cystic change and probable fragmentation. Suspect remote posttraumatic changes involving the right ischium. The left hip is intact. The SI joints are intact. IMPRESSION: 1. 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 2. Severe right hip joint degenerative changes. Electronically Signed   By: Rudie Meyer M.D.   On: 11/27/2021 12:21     LOS: 1 day   Jeoffrey Massed, MD  [facility]    To contact the attending provider between 7A-7P or the covering provider during after hours 7P-7A, please log into the web site [email or web address] and access using universal [place]  password for that web site. If you do not have the password, please call the hospital operator.  11/28/2021, 1:43 PM

## 2021-11-28 NOTE — [place] FL2 (Signed)
[place] MEDICAID FL2 LEVEL OF CARE SCREENING TOOL     IDENTIFICATION  Patient Name: Sean [name] Birthdate: [birth year] Sex: male Admission Date (Current Location): 11/25/2021  [facility] and [place] Number:  Producer, television/film/video and Address:  [facility] [place], [address], [place], [place] [address]      Provider Number: [id number]  Attending Physician Name and Address:  Maretta Bees, MD  Relative Name and Phone Number:       Current Level of Care: Hospital Recommended Level of Care: Skilled Nursing Facility Prior Approval Number:    Date Approved/Denied:   PASRR Number: [id number]  Discharge Plan: SNF    Current Diagnoses: Patient Active Problem List   Diagnosis Date Noted   Avascular necrosis of bone of hip, right (HCC) 11/26/2021   Controlled type 2 diabetes mellitus without complication, without long-term current use of insulin (HCC) 11/26/2021   HTN (hypertension) 11/26/2021   Obesity, Class III, BMI 40-49.9 (morbid obesity) (HCC) 11/26/2021   Impaired ambulation 11/26/2021   PULMONARY EMBOLISM, HX OF 04/19/2008    Orientation RESPIRATION BLADDER Height & Weight     Self, Time, Situation, Place  Normal Incontinent, Indwelling catheter Weight: 280 lb (127 kg) Height:  5\' 6"  (167.6 cm)  BEHAVIORAL SYMPTOMS/MOOD NEUROLOGICAL BOWEL NUTRITION STATUS      Continent Diet (See dc summary)  AMBULATORY STATUS COMMUNICATION OF NEEDS Skin   Limited Assist Verbally Normal                       Personal Care Assistance Level of Assistance  Bathing, Feeding, Dressing Bathing Assistance: Limited assistance Feeding assistance: Independent Dressing Assistance: Limited assistance     Functional Limitations Info             SPECIAL CARE FACTORS FREQUENCY  PT (By licensed PT), OT (By licensed OT)     PT Frequency: 5x/week OT Frequency: 5x/week            Contractures Contractures Info: Not present    Additional Factors  Info  Code Status, Allergies, Psychotropic, Insulin Sliding Scale Code Status Info: Full Allergies Info: NKA Psychotropic Info: Trazodone Insulin Sliding Scale Info: See dc summary       Current Medications (11/28/2021):  This is the current hospital active medication list Current Facility-Administered Medications  Medication Dose Route Frequency Provider Last Rate Last Admin   [MAR Hold] acetaminophen (TYLENOL) tablet 1,000 mg  1,000 mg Oral Q8H Ghimire, Shanker M, MD   1,000 mg at 11/28/21 0559   [MAR Hold] albuterol (PROVENTIL) (2.5 MG/3ML) 0.083% nebulizer solution 2.5 mg  2.5 mg Nebulization Q6H PRN 01/28/22, MD       [MAR Hold] amLODipine (NORVASC) tablet 5 mg  5 mg Oral Daily Maretta Bees T, MD   5 mg at 11/28/21 0941   [MAR Hold] benztropine (COGENTIN) tablet 0.5 mg  0.5 mg Oral QHS 01/28/22, MD   0.5 mg at 11/27/21 2221   [MAR Hold] bisacodyl (DULCOLAX) EC tablet 5 mg  5 mg Oral Daily PRN 2222, MD   5 mg at 11/27/21 2221   [MAR Hold] FLUoxetine (PROZAC) capsule 40 mg  40 mg Oral q morning 2222, MD   40 mg at 11/28/21 0940   HYDROmorphone (DILAUDID) injection 0.25-0.5 mg  0.25-0.5 mg Intravenous Q5 min PRN 01/28/22, MD       insulin aspart (novoLOG) injection 0-14 Units  0-14 Units Subcutaneous  Q2H PRN Lucretia Kern, MD       [MAR Hold] insulin aspart (novoLOG) injection 0-9 Units  0-9 Units Subcutaneous TID WC Maretta Bees, MD   1 Units at 11/28/21 1218   lactated ringers infusion   Intravenous Continuous Lucretia Kern, MD 10 mL/hr at 11/28/21 1501 Restarted at 11/28/21 1642   [MAR Hold] lisinopril (ZESTRIL) tablet 20 mg  20 mg Oral Daily Terald Sleeper, MD   20 mg at 11/28/21 0941   [MAR Hold] meloxicam (MOBIC) tablet 15 mg  15 mg Oral Daily Terald Sleeper, MD   15 mg at 11/28/21 0942   [MAR Hold] morphine (PF) 2 MG/ML injection 1 mg  1 mg Intravenous Q4H PRN Ghimire, Werner Lean, MD       [MAR Hold] mupirocin ointment  (BACTROBAN) 2 % 1 application.  1 application. Nasal BID Maretta Bees, MD   1 application. at 11/28/21 0944   [MAR Hold] ondansetron (ZOFRAN) injection 4 mg  4 mg Intravenous Q6H PRN Maretta Bees, MD       ondansetron [facility]) injection 4 mg  4 mg Intravenous Once PRN Mal Amabile, MD       [facility] Hold] oxyCODONE (Oxy IR/ROXICODONE) immediate release tablet 5 mg  5 mg Oral Q6H PRN Mikey College T, MD   5 mg at 11/28/21 3734   oxyCODONE (Oxy IR/ROXICODONE) immediate release tablet 5 mg  5 mg Oral Once PRN Mal Amabile, MD       Or   oxyCODONE (ROXICODONE) 5 MG/5ML solution 5 mg  5 mg Oral Once PRN Mal Amabile, MD       [facility] Hold] polyethylene glycol (MIRALAX / GLYCOLAX) packet 17 g  17 g Oral Daily Maretta Bees, MD   17 g at 11/27/21 1305   povidone-iodine 10 % swab 2 application.  2 application. Topical Once Freeman Caldron, PA-C       [facility] Hold] risperiDONE (RISPERDAL) tablet 1 mg  1 mg Oral BID Terald Sleeper, MD   1 mg at 11/28/21 0940   [MAR Hold] senna-docusate (Senokot-S) tablet 1 tablet  1 tablet Oral QHS PRN Maretta Bees, MD       [MAR Hold] sodium phosphate (FLEET) 7-19 GM/118ML enema 1 enema  1 enema Rectal Daily PRN Maretta Bees, MD       [MAR Hold] traZODone (DESYREL) tablet 100 mg  100 mg Oral QHS Terald Sleeper, MD   100 mg at 11/27/21 2211     Discharge Medications: Please see discharge summary for a list of discharge medications.  Relevant Imaging Results:  Relevant Lab Results:   Additional Information SSN: [id number]. Pfizer vaccines 10/10/19, 10/31/19.  Mearl Latin, LCSW

## 2021-11-28 NOTE — Anesthesia Procedure Notes (Signed)
Spinal  Patient location during procedure: OR Start time: 11/28/2021 3:08 PM End time: 11/28/2021 3:12 PM Reason for block: surgical anesthesia Staffing Performed: anesthesiologist  Anesthesiologist: Mal Amabile, MD Preanesthetic Checklist Completed: patient identified, IV checked, site marked, risks and benefits discussed, surgical consent, monitors and equipment checked, pre-op evaluation and timeout performed Spinal Block Patient position: sitting Prep: DuraPrep and site prepped and draped Patient monitoring: heart rate, cardiac monitor, continuous pulse ox and blood pressure Approach: midline Location: L3-4 Injection technique: single-shot Needle Needle type: Pencan  Needle gauge: 24 G Needle length: 9 cm Needle insertion depth: 7 cm Assessment Sensory level: T4 Events: CSF return Additional Notes Patient tolerated procedure well. Adequate sensory level.

## 2021-11-28 NOTE — Op Note (Signed)
OPERATIVE REPORT  SURGEON: Rod Can, MD   ASSISTANT: Larene Pickett, PA-C.  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   POSTOPERATIVE DIAGNOSIS: Same.  PROCEDURE: Right total hip arthroplasty, anterior approach.   IMPLANTS: Biomet Taperloc Complete Microplasty stem, size 18 x 121 mm, standard offset. Biomet G7 OsseoTi multi hole Cup, size 58 mm. Biomet Vivacit-E liner, size 36 mm, G, +5 neutral. Biomet Biolox ceramic head ball, size 36 + 3 mm. 6.5 x 35 mm cancellous bone screw x1.  ANESTHESIA:  MAC and Spinal  ESTIMATED BLOOD LOSS:-600 mL    ANTIBIOTICS: 3 g Ancef.  DRAINS: None.  COMPLICATIONS: None.   CONDITION: PACU - hemodynamically stable.   BRIEF CLINICAL NOTE: Sean [name] is a 61 y.o. diabetic male with a long-standing history of Right hip avascular necrosis.  Over the past several months, his pain is gotten significantly worse.  In fact, over the past 2 months, he has been unable to weight-bear on the right lower extremity.  He presented to the emergency department last week with intractable right hip pain.  MRI and CT scan showed avascular necrosis of the right hip with femoral head collapse and superior acetabular bone loss.  After failing conservative management, the patient was indicated for total hip arthroplasty. The risks, benefits, and alternatives to the procedure were explained, and the patient elected to proceed.  PROCEDURE IN DETAIL: Surgical site was marked by myself in the pre-op holding area. Once inside the operating room, spinal anesthesia was obtained, and a foley catheter was inserted. The patient was then positioned on the Hana table.  All bony prominences were well padded.  The hip was prepped and draped in the normal sterile surgical fashion.  A time-out was called verifying side and site of surgery. The patient received IV antibiotics within 60 minutes of beginning the procedure.    Bikini incision was made, and superficial dissection was performed lateral to the ASIS. The direct anterior approach to the hip was performed through the Hueter interval.  Lateral femoral circumflex vessels were treated with the Auqumantys. The anterior capsule was exposed and an inverted T capsulotomy was made. The femoral neck cut was made to the level of the templated cut.  A corkscrew was placed into the head and the head was removed.  The femoral head was found to be collapse greater than 50% and have eburnated bone. The head was passed to the back table and was measured. Pubofemoral ligament was released off of the calcar, taking care to stay on bone. Superior capsule was released from the greater trochanter, taking care to stay lateral to the posterior border of the femoral neck in order to preserve the short external rotators.   Acetabular exposure was achieved, and the pulvinar and labrum were excised. Sequential reaming of the acetabulum was then performed up to a size 57 mm reamer, taking care to keep the hip center as low and lateral as possible.  There were 2 large subchondral bone cysts in the acetabular bed.  I curetted both cyst back to a stable, bony base.  Autologous cancellous bone from the reamings was used to pack the cyst.  The bone graft was impacted using a 56 mm reamer on reverse.  A 58 mm cup was then opened and impacted into place at approximately 40 degrees of abduction and 20 degrees of anteversion.  I elected to augment our already acceptable press-fit fixation with a single 6.5 mm cancellous bone  screw.  The final polyethylene liner was impacted into place and acetabular osteophytes were removed.    I then gained femoral exposure taking care to protect the abductors and greater trochanter.  This was performed using standard external rotation, extension, and adduction.  A cookie cutter was used to enter the femoral canal, and then the femoral canal finder was placed.  Sequential  broaching was performed up to a size 18.  Calcar planer was used on the femoral neck remnant.  I placed a standard offset neck and a trial head ball.  The hip was reduced.  Leg lengths and offset were checked fluoroscopically.  The hip was dislocated and trial components were removed.  The final implants were placed, and the hip was reduced.  Fluoroscopy was used to confirm component position and leg lengths.  At 90 degrees of external rotation and full extension, the hip was stable to an anterior directed force.   The wound was copiously irrigated with Irrisept solution and normal saline using pule lavage.  Marcaine solution was injected into the periarticular soft tissue.  Through a separate stab incision over the lateral thigh, a 10 mm flat JP drain was placed into the subcutaneous tissue and sewn in with 3-0 nylon suture.  The wound was closed in layers using #1 Vicryl and V-Loc for the fascia, 2-0 Vicryl for the subcutaneous fat, 2-0 Monocryl for the deep dermal layer, and staples plus Dermabond for the skin.  Once the glue was fully dried, an Aquacell Ag dressing was applied.  The patient was transported to the recovery room in stable condition.  Sponge, needle, and instrument counts were correct at the end of the case x2.  The patient tolerated the procedure well and there were no known complications.  Please note that a surgical assistant was a medical necessity for this procedure to perform it in a safe and expeditious manner. Assistant was necessary to provide appropriate retraction of vital neurovascular structures, to prevent femoral fracture, and to allow for anatomic placement of the prosthesis.  POSTOPERATIVE PLAN: Patient be readmitted to the hospitalist service.  He may weight-bear as tolerated right lower extremity with a walker.  Mobilize out of bed with physical therapy.  Routine prophylactic IV antibiotics.  Begin aspirin 81 mg p.o. twice daily for DVT prophylaxis.  Patient will undergo  disposition planning.  He will follow-up in the office in 2 weeks for routine postoperative.

## 2021-11-28 NOTE — Transfer of Care (Addendum)
Immediate Anesthesia Transfer of Care Note  Patient: Sean [name]  Procedure(s) Performed: ANTERIOR APPROACH TOTAL HIP (Right: Hip)  Patient Location: PACU  Anesthesia Type:MAC and Spinal  Level of Consciousness: awake, alert  and oriented  Airway & Oxygen Therapy: Patient Spontanous Breathing  Post-op Assessment: Report given to RN and Post -op Vital signs reviewed and stable  Post vital signs: Reviewed and stable  Last Vitals:  Vitals Value Taken Time  BP 107/68 11/28/21 1737  Temp    Pulse 93 11/28/21 1739  Resp 18 11/28/21 1739  SpO2 96 % 11/28/21 1739  Vitals shown include unvalidated device data.  Last Pain:  Vitals:   11/28/21 1412  TempSrc:   PainSc: 0-No pain      Patients Stated Pain Goal: 0 (21/19/41 7408)  Complications: No notable events documented.

## 2021-11-29 DIAGNOSIS — R262 Difficulty in walking, not elsewhere classified: Secondary | ICD-10-CM | POA: Diagnosis not present

## 2021-11-29 DIAGNOSIS — I159 Secondary hypertension, unspecified: Secondary | ICD-10-CM | POA: Diagnosis not present

## 2021-11-29 DIAGNOSIS — M8700 Idiopathic aseptic necrosis of unspecified bone: Secondary | ICD-10-CM | POA: Diagnosis not present

## 2021-11-29 DIAGNOSIS — E119 Type 2 diabetes mellitus without complications: Secondary | ICD-10-CM | POA: Diagnosis not present

## 2021-11-29 LAB — BASIC METABOLIC PANEL
Anion gap: 7 (ref 5–15)
BUN: 40 mg/dL — ABNORMAL HIGH (ref 6–20)
CO2: 23 mmol/L (ref 22–32)
Calcium: 8.9 mg/dL (ref 8.9–10.3)
Chloride: 99 mmol/L (ref 98–111)
Creatinine, Ser: 1.39 mg/dL — ABNORMAL HIGH (ref 0.61–1.24)
GFR, Estimated: 58 mL/min — ABNORMAL LOW (ref >60)
Glucose, Bld: 175 mg/dL — ABNORMAL HIGH (ref 70–99)
Potassium: 4.6 mmol/L (ref 3.5–5.1)
Sodium: 129 mmol/L — ABNORMAL LOW (ref 135–145)

## 2021-11-29 LAB — URIC ACID: Uric Acid, Serum: 7 mg/dL (ref 3.7–8.6)

## 2021-11-29 LAB — GLUCOSE, CAPILLARY
Glucose-Capillary: 154 mg/dL — ABNORMAL HIGH (ref 70–99)
Glucose-Capillary: 206 mg/dL — ABNORMAL HIGH (ref 70–99)
Glucose-Capillary: 233 mg/dL — ABNORMAL HIGH (ref 70–99)
Glucose-Capillary: 254 mg/dL — ABNORMAL HIGH (ref 70–99)

## 2021-11-29 LAB — CBC
HCT: 29.1 % — ABNORMAL LOW (ref 39.0–52.0)
Hemoglobin: 9.8 g/dL — ABNORMAL LOW (ref 13.0–17.0)
MCH: 31.9 pg (ref 26.0–34.0)
MCHC: 33.7 g/dL (ref 30.0–36.0)
MCV: 94.8 fL (ref 80.0–100.0)
Platelets: 260 10*3/uL (ref 150–400)
RBC: 3.07 MIL/uL — ABNORMAL LOW (ref 4.22–5.81)
RDW: 12.7 % (ref 11.5–15.5)
WBC: 11.4 10*3/uL — ABNORMAL HIGH (ref 4.0–10.5)
nRBC: 0 % (ref 0.0–0.2)

## 2021-11-29 LAB — OSMOLALITY: Osmolality: 289 mOsm/kg (ref 275–295)

## 2021-11-29 MED ORDER — PANTOPRAZOLE SODIUM 40 MG PO TBEC
40.0000 mg | DELAYED_RELEASE_TABLET | Freq: Every day | ORAL | Status: DC
  Administered 2021-11-29 – 2021-12-04 (×6): 40 mg via ORAL
  Filled 2021-11-29 (×6): qty 1

## 2021-11-29 MED ORDER — SODIUM CHLORIDE 0.9 % IV SOLN
INTRAVENOUS | Status: DC
Start: 2021-11-29 — End: 2021-11-30

## 2021-11-29 NOTE — Anesthesia Postprocedure Evaluation (Signed)
Anesthesia Post Note  Patient: CRISTOBAL ADVANI  Procedure(s) Performed: ANTERIOR APPROACH TOTAL HIP (Right: Hip)     Patient location during evaluation: Other Anesthesia Type: Spinal Level of consciousness: oriented and awake and alert Pain management: pain level controlled Vital Signs Assessment: post-procedure vital signs reviewed and stable Respiratory status: spontaneous breathing and respiratory function stable Cardiovascular status: blood pressure returned to baseline and stable Postop Assessment: no headache, no backache, no apparent nausea or vomiting, spinal receding and patient able to bend at knees Anesthetic complications: no   No notable events documented.  Last Vitals:  Vitals:   11/29/21 0400 11/29/21 0800  BP: 124/70 123/71  Pulse: 90 95  Resp: 15 18  Temp: 36.7 C 36.7 C  SpO2:  92%    Last Pain:  Vitals:   11/29/21 0800  TempSrc: Oral  PainSc:                  Lionell Matuszak,W. EDMOND

## 2021-11-29 NOTE — Progress Notes (Signed)
Subjective: 1 Day Post-Op Procedure(s) (LRB): ANTERIOR APPROACH TOTAL HIP (Right) Patient seen in rounds for Dr. Linna Caprice today Patient reports pain as moderate.  Reports most of the pain is located in surgical site Overall doing better, he states that the pain that he was experiencing prior to surgery is now gone Denies any issues overnight Currently has a Foley in  Objective: Vital signs in last 24 hours: Temp:  [97.4 F (36.3 C)-98.4 F (36.9 C)] 98.1 F (36.7 C) (06/03 0800) Pulse Rate:  [73-98] 95 (06/03 0800) Resp:  [12-18] 18 (06/03 0800) BP: (103-137)/(60-83) 123/71 (06/03 0800) SpO2:  [92 %-97 %] 92 % (06/03 0800)  Intake/Output from previous day: 06/02 0701 - 06/03 0700 In: 1900 [I.V.:1200; IV Piggyback:700] Out: 1765 [Urine:1150; Drains:15; Blood:600] Intake/Output this shift: No intake/output data recorded.  Recent Labs    11/26/21 0923 11/27/21 0226 11/28/21 0113 11/29/21 0130  HGB 13.2 11.2* 11.0* 9.8*   Recent Labs    11/28/21 0113 11/29/21 0130  WBC 9.5 11.4*  RBC 3.44* 3.07*  HCT 32.7* 29.1*  PLT 282 260   Recent Labs    11/28/21 0113 11/29/21 0130  NA 131* 129*  K 4.5 4.6  CL 98 99  CO2 25 23  BUN 39* 40*  CREATININE 1.29* 1.39*  GLUCOSE 103* 175*  CALCIUM 9.5 8.9   No results for input(s): LABPT, INR in the last 72 hours.  Neurologically intact Neurovascular intact Sensation intact distally Intact pulses distally Dorsiflexion/Plantar flexion intact Incision: dressing C/D/I Compartment soft   Assessment/Plan: 1 Day Post-Op Procedure(s) (LRB): ANTERIOR APPROACH TOTAL HIP (Right) Advance diet Up with therapy, weightbearing as tolerated on right lower extremity with a walker Aspirin 81 mg twice daily for DVT prevention Most likely will require SNF once discharged due to homelessness On Hospitalist service     Denman George [facility] [phone] 11/29/2021, 9:07 AM

## 2021-11-29 NOTE — Evaluation (Signed)
Occupational Therapy Evaluation Patient Details Name: Sean NianRobert F Mcgough MRN: [id number] DOB: 01/08/1961 Today's Date: 11/29/2021   History of Present Illness 61 yo admited 5/30 with hip pain and impaired gait. 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 Now s/p R THA on 6/2. PMHx: OA, HTN, DM, obesity, anxiety/depression   Clinical Impression   Pt reports he is independent with ADLs, although struggles with LB ADLs, uses cane for mobility. Pt currently requiring set up -max A for ADLs, mod A +2 for bed mobility, and min A +2 for sit to stand transfer and to take side steps at EOB. Pt with increased time needed for bed mobility and transfers due to pain. Pt attempting to scoot forward to offload R hip, requiring mod v/cing for positioning/safety. Pt presenting with impairments listed below, will follow acutely. Recommend SNF at d/c.     Recommendations for follow up therapy are one component of a multi-disciplinary discharge planning process, led by the attending physician.  Recommendations may be updated based on patient status, additional functional criteria and insurance authorization.   Follow Up Recommendations  Skilled nursing-short term rehab (<3 hours/day)    Assistance Recommended at Discharge Intermittent Supervision/Assistance  Patient can return home with the following A little help with walking and/or transfers;A lot of help with bathing/dressing/bathroom;Assist for transportation;Help with stairs or ramp for entrance    Functional Status Assessment  Patient has had a recent decline in their functional status and demonstrates the ability to make significant improvements in function in a reasonable and predictable amount of time.  Equipment Recommendations  None recommended by OT;Other (comment) (defer to next venue of care)    Recommendations for Other Services PT consult     Precautions / Restrictions Precautions Precautions:  Fall Restrictions Weight Bearing Restrictions: Yes RLE Weight Bearing: Weight bearing as tolerated      Mobility Bed Mobility Overal bed mobility: Needs Assistance Bed Mobility: Supine to Sit, Sit to Supine     Supine to sit: Mod assist, +2 for physical assistance Sit to supine: Mod assist, +2 for physical assistance   General bed mobility comments: for trunk elevation and bringing BLE to bed    Transfers Overall transfer level: Needs assistance Equipment used: Rolling walker (2 wheels) Transfers: Sit to/from Stand Sit to Stand: Min assist, +2 safety/equipment           General transfer comment: from elevated bed height      Balance Overall balance assessment: Needs assistance Sitting-balance support: Bilateral upper extremity supported, Feet supported Sitting balance-Leahy Scale: Poor Sitting balance - Comments: scoots forward very far to EOB, requires bilateral hands on bed for stability   Standing balance support: Bilateral upper extremity supported Standing balance-Leahy Scale: Poor Standing balance comment: reliant on RW support                           ADL either performed or assessed with clinical judgement   ADL Overall ADL's : Needs assistance/impaired Eating/Feeding: Independent   Grooming: Set up;Sitting   Upper Body Bathing: Set up;Sitting   Lower Body Bathing: Maximal assistance;Sit to/from stand   Upper Body Dressing : Set up;Sitting   Lower Body Dressing: Maximal assistance;Sit to/from stand   Toilet Transfer: Minimal assistance;+2 for safety/equipment;+2 for physical assistance Toilet Transfer Details (indicate cue type and reason): simulated at EOB side stepping Toileting- Clothing Manipulation and Hygiene: Moderate assistance;Sitting/lateral lean;Sit to/from stand  Functional mobility during ADLs: Minimal assistance;+2 for safety/equipment;+2 for physical assistance       Vision   Vision Assessment?: No apparent  visual deficits     Perception     Praxis      Pertinent Vitals/Pain Pain Assessment Pain Assessment: Faces Pain Score: 5  Faces Pain Scale: Hurts even more Pain Location: R hip Pain Descriptors / Indicators: Aching, Guarding Pain Intervention(s): Limited activity within patient's tolerance, Monitored during session, Repositioned     Hand Dominance Right   Extremity/Trunk Assessment Upper Extremity Assessment Upper Extremity Assessment: Generalized weakness RUE Deficits / Details: longstanding shoulder limitations, pt reports arthritis   Lower Extremity Assessment Lower Extremity Assessment: Defer to PT evaluation   Cervical / Trunk Assessment Cervical / Trunk Assessment: Other exceptions Cervical / Trunk Exceptions: body habitus   Communication Communication Communication: No difficulties   Cognition Arousal/Alertness: Awake/alert Behavior During Therapy: Anxious, Restless, Impulsive Overall Cognitive Status: Within Functional Limits for tasks assessed                                       General Comments  VSS on RA    Exercises     Shoulder Instructions      Home Living Family/patient expects to be discharged to:: Shelter/Homeless                                 Additional Comments: has been living in his car, stayed with a friend for awhile, plans to hopefully find a boarding house      Prior Functioning/Environment Prior Level of Function : Independent/Modified Independent             Mobility Comments: walks with cane ADLs Comments: reports struggling with LB ADLs due to pain and obesity        OT Problem List: Decreased strength;Impaired balance (sitting and/or standing);Decreased knowledge of use of DME or AE;Obesity;Pain;Decreased range of motion;Decreased activity tolerance      OT Treatment/Interventions: Self-care/ADL training;DME and/or AE instruction;Patient/family education;Balance training;Therapeutic  activities    OT Goals(Current goals can be found in the care plan section) Acute Rehab OT Goals Patient Stated Goal: to decrease pain OT Goal Formulation: With patient Time For Goal Achievement: 12/13/21 Potential to Achieve Goals: Good ADL Goals Pt Will Perform Lower Body Dressing: with min assist;sitting/lateral leans;sit to/from stand;with adaptive equipment Pt Will Transfer to Toilet: with min guard assist;ambulating;regular height toilet;bedside commode  OT Frequency: Min 2X/week    Co-evaluation              AM-PAC OT "6 Clicks" Daily Activity     Outcome Measure Help from another person eating meals?: None Help from another person taking care of personal grooming?: A Little Help from another person toileting, which includes using toliet, bedpan, or urinal?: A Lot Help from another person bathing (including washing, rinsing, drying)?: A Lot Help from another person to put on and taking off regular upper body clothing?: A Little Help from another person to put on and taking off regular lower body clothing?: A Lot 6 Click Score: 16   End of Session Equipment Utilized During Treatment: Gait belt;Rolling walker (2 wheels) Nurse Communication: Mobility status  Activity Tolerance: Patient tolerated treatment well Patient left: in bed;with call bell/phone within reach;with bed alarm set  OT Visit Diagnosis: Unsteadiness on feet (R26.81);Other abnormalities of  gait and mobility (R26.89);Muscle weakness (generalized) (M62.81)                Time: 1333-1410 OT Time Calculation (min): 37 min Charges:  OT General Charges $OT Visit: 1 Visit OT Evaluation $OT Eval Moderate Complexity: 1 Mod OT Treatments $Therapeutic Activity: 8-22 mins  Alfonzo Beers, OTD, OTR/L Acute Rehab [phone]) [phone]   Mayer Masker 11/29/2021, 3:46 PM

## 2021-11-29 NOTE — Evaluation (Signed)
Physical Therapy Re-Evaluation Patient Details Name: Sean [name] MRN: [id number] DOB: Jul 26, 1960 Today's Date: 11/29/2021  History of Present Illness  61 yo admited 5/30 with hip pain and impaired gait. 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 Now s/p R THA on 6/2. PMHx: OA, HTN, DM, obesity, anxiety/depression  Clinical Impression  Patient now s/p R THA and complaining of severe pain limiting his mobility at this time. Patient required overall totalA to sit EOB this date as patient was resisting therapist's help and not following commands. Patient continued to disregard therapist's instructions and began scooting towards EOB. Required +2 to safely return patient back to bed as he was not safe to stand due to pain and not following commands. Continue to recommend SNF for ongoing Physical Therapy.         Recommendations for follow up therapy are one component of a multi-disciplinary discharge planning process, led by the attending physician.  Recommendations may be updated based on patient status, additional functional criteria and insurance authorization.  Follow Up Recommendations Skilled nursing-short term rehab (<3 hours/day)    Assistance Recommended at Discharge Frequent or constant Supervision/Assistance  Patient can return home with the following  A lot of help with walking and/or transfers;A lot of help with bathing/dressing/bathroom;Assist for transportation;Assistance with cooking/housework    Equipment Recommendations Rolling Raivyn Kabler (2 wheels);BSC/3in1  Recommendations for Other Services       Functional Status Assessment       Precautions / Restrictions Precautions Precautions: Fall Restrictions Weight Bearing Restrictions: Yes RLE Weight Bearing: Weight bearing as tolerated      Mobility  Bed Mobility Overal bed mobility: Needs Assistance Bed Mobility: Supine to Sit, Sit to Supine     Supine to sit: Total  assist Sit to supine: Total assist, +2 for physical assistance, +2 for safety/equipment   General bed mobility comments: overall totalA to achieve EOB as patient not assisting but stating he was. Therapist felt resistance each attempt to assist. Required extensive time to complete due to pain and not following commands. Patient scooting forward towards EOB and not following cues to stop by therapist. Pressed call button for assistance. RN and NT present to assist back to bed as patient unable to stand at this time due to pain and inability to safely follow command    Transfers                        Ambulation/Gait                  Stairs            Wheelchair Mobility    Modified Rankin (Stroke Patients Only)       Balance Overall balance assessment: Needs assistance Sitting-balance support: Bilateral upper extremity supported, Feet supported Sitting balance-Leahy Scale: Poor                                       Pertinent Vitals/Pain Pain Assessment Pain Assessment: Faces Faces Pain Scale: Hurts whole lot Pain Location: R hip Pain Descriptors / Indicators: Aching, Guarding Pain Intervention(s): Monitored during session, Repositioned, Patient requesting pain meds-RN notified, Limited activity within patient's tolerance    Home Living  Prior Function                       Hand Dominance        Extremity/Trunk Assessment                Communication      Cognition Arousal/Alertness: Awake/alert Behavior During Therapy: Anxious, Restless, Impulsive Overall Cognitive Status: Within Functional Limits for tasks assessed                                 General Comments: Overall WFL but patient not listening to instructions while in pain. Patient scooting forward on EOB towards floor. Therapist instructing patient to stop scooting with patient not following commands.  Called for help by nurse and nurse tech        General Comments      Exercises     Assessment/Plan    PT Assessment    PT Problem List         PT Treatment Interventions      PT Goals (Current goals can be found in the Care Plan section)  Acute Rehab PT Goals Patient Stated Goal: be able to walk PT Goal Formulation: With patient Time For Goal Achievement: 12/11/21 Potential to Achieve Goals: Fair    Frequency Min 3X/week     Co-evaluation               AM-PAC PT "6 Clicks" Mobility  Outcome Measure Help needed turning from your back to your side while in a flat bed without using bedrails?: Total Help needed moving from lying on your back to sitting on the side of a flat bed without using bedrails?: Total Help needed moving to and from a bed to a chair (including a wheelchair)?: Total Help needed standing up from a chair using your arms (e.g., wheelchair or bedside chair)?: Total Help needed to walk in hospital room?: Total Help needed climbing 3-5 steps with a railing? : Total 6 Click Score: 6    End of Session   Activity Tolerance: Patient limited by pain Patient left: in bed;with call bell/phone within reach;with bed alarm set Nurse Communication: Mobility status PT Visit Diagnosis: Other abnormalities of gait and mobility (R26.89);Difficulty in walking, not elsewhere classified (R26.2);Muscle weakness (generalized) (M62.81)    Time: [id number] PT Time Calculation (min) (ACUTE ONLY): 31 min   Charges:   PT Evaluation $PT Re-evaluation: 1 Re-eval PT Treatments $Therapeutic Activity: 8-22 mins        Carmisha Larusso A. Gilford Rile PT, DPT Acute Rehabilitation Services Pager [phone] Office [phone]   Linna Hoff 11/29/2021, 12:38 PM

## 2021-11-29 NOTE — Progress Notes (Signed)
See above                        PROGRESS NOTE        PATIENT DETAILS Name: Sean [name] Age: 61 y.o. Sex: male Date of Birth: 1961/04/24 Admit Date: 11/25/2021 Admitting Physician Marrion Coy, MD GEX:BMWUXL, [facility]  Brief Summary: Patient is a 61 y.o.  male with history of chronic right>left hip pain-presented with intractable right hip pain (not relieved by oral narcotics)-with significant leukocytosis unable to ambulate-and subsequently admitted to the hospitalist service.   Significant events: 5/30>> presented to ED with intractable right hip pain-unable to ambulate.  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 6/02>> right hip arthroplasty.  Significant studies: 5/30>> x-ray right knee: No acute radiographic abnormality of the right knee. 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  Small complex right hip joint effusion-likely reactive. 5/31>> MRI lumbosacral spine: Bulky L5-S1 disc osteophyte 6/01>> CXR: No PNA  Significant microbiology data: 6/1>> blood cultures: No growth  Procedures: 6/2>>Right total hip arthroplasty, anterior approach (Dr. Linna Caprice)  Consults: Orthopedics  Subjective: Lying comfortably in bed-denies any chest pain or shortness of breath.  Objective: Vitals: Blood pressure 123/71, pulse 95, temperature 98.1 F (36.7 C), temperature source Oral, resp. rate 18, height 5\' 6"  (1.676 m), weight 127 kg, SpO2 92 %.   Exam: Gen Exam:Alert awake-not in any distress HEENT:atraumatic, normocephalic Chest: B/L clear to auscultation anteriorly CVS:S1S2 regular Abdomen:soft non tender, non distended Extremities:no edema Neurology: Non focal Skin: no rash   Pertinent Labs/Radiology:    Latest Ref Rng & Units 11/29/2021    1:30 AM 11/28/2021    1:13 AM 11/27/2021    2:26 AM  CBC  WBC 4.0 - 10.5 K/uL 11.4    9.5   18.6    Hemoglobin 13.0 - 17.0 g/dL 9.8   01/27/2022   24.4    Hematocrit 39.0 - 52.0 % 29.1   32.7   32.6    Platelets 150 - 400 K/uL 260   282   259      Lab Results  Component Value Date   NA 129 (L) 11/29/2021   K 4.6 11/29/2021   CL 99 11/29/2021   CO2 23 11/29/2021       Assessment/Plan: Intractable right hip pain due to avascular necrosis with femoral head collapse: S/p total hip arthroplasty on 6/2-orthopedics following-recommendations weightbearing as tolerated on RLE with walker, ASA 81 mg twice daily for VTE prophylaxis.  Await PT/OT-but suspect will require SNF on discharge.  Continue supportive care.   Leukocytosis: No clear foci of infection apparent-CXR/blood cultures/UA all negative for infection.  Leukocytosis has resolved-sepsis/SIRS physiology ruled out.  Hyponatremia: Further drop in sodium today-has mild AKI as well-we will hydrate with IVF and recheck electrolytes tomorrow.    AKI: Mild-probably hemodynamically mediated 600 cc operative blood loss-gently hydrate today and recheck electrolytes tomorrow.  Hold lisinopril for now.  Acute perioperative blood loss anemia: 600 cc blood loss per op note-no indication for transfusion-watch closely.  HTN: Stable-continue amlodipine-holding lisinopril due to mild AKI.  Resume when able.  DM-2 (A1c 6.7 on 6/2): Continue SSI-resume metformin on discharge.    Recent Labs    11/28/21 1737 11/28/21 2044 11/29/21 0803  GLUCAP 150* 126* 154*      Anxiety/depression: Stable-continue trazodone/risperidone/Zoloft.  Homeless: Living in a car prior to  this hospitalization-suspect will require SNF on discharge.  Morbid Obesity: Estimated body mass index is 45.19 kg/m as calculated from the following:   Height as of this encounter: 5\' 6"  (1.676 m).   Weight as of this encounter: 127 kg.   Code status:   Code Status: Full Code   DVT Prophylaxis: SCDs Start: 11/28/21 1824   Family Communication: None at  bedside   Disposition Plan: Status is: Observation The patient will require care spanning > 2 midnights and should be moved to inpatient because: S/p right hip replacement on 6/12-SNF early next week.  Planned Discharge Destination:Skilled nursing facility   Diet: Diet Order             Diet Carb Modified Fluid consistency: Thin; Room service appropriate? Yes  Diet effective now                     Antimicrobial agents: Anti-infectives (From admission, onward)    Start     Dose/Rate Route Frequency Ordered Stop   11/28/21 2200  ceFAZolin (ANCEF) IVPB 2g/100 mL premix        2 g 200 mL/hr over 30 Minutes Intravenous Every 6 hours 11/28/21 1823 11/29/21 0452   11/28/21 0800  ceFAZolin (ANCEF) IVPB 3g/100 mL premix        3 g 200 mL/hr over 30 Minutes Intravenous On call to O.R. 11/27/21 2213 11/28/21 1556        MEDICATIONS: Scheduled Meds:  amLODipine  5 mg Oral Daily   aspirin  81 mg Oral BID   benztropine  0.5 mg Oral QHS   Chlorhexidine Gluconate Cloth  6 each Topical Daily   docusate sodium  100 mg Oral BID   FLUoxetine  40 mg Oral q morning   insulin aspart  0-9 Units Subcutaneous TID WC   lisinopril  20 mg Oral Daily   meloxicam  15 mg Oral Daily   mupirocin ointment  1 application. Nasal BID   pantoprazole  40 mg Oral Q1200   polyethylene glycol  17 g Oral Daily   risperiDONE  1 mg Oral BID   senna  1 tablet Oral BID   traZODone  100 mg Oral QHS   Continuous Infusions:  sodium chloride Stopped (11/28/21 2000)   methocarbamol (ROBAXIN) IV     PRN Meds:.acetaminophen, albuterol, alum & mag hydroxide-simeth, bisacodyl, diphenhydrAMINE, HYDROcodone-acetaminophen, HYDROcodone-acetaminophen, menthol-cetylpyridinium **OR** phenol, methocarbamol **OR** methocarbamol (ROBAXIN) IV, metoCLOPramide **OR** metoCLOPramide (REGLAN) injection, morphine injection, ondansetron **OR** ondansetron (ZOFRAN) IV, polyethylene glycol   I have personally reviewed  following labs and imaging studies  LABORATORY DATA: CBC: Recent Labs  Lab 11/22/21 1347 11/26/21 0923 11/27/21 0226 11/28/21 0113 11/29/21 0130  WBC 9.7 15.0* 18.6* 9.5 11.4*  NEUTROABS  --  13.8*  --  6.1  --   HGB 11.8* 13.2 11.2* 11.0* 9.8*  HCT 34.7* 38.1* 32.6* 32.7* 29.1*  MCV 96.7 95.7 93.9 95.1 94.8  PLT 268 242 259 282 260     Basic Metabolic Panel: Recent Labs  Lab 11/22/21 1347 11/26/21 0923 11/27/21 0226 11/28/21 0113 11/29/21 0130  NA 133* 130* 131* 131* 129*  K 4.3 4.4 4.6 4.5 4.6  CL 102 95* 99 98 99  CO2 26 24 24 25 23   GLUCOSE 119* 185* 134* 103* 175*  BUN 24* 19 27* 39* 40*  CREATININE 1.25* 1.17 1.20 1.29* 1.39*  CALCIUM 9.3 10.4* 9.9 9.5 8.9     GFR: Estimated Creatinine Clearance: 72.1 mL/min (A) (by C-G  formula based on SCr of 1.39 mg/dL (H)).  Liver Function Tests: No results for input(s): AST, ALT, ALKPHOS, BILITOT, PROT, ALBUMIN in the last 168 hours. No results for input(s): LIPASE, AMYLASE in the last 168 hours. No results for input(s): AMMONIA in the last 168 hours.  Coagulation Profile: No results for input(s): INR, PROTIME in the last 168 hours.  Cardiac Enzymes: No results for input(s): CKTOTAL, CKMB, CKMBINDEX, TROPONINI in the last 168 hours.  BNP (last 3 results) No results for input(s): PROBNP in the last 8760 hours.  Lipid Profile: No results for input(s): CHOL, HDL, LDLCALC, TRIG, CHOLHDL, LDLDIRECT in the last 72 hours.  Thyroid Function Tests: No results for input(s): TSH, T4TOTAL, FREET4, T3FREE, THYROIDAB in the last 72 hours.  Anemia Panel: No results for input(s): VITAMINB12, FOLATE, FERRITIN, TIBC, IRON, RETICCTPCT in the last 72 hours.  Urine analysis:    Component Value Date/Time   COLORURINE YELLOW 11/27/2021 1704   APPEARANCEUR HAZY (A) 11/27/2021 1704   LABSPEC 1.019 11/27/2021 1704   PHURINE 5.0 11/27/2021 1704   GLUCOSEU NEGATIVE 11/27/2021 1704   HGBUR MODERATE (A) 11/27/2021 1704    BILIRUBINUR NEGATIVE 11/27/2021 1704   KETONESUR NEGATIVE 11/27/2021 1704   PROTEINUR NEGATIVE 11/27/2021 1704   NITRITE NEGATIVE 11/27/2021 1704   LEUKOCYTESUR NEGATIVE 11/27/2021 1704    Sepsis Labs: Lactic Acid, Venous No results found for: LATICACIDVEN  MICROBIOLOGY: Recent Results (from the past 240 hour(s))  Culture, blood (Routine X 2) w Reflex to ID Panel     Status: None (Preliminary result)   Collection Time: 11/27/21 11:08 AM   Specimen: BLOOD  Result Value Ref Range Status   Specimen Description BLOOD BLOOD LEFT HAND  Final   Special Requests   Final    BOTTLES DRAWN AEROBIC AND ANAEROBIC Blood Culture adequate volume   Culture   Final    NO GROWTH 2 DAYS Performed at [facility], [address] [place], [place] [address]    Report Status PENDING  Incomplete  Culture, blood (Routine X 2) w Reflex to ID Panel     Status: None (Preliminary result)   Collection Time: 11/27/21 11:15 AM   Specimen: BLOOD  Result Value Ref Range Status   Specimen Description BLOOD BLOOD RIGHT HAND  Final   Special Requests   Final    BOTTLES DRAWN AEROBIC AND ANAEROBIC Blood Culture adequate volume   Culture   Final    NO GROWTH 2 DAYS Performed at [facility], [address] [place], [place] [address]    Report Status PENDING  Incomplete  Surgical PCR screen     Status: None   Collection Time: 11/28/21  3:48 AM   Specimen: Nasal Mucosa; Nasal Swab  Result Value Ref Range Status   MRSA, PCR NEGATIVE NEGATIVE Final   Staphylococcus aureus NEGATIVE NEGATIVE Final    Comment: (NOTE) The Xpert SA Assay (FDA approved for NASAL specimens in patients 8 years of age and older), is one component of a comprehensive surveillance program. It is not intended to diagnose infection nor to guide or monitor treatment. Performed at [facility], [address], [place], [place] [address]     RADIOLOGY STUDIES/RESULTS: DG Chest 1 View  Result Date: 11/27/2021 CLINICAL  DATA:  Right hip pain EXAM: CHEST  1 VIEW COMPARISON:  04/12/2008 FINDINGS: Cardiac size is within normal limits. Thoracic aorta is tortuous and ectatic. Lung fields are clear of any infiltrates or pulmonary edema. There is  no significant pleural effusion or pneumothorax. There are small linear densities in the right lower lung fields. IMPRESSION: There are no signs of pulmonary edema or focal pulmonary consolidation. Linear densities in the right lower lung fields may suggest scarring. Electronically Signed   By: Ernie Avena M.D.   On: 11/27/2021 12:17   DG Pelvis Portable  Result Date: 11/28/2021 CLINICAL DATA:  Postop for total hip arthroplasty. EXAM: PORTABLE PELVIS 1-2 VIEWS COMPARISON:  CT 11/28/2021 FINDINGS: Right hip arthroplasty. No hardware complication or acute periprosthetic fracture. Sacroiliac joints are symmetric. IMPRESSION: Expected appearance after right hip arthroplasty. Electronically Signed   By: Jeronimo Greaves M.D.   On: 11/28/2021 18:13   CT HIP RIGHT WO CONTRAST  Result Date: 11/28/2021 CLINICAL DATA:  Severe right hip pain for the past 6 weeks. EXAM: CT OF THE RIGHT HIP WITHOUT CONTRAST TECHNIQUE: Multidetector CT imaging of the right hip was performed according to the standard protocol. Multiplanar CT image reconstructions were also generated. RADIATION DOSE REDUCTION: This exam was performed according to the departmental dose-optimization program which includes automated exposure control, adjustment of the mA and/or kV according to patient size and/or use of iterative reconstruction technique. COMPARISON:  Right hip x-rays from yesterday. MRI right hip dated Nov 26, 2021. FINDINGS: Bones/Joint/Cartilage Right femoral head subchondral fracture and prominent flattening again noted. Prominent subchondral sclerosis and cystic change in the right femoral head and acetabulum with superior migration of the right femur. Marginal osteophytes. No dislocation. Small complex joint  effusion. Ligaments Ligaments are suboptimally evaluated by CT. Muscles and Tendons Grossly intact. Soft tissue No fluid collection or hematoma. No soft tissue mass. Small fat containing right inguinal hernia. IMPRESSION: 1. Similar findings consistent with advanced right femoral head avascular necrosis with secondary end-stage right hip osteoarthritis. Electronically Signed   By: Obie Dredge M.D.   On: 11/28/2021 08:29   DG C-Arm 1-60 Min-No Report  Result Date: 11/28/2021 Fluoroscopy was utilized by the requesting physician.  No radiographic interpretation.   DG C-Arm 1-60 Min-No Report  Result Date: 11/28/2021 Fluoroscopy was utilized by the requesting physician.  No radiographic interpretation.   DG HIP UNILAT WITH PELVIS 1V RIGHT  Result Date: 11/28/2021 CLINICAL DATA:  Right hip surgery. EXAM: DG HIP (WITH OR WITHOUT PELVIS) 1V RIGHT COMPARISON:  Preoperative imaging. FINDINGS: Four fluoroscopic spot views of the right hip and pelvis in frontal projection. Advanced right hip arthropathy with subsequent total hip arthroplasty. Fluoroscopy time 16 seconds. Dose 3.5 mGy IMPRESSION: Intraoperative fluoroscopy during right total hip arthroplasty. Electronically Signed   By: Narda Rutherford M.D.   On: 11/28/2021 17:21   DG HIP UNILAT WITH PELVIS 2-3 VIEWS RIGHT  Result Date: 11/27/2021 CLINICAL DATA:  Right hip pain. EXAM: DG HIP (WITH OR WITHOUT PELVIS) 2-3V RIGHT COMPARISON:  MRI 11/26/2021 FINDINGS: Chronic AVN with marked flattening of the right femoral head. Associated severe secondary a hip joint degenerative changes with severe joint space narrowing, bony eburnation, subchondral cystic change and probable fragmentation. Suspect remote posttraumatic changes involving the right ischium. The left hip is intact. The SI joints are intact. IMPRESSION: 1. 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 2. Severe right hip joint degenerative changes. Electronically Signed   By: Rudie Meyer  M.D.   On: 11/27/2021 12:21     LOS: 2 days   Jeoffrey Massed, MD  [facility]    To contact the attending provider between 7A-7P or the covering provider during after hours 7P-7A, please log into the web  site [email or web address] and access using universal [facility] password for that web site. If you do not have the password, please call the hospital operator.  11/29/2021, 10:32 AM

## 2021-11-29 NOTE — TOC Progression Note (Signed)
Transition of Care [facility]) - Progression Note    Patient Details  Name: Sean [name] MRN: [id number] Date of Birth: [birth year]  Transition of [facility]) CM/SW Contact  Mearl Latin, LCSW Phone Number: 11/29/2021, 12:25 PM  Clinical Narrative:    CSW received consult for possible SNF placement at time of discharge. CSW spoke with patient. Patient expressed understanding of PT recommendation and is agreeable to SNF placement at time of discharge. CSW discussed insurance authorization process and will provide Medicare SNF ratings list. Patient has received COVID vaccines. CSW will send out referrals for review. Patient expressed being hopeful for rehab and to feel better soon. He stated his car is parked at a friend's house where EMS picked him up. He receives SSD income and is searching for a room to rent after SNF rehab. CSW informed him of Medicaid process in case he feels he will need more care. Patient stated he would rather find a room to rent and feels like 20 days of rehab will be sufficient for him to be more mobile. He reported being appreciative of [facility] staff and is wanting to try to walk with therapy.    CSW provided SNF bed offers and ratings list. Patient selected [facility]. CSW will follow up on if they have a bed available. Second choice would be [facility].   Skilled Nursing Rehab Facilities-   [email or web address]   Ratings out of 5 possible   Name Address  Phone # Quality Care Staffing Health Inspection Overall  [facility] [address] [place] [phone] 4 5 2 3   [facility]  [address], [place] [phone] 3 2 5 5   [facility] [address] [place], [address] [place] 3 1 1 1   [facility] [address] 3 2 4 4   [facility] [address], [phone] 1 1 2 1   [facility] [phone] [address], [phone] 2 1 4 3   [facility] [address], [address] 5 2 3 4   [facility] [address], [place] 5 2 2 3   [address] ([place]) [address] [facility] 5 1 2 2   [facility] [phone], [phone] 4 1 2 1   [facility] [address] [facility] [phone] 4 1 2 1   [facility] ([place]) [address], [place]inette Otto [phone] 4 1 1 1   [address] [facility] 3 2 4 4           [facility] [address], [facility] [place]      [facility] [address], [phone] 4 2 3 3   [facility] [place] [address] [phone] 4 1 5 4   [facility], [place] [facility], [address] [place] 2 1 1 1   [facility] [address] [facility] [phone] 2 1 3 2           [address] (no [facility]) [address], [place] [phone] 4 5 5 5   [facility] (No [employer]) [address] [phone] [address] [place] 3 1 4 3   [facility] (No [employer]) [address], [place] [phone] 5 5 5 5   [facility] [address], [address] [phone] 3 2 4 4   [facility] [address], [address] 1 1 2 1   [facility] [address], [name] [phone] 2 1 1 1   [place] [address]  Alinda Deem [phone] 5 2 4 5   [facility] [address], [address] [place] 3 1 1 1   [facility] [address] [place], [place] 2 1 2 1           [facility] [address], [place] [phone] 1 1 1 1   [facility] [address]  [phone] 2 4 2 2   [facility] [place] [address], [address] [phone] 5 2 3 4   [facility] [address], [address] 2 1 1 1   [facility] (No Hum[employer]) [address], [phone] 2 1 3 2   [facility] [address], [phone] 3  1 1 1           [facility] [address] [place], [facility] 5 4 5 5   [facility])  [address], [facility] 2 2 3 3   [address] [facility]) [address] [place], [facility] 3 2 4 4   [facility] [address] [place] [phone] 4 3 4 4   [address] [place], [address] [place] 3 3 1 1   [facility]) [address] [place] [phone] 2 2 4 4       Expected Discharge Plan: Skilled Nursing Facility Barriers to Discharge: Continued Medical Work up, SNF Pending bed offer  Expected Discharge Plan and Services Expected Discharge Plan: Skilled Nursing Facility In-house Referral: Clinical Social Work   Post Acute Care Choice: Skilled Nursing Facility Living arrangements for the past 2 months: Homeless [place])                                       Social Determinants of Health (SDOH) Interventions    Readmission Risk Interventions     View : No data to display.

## 2021-11-30 ENCOUNTER — Inpatient Hospital Stay (HOSPITAL_COMMUNITY): Payer: Medicare Other

## 2021-11-30 DIAGNOSIS — R262 Difficulty in walking, not elsewhere classified: Secondary | ICD-10-CM | POA: Diagnosis not present

## 2021-11-30 LAB — URINALYSIS, ROUTINE W REFLEX MICROSCOPIC
Bilirubin Urine: NEGATIVE
Glucose, UA: NEGATIVE mg/dL
Ketones, ur: NEGATIVE mg/dL
Nitrite: NEGATIVE
Protein, ur: NEGATIVE mg/dL
Specific Gravity, Urine: 1.018 (ref 1.005–1.030)
pH: 5 (ref 5.0–8.0)

## 2021-11-30 LAB — CBC WITH DIFFERENTIAL/PLATELET
Abs Immature Granulocytes: 0.13 10*3/uL — ABNORMAL HIGH (ref 0.00–0.07)
Basophils Absolute: 0 10*3/uL (ref 0.0–0.1)
Basophils Relative: 0 %
Eosinophils Absolute: 0.1 10*3/uL (ref 0.0–0.5)
Eosinophils Relative: 1 %
HCT: 26.4 % — ABNORMAL LOW (ref 39.0–52.0)
Hemoglobin: 9.1 g/dL — ABNORMAL LOW (ref 13.0–17.0)
Immature Granulocytes: 1 %
Lymphocytes Relative: 16 %
Lymphs Abs: 1.9 10*3/uL (ref 0.7–4.0)
MCH: 32.5 pg (ref 26.0–34.0)
MCHC: 34.5 g/dL (ref 30.0–36.0)
MCV: 94.3 fL (ref 80.0–100.0)
Monocytes Absolute: 1.5 10*3/uL — ABNORMAL HIGH (ref 0.1–1.0)
Monocytes Relative: 12 %
Neutro Abs: 8.5 10*3/uL — ABNORMAL HIGH (ref 1.7–7.7)
Neutrophils Relative %: 70 %
Platelets: 262 10*3/uL (ref 150–400)
RBC: 2.8 MIL/uL — ABNORMAL LOW (ref 4.22–5.81)
RDW: 12.9 % (ref 11.5–15.5)
WBC: 12.2 10*3/uL — ABNORMAL HIGH (ref 4.0–10.5)
nRBC: 0 % (ref 0.0–0.2)

## 2021-11-30 LAB — MAGNESIUM: Magnesium: 2.3 mg/dL (ref 1.7–2.4)

## 2021-11-30 LAB — SODIUM, URINE, RANDOM: Sodium, Ur: 75 mmol/L

## 2021-11-30 LAB — COMPREHENSIVE METABOLIC PANEL
ALT: 46 U/L — ABNORMAL HIGH (ref 0–44)
AST: 30 U/L (ref 15–41)
Albumin: 2.9 g/dL — ABNORMAL LOW (ref 3.5–5.0)
Alkaline Phosphatase: 47 U/L (ref 38–126)
Anion gap: 10 (ref 5–15)
BUN: 49 mg/dL — ABNORMAL HIGH (ref 6–20)
CO2: 22 mmol/L (ref 22–32)
Calcium: 8.8 mg/dL — ABNORMAL LOW (ref 8.9–10.3)
Chloride: 98 mmol/L (ref 98–111)
Creatinine, Ser: 1.84 mg/dL — ABNORMAL HIGH (ref 0.61–1.24)
GFR, Estimated: 42 mL/min — ABNORMAL LOW (ref >60)
Glucose, Bld: 178 mg/dL — ABNORMAL HIGH (ref 70–99)
Potassium: 4.4 mmol/L (ref 3.5–5.1)
Sodium: 130 mmol/L — ABNORMAL LOW (ref 135–145)
Total Bilirubin: 0.4 mg/dL (ref 0.3–1.2)
Total Protein: 6.1 g/dL — ABNORMAL LOW (ref 6.5–8.1)

## 2021-11-30 LAB — GLUCOSE, CAPILLARY
Glucose-Capillary: 144 mg/dL — ABNORMAL HIGH (ref 70–99)
Glucose-Capillary: 133 mg/dL — ABNORMAL HIGH (ref 70–99)
Glucose-Capillary: 174 mg/dL — ABNORMAL HIGH (ref 70–99)
Glucose-Capillary: 162 mg/dL — ABNORMAL HIGH (ref 70–99)

## 2021-11-30 LAB — OSMOLALITY, URINE: Osmolality, Ur: 655 mOsm/kg (ref 300–900)

## 2021-11-30 LAB — BRAIN NATRIURETIC PEPTIDE: B Natriuretic Peptide: 5.5 pg/mL (ref 0.0–100.0)

## 2021-11-30 LAB — CREATININE, URINE, RANDOM: Creatinine, Urine: 140.72 mg/dL

## 2021-11-30 MED ORDER — LACTATED RINGERS IV SOLN: INTRAVENOUS | Status: DC

## 2021-11-30 MED ORDER — FUROSEMIDE 10 MG/ML IJ SOLN: 20.0000 mg | Freq: Once | INTRAMUSCULAR | Status: DC

## 2021-11-30 MED ORDER — LACTATED RINGERS IV SOLN: INTRAVENOUS | Status: AC

## 2021-11-30 MED ORDER — TRAMADOL HCL 50 MG PO TABS: 50.0000 mg | ORAL_TABLET | Freq: Three times a day (TID) | ORAL | Status: DC | PRN

## 2021-11-30 MED ORDER — LACTATED RINGERS IV BOLUS: 1000.0000 mL | Freq: Once | INTRAVENOUS | Status: DC

## 2021-11-30 NOTE — Progress Notes (Signed)
See above                        PROGRESS NOTE        PATIENT DETAILS Name: Sean [name] Age: 61 y.o. Sex: male Date of Birth: 11-20-1960 Admit Date: 11/25/2021 Admitting Physician Marrion Coy, MD MPN:[email or web address], [facility]  Brief Summary:  Patient is a 61 y.o.  male with history of chronic right>left hip pain-presented with intractable right hip pain (not relieved by oral narcotics)-with significant leukocytosis unable to ambulate-and subsequently admitted to the hospitalist service.   Significant events: 5/30>> presented to ED with intractable right hip pain-unable to ambulate.  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 6/02>> right hip arthroplasty.  Significant studies: 5/30>> x-ray right knee: No acute radiographic abnormality of the right knee. 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  Small complex right hip joint effusion-likely reactive. 5/31>> MRI lumbosacral spine: Bulky L5-S1 disc osteophyte 6/01>> CXR: No PNA  Significant microbiology data: 6/1>> blood cultures: No growth  Procedures: 6/2>>Right total hip arthroplasty, anterior approach (Dr. Linna Caprice)  Consults: Orthopedics   Subjective:  Patient in bed, appears comfortable, denies any headache, no fever, no chest pain or pressure, no shortness of breath , no abdominal pain. No new focal weakness.   Objective: Vitals: Blood pressure 102/66, pulse (!) 105, temperature 98.3 F (36.8 C), temperature source Oral, resp. rate 18, height 5\' 6"  (1.676 m), weight 127 kg, SpO2 92 %.   Exam:  Awake Alert, No new F.N deficits, Normal affect [place].AT,PERRAL Supple Neck, No JVD,   Symmetrical Chest wall movement, Good air movement bilaterally, CTAB RRR,No Gallops, Rubs or new Murmurs,  +ve B.Sounds, Abd Soft, No tenderness,   No Cyanosis, Clubbing or edema     Assessment/Plan:  Intractable right hip pain due to avascular necrosis with femoral head collapse: S/p total hip arthroplasty on 6/2-orthopedics following-recommendations weightbearing as tolerated on RLE with walker, ASA 81 mg twice daily for VTE prophylaxis.  Await PT/OT-but suspect will require SNF on discharge.  Continue supportive care.   Leukocytosis: No clear foci of infection apparent-CXR/blood cultures/UA all negative for infection.  Leukocytosis has resolved-sepsis/SIRS physiology ruled out.  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  Reduce IV fluid rate, IV fluids needed for AKI.  AKI: Mild-probably hemodynamically mediated 600 cc operative blood loss, was also on ACE inhibitor which has been held, was taking daily Mobic which has been discontinued.  FeNA under 1, Will check renal ultrasound, urine electrolytes noted will monitor closely.  Continue IV fluids for now.  Acute perioperative blood loss anemia: 600 cc blood loss per op note-no indication for transfusion-watch closely.  HTN: Stable-continue amlodipine-holding lisinopril due to mild AKI.  Resume when able.  DM-2 (A1c 6.7 on 6/2): Continue SSI-resume metformin on discharge.    Recent Labs    11/29/21 1600 11/29/21 2209 11/30/21 0735  GLUCAP 233* 254* 144*     Anxiety/depression: Stable-continue trazodone/risperidone/Zoloft.  Homeless: Living in a car prior to this hospitalization-suspect will require SNF on discharge.  Morbid Obesity: Estimated body mass index is 45.19 kg/m as calculated from the following:   Height as of this encounter: 5\' 6"  (1.676 m).   Weight as of this encounter: 127 kg.   Code status:   Code Status: Full Code   DVT Prophylaxis: SCDs Start: 11/28/21 1824   Family Communication: None  at bedside   Disposition Plan:  Status is: Observation The patient will require care spanning > 2 midnights and should be moved to inpatient because: S/p right hip replacement on 6/12-SNF  early next week.  Planned Discharge Destination:Skilled nursing facility   Diet: Diet Order             Diet Carb Modified Fluid consistency: Thin; Room service appropriate? Yes; Fluid restriction: 1500 mL Fluid  Diet effective now                     MEDICATIONS: Scheduled Meds:  aspirin  81 mg Oral BID   benztropine  0.5 mg Oral QHS   Chlorhexidine Gluconate Cloth  6 each Topical Daily   docusate sodium  100 mg Oral BID   FLUoxetine  40 mg Oral q morning   furosemide  20 mg Intravenous Once   insulin aspart  0-9 Units Subcutaneous TID WC   mupirocin ointment  1 application. Nasal BID   pantoprazole  40 mg Oral Q1200   polyethylene glycol  17 g Oral Daily   risperiDONE  1 mg Oral BID   senna  1 tablet Oral BID   traZODone  100 mg Oral QHS   Continuous Infusions:  PRN Meds:.acetaminophen, albuterol, alum & mag hydroxide-simeth, bisacodyl, diphenhydrAMINE, HYDROcodone-acetaminophen, morphine injection, [DISCONTINUED] ondansetron **OR** ondansetron (ZOFRAN) IV, traMADol   I have personally reviewed following labs and imaging studies  LABORATORY DATA:  Recent Labs  Lab 11/26/21 0923 11/27/21 0226 11/28/21 0113 11/29/21 0130 11/30/21 0045  WBC 15.0* 18.6* 9.5 11.4* 12.2*  HGB 13.2 11.2* 11.0* 9.8* 9.1*  HCT 38.1* 32.6* 32.7* 29.1* 26.4*  PLT 242 259 282 260 262  MCV 95.7 93.9 95.1 94.8 94.3  MCH 33.2 32.3 32.0 31.9 32.5  MCHC 34.6 34.4 33.6 33.7 34.5  RDW 13.2 13.2 13.3 12.7 12.9  LYMPHSABS 0.8  --  2.2  --  1.9  MONOABS 0.4  --  1.1*  --  1.5*  EOSABS 0.0  --  0.1  --  0.1  BASOSABS 0.0  --  0.0  --  0.0    Recent Labs  Lab 11/26/21 0923 11/27/21 0226 11/28/21 0113 11/29/21 0130 11/30/21 0045  NA 130* 131* 131* 129* 130*  K 4.4 4.6 4.5 4.6 4.4  CL 95* 99 98 99 98  CO2 24 24 25 23 22   GLUCOSE 185* 134* 103* 175* 178*  BUN 19 27* 39* 40* 49*  CREATININE 1.17 1.20 1.29* 1.39* 1.84*  CALCIUM 10.4* 9.9 9.5 8.9 8.8*  AST  --   --   --   --  30   ALT  --   --   --   --  46*  ALKPHOS  --   --   --   --  47  BILITOT  --   --   --   --  0.4  ALBUMIN  --   --   --   --  2.9*  MG  --   --   --   --  2.3  HGBA1C  --   --  6.7*  --   --   BNP  --   --   --   --  5.5             RADIOLOGY STUDIES/RESULTS: DG Pelvis Portable  Result Date: 11/28/2021 CLINICAL DATA:  Postop for total hip arthroplasty. EXAM: PORTABLE PELVIS 1-2 VIEWS COMPARISON:  CT 11/28/2021 FINDINGS: Right hip  arthroplasty. No hardware complication or acute periprosthetic fracture. Sacroiliac joints are symmetric. IMPRESSION: Expected appearance after right hip arthroplasty. Electronically Signed   By: Jeronimo Greaves M.D.   On: 11/28/2021 18:13   DG C-Arm 1-60 Min-No Report  Result Date: 11/28/2021 Fluoroscopy was utilized by the requesting physician.  No radiographic interpretation.   DG C-Arm 1-60 Min-No Report  Result Date: 11/28/2021 Fluoroscopy was utilized by the requesting physician.  No radiographic interpretation.   DG HIP UNILAT WITH PELVIS 1V RIGHT  Result Date: 11/28/2021 CLINICAL DATA:  Right hip surgery. EXAM: DG HIP (WITH OR WITHOUT PELVIS) 1V RIGHT COMPARISON:  Preoperative imaging. FINDINGS: Four fluoroscopic spot views of the right hip and pelvis in frontal projection. Advanced right hip arthropathy with subsequent total hip arthroplasty. Fluoroscopy time 16 seconds. Dose 3.5 mGy IMPRESSION: Intraoperative fluoroscopy during right total hip arthroplasty. Electronically Signed   By: Narda Rutherford M.D.   On: 11/28/2021 17:21     LOS: 3 days   Signature  Susa Raring M.D on 11/30/2021 at 11:37 AM   -  To page go to [email or web address]

## 2021-11-30 NOTE — Progress Notes (Signed)
Patient ID: Sean [name], male   DOB: [birth year], 61 y.o.   MRN: [id number] Subjective: 2 Days Post-Op Procedure(s) (LRB): ANTERIOR APPROACH TOTAL HIP (Right)    Patient reports pain as moderate. Recognizes more muscle soreness and pain   Objective:   VITALS:   Vitals:   11/30/21 0416 11/30/21 0600  BP: (!) 85/49 128/63  Pulse: 86   Resp: 19 18  Temp: 98.8 F (37.1 C)   SpO2: 92% 93%    Neurovascular intact Incision: dressing C/D/I Drain removed yesterday, site c/d  LABS Recent Labs    11/28/21 0113 11/29/21 0130 11/30/21 0045  HGB 11.0* 9.8* 9.1*  HCT 32.7* 29.1* 26.4*  WBC 9.5 11.4* 12.2*  PLT 282 260 262    Recent Labs    11/28/21 0113 11/29/21 0130 11/30/21 0045  NA 131* 129* 130*  K 4.5 4.6 4.4  BUN 39* 40* 49*  CREATININE 1.29* 1.39* 1.84*  GLUCOSE 103* 175* 178*    No results for input(s): LABPT, INR in the last 72 hours.   Assessment/Plan: 2 Days Post-Op Procedure(s) (LRB): ANTERIOR APPROACH TOTAL HIP (Right)   Advance diet Up with therapy Reviewed exercises he can do while in bed to help work through the soreness and overcome fears of moving Otherwise d/c plan per [employer]

## 2021-12-01 ENCOUNTER — Inpatient Hospital Stay (HOSPITAL_COMMUNITY): Payer: Medicare Other

## 2021-12-01 ENCOUNTER — Encounter (HOSPITAL_COMMUNITY): Payer: Self-pay | Admitting: Orthopedic Surgery

## 2021-12-01 DIAGNOSIS — R262 Difficulty in walking, not elsewhere classified: Secondary | ICD-10-CM | POA: Diagnosis not present

## 2021-12-01 LAB — CBC WITH DIFFERENTIAL/PLATELET
Abs Immature Granulocytes: 0.27 10*3/uL — ABNORMAL HIGH (ref 0.00–0.07)
Basophils Absolute: 0.1 10*3/uL (ref 0.0–0.1)
Basophils Relative: 0 %
Eosinophils Absolute: 0.4 10*3/uL (ref 0.0–0.5)
Eosinophils Relative: 3 %
HCT: 26.8 % — ABNORMAL LOW (ref 39.0–52.0)
Hemoglobin: 9.1 g/dL — ABNORMAL LOW (ref 13.0–17.0)
Immature Granulocytes: 2 %
Lymphocytes Relative: 15 %
Lymphs Abs: 2.2 10*3/uL (ref 0.7–4.0)
MCH: 32.6 pg (ref 26.0–34.0)
MCHC: 34 g/dL (ref 30.0–36.0)
MCV: 96.1 fL (ref 80.0–100.0)
Monocytes Absolute: 1.8 10*3/uL — ABNORMAL HIGH (ref 0.1–1.0)
Monocytes Relative: 13 %
Neutro Abs: 9.8 10*3/uL — ABNORMAL HIGH (ref 1.7–7.7)
Neutrophils Relative %: 67 %
Platelets: 268 10*3/uL (ref 150–400)
RBC: 2.79 MIL/uL — ABNORMAL LOW (ref 4.22–5.81)
RDW: 13.1 % (ref 11.5–15.5)
WBC: 14.6 10*3/uL — ABNORMAL HIGH (ref 4.0–10.5)
nRBC: 0 % (ref 0.0–0.2)

## 2021-12-01 LAB — BRAIN NATRIURETIC PEPTIDE: B Natriuretic Peptide: 14.4 pg/mL (ref 0.0–100.0)

## 2021-12-01 LAB — COMPREHENSIVE METABOLIC PANEL
ALT: 54 U/L — ABNORMAL HIGH (ref 0–44)
AST: 37 U/L (ref 15–41)
Albumin: 2.7 g/dL — ABNORMAL LOW (ref 3.5–5.0)
Alkaline Phosphatase: 61 U/L (ref 38–126)
Anion gap: 8 (ref 5–15)
BUN: 43 mg/dL — ABNORMAL HIGH (ref 6–20)
CO2: 23 mmol/L (ref 22–32)
Calcium: 9 mg/dL (ref 8.9–10.3)
Chloride: 102 mmol/L (ref 98–111)
Creatinine, Ser: 1.31 mg/dL — ABNORMAL HIGH (ref 0.61–1.24)
GFR, Estimated: >60 mL/min (ref >60)
Glucose, Bld: 122 mg/dL — ABNORMAL HIGH (ref 70–99)
Potassium: 5.2 mmol/L — ABNORMAL HIGH (ref 3.5–5.1)
Sodium: 133 mmol/L — ABNORMAL LOW (ref 135–145)
Total Bilirubin: 0.5 mg/dL (ref 0.3–1.2)
Total Protein: 6.4 g/dL — ABNORMAL LOW (ref 6.5–8.1)

## 2021-12-01 LAB — GLUCOSE, CAPILLARY
Glucose-Capillary: 155 mg/dL — ABNORMAL HIGH (ref 70–99)
Glucose-Capillary: 128 mg/dL — ABNORMAL HIGH (ref 70–99)
Glucose-Capillary: 122 mg/dL — ABNORMAL HIGH (ref 70–99)
Glucose-Capillary: 240 mg/dL — ABNORMAL HIGH (ref 70–99)

## 2021-12-01 LAB — MAGNESIUM: Magnesium: 2.4 mg/dL (ref 1.7–2.4)

## 2021-12-01 MED ORDER — HYDROCODONE-ACETAMINOPHEN 10-325 MG PO TABS: 0.5000 | ORAL_TABLET | ORAL | 0 refills | Status: AC | PRN

## 2021-12-01 MED ORDER — SODIUM ZIRCONIUM CYCLOSILICATE 10 G PO PACK
10.0000 g | PACK | Freq: Two times a day (BID) | ORAL | Status: AC
  Administered 2021-12-01: 10 g via ORAL
  Filled 2021-12-01 (×2): qty 1

## 2021-12-01 MED ORDER — LACTATED RINGERS IV SOLN
INTRAVENOUS | Status: AC
Start: 2021-12-01 — End: 2021-12-01

## 2021-12-01 MED ORDER — LACTATED RINGERS IV SOLN
INTRAVENOUS | Status: DC
Start: 2021-12-01 — End: 2021-12-01

## 2021-12-01 MED ORDER — CEPHALEXIN 500 MG PO CAPS
500.0000 mg | ORAL_CAPSULE | Freq: Three times a day (TID) | ORAL | Status: DC
  Administered 2021-12-01 – 2021-12-04 (×11): 500 mg via ORAL
  Filled 2021-12-01 (×11): qty 1

## 2021-12-01 MED ORDER — ASPIRIN 81 MG PO CHEW: 81.0000 mg | CHEWABLE_TABLET | Freq: Two times a day (BID) | ORAL | 0 refills | Status: AC

## 2021-12-01 MED ORDER — METOPROLOL TARTRATE 25 MG PO TABS
25.0000 mg | ORAL_TABLET | Freq: Two times a day (BID) | ORAL | Status: DC
  Administered 2021-12-01 – 2021-12-04 (×7): 25 mg via ORAL
  Filled 2021-12-01 (×7): qty 1

## 2021-12-01 MED ORDER — TAMSULOSIN HCL 0.4 MG PO CAPS
0.4000 mg | ORAL_CAPSULE | Freq: Every day | ORAL | Status: DC
  Administered 2021-12-01 – 2021-12-04 (×4): 0.4 mg via ORAL
  Filled 2021-12-01 (×4): qty 1

## 2021-12-01 NOTE — Progress Notes (Signed)
    Subjective:  Patient reports pain as mild to moderate.  Denies N/V/CP/SOB/Abd pain. Is is very happy with his new hip. He does report some muscle soreness in his thigh. He says that the ice helps. Denies tingling and numbness in his LE bilaterally.   Patient is drinking a lot of juice. Spoke with patient about drinking water to keep his blood glucose down as he has diabetes.   Objective:   VITALS:   Vitals:   11/30/21 2200 11/30/21 2253 11/30/21 2315 12/01/21 0330  BP:   109/61 121/68  Pulse: (!) 106 (!) 102 98 85  Resp:   18 20  Temp:   97.8 F (36.6 C) 97.8 F (36.6 C)  TempSrc:   Oral Oral  SpO2:  91% 90%   Weight:      Height:        Patient is lying comfortably in the bed. NAD. Neurovascular intact Sensation intact distally Intact pulses distally Dorsiflexion/Plantar flexion intact Incision: dressing C/D/I No cellulitis present Compartment soft Dressing over drain site C/D/I.  Lab Results  Component Value Date   WBC 14.6 (H) 12/01/2021   HGB 9.1 (L) 12/01/2021   HCT 26.8 (L) 12/01/2021   MCV 96.1 12/01/2021   PLT 268 12/01/2021   BMET    Component Value Date/Time   NA 133 (L) 12/01/2021 0039   K 5.2 (H) 12/01/2021 0039   CL 102 12/01/2021 0039   CO2 23 12/01/2021 0039   GLUCOSE 122 (H) 12/01/2021 0039   BUN 43 (H) 12/01/2021 0039   CREATININE 1.31 (H) 12/01/2021 0039   CALCIUM 9.0 12/01/2021 0039   GFRNONAA >60 12/01/2021 0039     Assessment/Plan: 3 Days Post-Op   Principal Problem:   Impaired ambulation Active Problems:   Avascular necrosis of bone of hip, right (HCC)   Controlled type 2 diabetes mellitus without complication, without long-term current use of insulin (HCC)   HTN (hypertension)   Obesity, Class III, BMI 40-49.9 (morbid obesity) (HCC)   WBAT with walker DVT ppx: Aspirin 81mg  BID, SCDs, TEDS PO pain control PT/OT: Patient ambulated with therapy 2 days ago and was limited due to pain. Continue  PT.  Dispo: Likely d/c to  SNF due to homelessness. Continue PT. Patient on hospitalist service. Pain medication and DVT ppx printed in chart.     , PA-C 12/01/2021, 7:07 AM  [facility]  [place] [address], [address], [place], [place] Phone: [phone] [email or web address] Facebook  [phone]

## 2021-12-01 NOTE — Progress Notes (Signed)
Occupational Therapy Treatment Patient Details Name: Sean [name] MRN: [id number] DOB: 03-Oct-1960 Today's Date: 12/01/2021   History of present illness 61 yo admited 5/30 with hip pain and impaired gait. 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 Now s/p R THA on 6/2. PMHx: OA, HTN, DM, obesity, anxiety/depression   OT comments  Pt requiring 2 person assist for bed mobility and 1 person assist with second for chair follow to ambulate with RW to door. Pt endorsing soreness in R hip, but willing to push himself . Remains appropriate for SNF level rehab.    Recommendations for follow up therapy are one component of a multi-disciplinary discharge planning process, led by the attending physician.  Recommendations may be updated based on patient status, additional functional criteria and insurance authorization.    Follow Up Recommendations  Skilled nursing-short term rehab (<3 hours/day)    Assistance Recommended at Discharge Frequent or constant Supervision/Assistance  Patient can return home with the following  A little help with walking and/or transfers;A lot of help with bathing/dressing/bathroom;Assistance with cooking/housework;Direct supervision/assist for financial management;Assist for transportation;Help with stairs or ramp for entrance   Equipment Recommendations  Other (comment) (defer to next venue)    Recommendations for Other Services      Precautions / Restrictions Precautions Precautions: Fall Restrictions Weight Bearing Restrictions: Yes RLE Weight Bearing: Weight bearing as tolerated       Mobility Bed Mobility Overal bed mobility: Needs Assistance Bed Mobility: Supine to Sit     Supine to sit: Mod assist, +2 for physical assistance     General bed mobility comments: assist for LEs over EOB and to raise trunk, HOB up    Transfers Overall transfer level: Needs assistance Equipment used: Rolling walker (2  wheels) Transfers: Sit to/from Stand Sit to Stand: +2 physical assistance, Mod assist           General transfer comment: from elevated bed height, cues for foot and hand placement, assist to rise and steady     Balance Overall balance assessment: Needs assistance   Sitting balance-Leahy Scale: Fair     Standing balance support: Bilateral upper extremity supported Standing balance-Leahy Scale: Poor Standing balance comment: heavy reliance on RW support                           ADL either performed or assessed with clinical judgement   ADL Overall ADL's : Needs assistance/impaired                 Upper Body Dressing : Set up;Sitting                   Functional mobility during ADLs: Minimal assistance;+2 for safety/equipment;Rolling walker (2 wheels)      Extremity/Trunk Assessment              Vision       Perception     Praxis      Cognition Arousal/Alertness: Awake/alert Behavior During Therapy: Anxious Overall Cognitive Status: Within Functional Limits for tasks assessed                                 General Comments: Pt not always listening, verbose.        Exercises      Shoulder Instructions       General Comments  Pertinent Vitals/ Pain       Pain Assessment Pain Assessment: Faces Faces Pain Scale: Hurts even more Pain Location: R hip Pain Descriptors / Indicators: Aching, Guarding Pain Intervention(s): Monitored during session, Repositioned, Ice applied  Home Living                                          Prior Functioning/Environment              Frequency  Min 2X/week        Progress Toward Goals  OT Goals(current goals can now be found in the care plan section)  Progress towards OT goals: Progressing toward goals  Acute Rehab OT Goals OT Goal Formulation: With patient Time For Goal Achievement: 12/13/21 Potential to Achieve Goals: Good  Plan  Discharge plan remains appropriate    Co-evaluation    PT/OT/SLP Co-Evaluation/Treatment: Yes Reason for Co-Treatment: For patient/therapist safety   OT goals addressed during session: ADL's and self-care      AM-PAC OT "6 Clicks" Daily Activity     Outcome Measure   Help from another person eating meals?: None Help from another person taking care of personal grooming?: A Little Help from another person toileting, which includes using toliet, bedpan, or urinal?: A Lot Help from another person bathing (including washing, rinsing, drying)?: A Lot Help from another person to put on and taking off regular upper body clothing?: A Little Help from another person to put on and taking off regular lower body clothing?: Total 6 Click Score: 15    End of Session Equipment Utilized During Treatment: Gait belt;Rolling walker (2 wheels)  OT Visit Diagnosis: Unsteadiness on feet (R26.81);Other abnormalities of gait and mobility (R26.89);Muscle weakness (generalized) (M62.81)   Activity Tolerance Patient tolerated treatment well   Patient Left in chair;with call bell/phone within reach;with chair alarm set   Nurse Communication          Time: 8676-7209 OT Time Calculation (min): 31 min  Charges: OT General Charges $OT Visit: 1 Visit OT Treatments $Therapeutic Activity: 8-22 mins  Martie Round, OTR/L Acute Rehabilitation Services Pager: [phone] Office: [phone]  Evern Bio 12/01/2021, 3:48 PM

## 2021-12-01 NOTE — Progress Notes (Signed)
Physical Therapy Treatment Patient Details Name: Sean [name] MRN: [id number] DOB: [birth year] Today's Date: 12/01/2021   History of Present Illness 61 yo admited 5/30 with hip pain and impaired gait. 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 Now s/p R THA on 6/2. PMHx: OA, HTN, DM, obesity, anxiety/depression    PT Comments    Pt was seen for mobility on RW with help for balance, to transition to stand, to demonstrate upright posture and to initiate gait after THA.  Pt is tolerant of all mobility with extra time for support of the RLE on walker, taking his time to cover steps.  With pt being observant of his pain and strength as evolving with activity on RLE, will expect him to make good improvements to reduce time needed to stay in SNF for further strengthening, standing tolerance and for quality of gait.  Follow acutely for goals of PT in POC.   Recommendations for follow up therapy are one component of a multi-disciplinary discharge planning process, led by the attending physician.  Recommendations may be updated based on patient status, additional functional criteria and insurance authorization.  Follow Up Recommendations  Skilled nursing-short term rehab (<3 hours/day)     Assistance Recommended at Discharge Frequent or constant Supervision/Assistance  Patient can return home with the following A lot of help with walking and/or transfers;A lot of help with bathing/dressing/bathroom;Assist for transportation;Assistance with cooking/housework;Help with stairs or ramp for entrance   Equipment Recommendations  Rolling walker (2 wheels);BSC/3in1    Recommendations for Other Services       Precautions / Restrictions Precautions Precautions: Fall Precaution Comments: monitor vitals Restrictions Weight Bearing Restrictions: Yes RLE Weight Bearing: Weight bearing as tolerated     Mobility  Bed Mobility Overal bed mobility: Needs  Assistance Bed Mobility: Supine to Sit     Supine to sit: Mod assist, +2 for physical assistance, +2 for safety/equipment     General bed mobility comments: assisted hips and RLE for transition to side of bed    Transfers Overall transfer level: Needs assistance Equipment used: Rolling walker (2 wheels) Transfers: Sit to/from Stand Sit to Stand: Mod assist, +2 physical assistance, +2 safety/equipment           General transfer comment: pt continually asked PT and OT to elevate bed more    Ambulation/Gait Ambulation/Gait assistance: Min guard, Min assist Gait Distance (Feet): 30 Feet Assistive device: Rolling walker (2 wheels) Gait Pattern/deviations: Step-to pattern, Step-through pattern, Decreased stride length, Trunk flexed, Wide base of support Gait velocity: reduced Gait velocity interpretation: <1.31 ft/sec, indicative of household ambulator Pre-gait activities: standing balance ck General Gait Details: RLE is guarded by pt   Stairs             Wheelchair Mobility    Modified Rankin (Stroke Patients Only)       Balance Overall balance assessment: Needs assistance Sitting-balance support: Bilateral upper extremity supported, Feet supported Sitting balance-Leahy Scale: Fair     Standing balance support: Bilateral upper extremity supported, During functional activity Standing balance-Leahy Scale: Poor                              Cognition Arousal/Alertness: Awake/alert Behavior During Therapy: Anxious Overall Cognitive Status: Within Functional Limits for tasks assessed  General Comments: pt is distracted by his pain and random thoughts        Exercises      General Comments General comments (skin integrity, edema, etc.): pt is up to side of bed and requires length of time to finish sequence of standing, frequently distracted and unfocused      Pertinent Vitals/Pain Pain  Assessment Pain Assessment: Faces Faces Pain Scale: Hurts even more Pain Location: R hip Pain Descriptors / Indicators: Grimacing, Guarding Pain Intervention(s): Limited activity within patient's tolerance, Monitored during session, Repositioned, Ice applied    Home Living                          Prior Function            PT Goals (current goals can now be found in the care plan section) Acute Rehab PT Goals Patient Stated Goal: be able to walk Progress towards PT goals: Progressing toward goals    Frequency    Min 3X/week      PT Plan Current plan remains appropriate    Co-evaluation PT/OT/SLP Co-Evaluation/Treatment: Yes Reason for Co-Treatment: For patient/therapist safety;Complexity of the patient's impairments (multi-system involvement) PT goals addressed during session: Mobility/safety with mobility;Proper use of DME;Balance OT goals addressed during session: ADL's and self-care      AM-PAC PT "6 Clicks" Mobility   Outcome Measure  Help needed turning from your back to your side while in a flat bed without using bedrails?: Total Help needed moving from lying on your back to sitting on the side of a flat bed without using bedrails?: Total Help needed moving to and from a bed to a chair (including a wheelchair)?: Total Help needed standing up from a chair using your arms (e.g., wheelchair or bedside chair)?: Total Help needed to walk in hospital room?: Total Help needed climbing 3-5 steps with a railing? : Total 6 Click Score: 6    End of Session Equipment Utilized During Treatment: Gait belt Activity Tolerance: Patient limited by pain;Patient limited by fatigue Patient left: in bed;with call bell/phone within reach;with bed alarm set Nurse Communication: Mobility status PT Visit Diagnosis: Other abnormalities of gait and mobility (R26.89);Difficulty in walking, not elsewhere classified (R26.2);Muscle weakness (generalized) (M62.81);Pain Pain -  Right/Left: Right Pain - part of body: Hip     Time: [id number] PT Time Calculation (min) (ACUTE ONLY): 32 min  Charges:  $Gait Training: 8-22 mins       Ramond Dial 12/01/2021, 4:38 PM  Mee Hives, PT PhD Acute Rehab Dept. Number: [place] [facility] [place]

## 2021-12-01 NOTE — Progress Notes (Addendum)
See above                        PROGRESS NOTE        PATIENT DETAILS Name: Sean [name] Age: 61 y.o. Sex: male Date of Birth: 02-Jul-1960 Admit Date: 11/25/2021 Admitting Physician Marrion Coy, MD [id number], [facility]  Brief Summary:  Patient is a 61 y.o.  male with history of chronic right>left hip pain-presented with intractable right hip pain (not relieved by oral narcotics)-with significant leukocytosis unable to ambulate-and subsequently admitted to the hospitalist service.   Significant events: 5/30>> presented to ED with intractable right hip pain-unable to ambulate.  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 6/02>> right hip arthroplasty.  Significant studies: 5/30>> x-ray right knee: No acute radiographic abnormality of the right knee. 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  Small complex right hip joint effusion-likely reactive. 5/31>> MRI lumbosacral spine: Bulky L5-S1 disc osteophyte 6/01>> CXR: No PNA  Significant microbiology data: 6/1>> blood cultures: No growth  Procedures: 6/2>>Right total hip arthroplasty, anterior approach (Dr. Linna Caprice)  Consults: Orthopedics   Subjective:  Patient in bed, appears comfortable, denies any headache, no fever, no chest pain or pressure, no shortness of breath , no abdominal pain. No new focal weakness.  Mild postoperative right hip muscle soreness.   Objective: Vitals: Blood pressure 120/68, pulse (!) 108, temperature 97.8 F (36.6 C), temperature source Oral, resp. rate 18, height 5\' 6"  (1.676 m), weight 127 kg, SpO2 92 %.   Exam:  Awake Alert, No new F.N deficits, Normal affect [place].AT,PERRAL Supple Neck, No JVD,   Symmetrical Chest wall movement, Good air movement bilaterally, CTAB RRR,No Gallops, Rubs or new Murmurs,  +ve B.Sounds, Abd Soft, No tenderness,   No  Cyanosis, Clubbing or edema     Assessment/Plan:  Intractable right hip pain due to avascular necrosis with femoral head collapse: S/p total hip arthroplasty on 6/2-orthopedics following-recommendations weightbearing as tolerated on RLE with walker, ASA 81 mg twice daily for VTE prophylaxis.  Await PT/OT-but suspect will require SNF on discharge.  Continue supportive care.   Leukocytosis: No clear foci of infection apparent-CXR/blood cultures/UA all negative for infection.  Leukocytosis has resolved-sepsis/SIRS physiology ruled out.  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  Reduce IV fluid rate, IV fluids needed for AKI.  AKI: Mild-probably hemodynamically mediated 600 cc operative blood loss, was also on ACE inhibitor which has been held, was taking daily Mobic which has been discontinued.  FeNA under 1, renal ultrasound 1 acute with some chronic cortical thinning, with hydration renal function much improved.  Continue to monitor.  Hyperkalemia - Lokelma  Postoperative urinary retention.  On Flomax, trial of Foley removal on 12/01/2021 and monitor.    Acute perioperative blood loss anemia: 600 cc blood loss per op note-no indication for transfusion-watch closely.  HTN: Will place on Lopressor and monitor.  ACE inhibitor held due to AKI.  DM-2 (A1c 6.7 on 6/2): Continue SSI-resume metformin on discharge.    Recent Labs    11/30/21 1611 11/30/21 2108 12/01/21 0824  GLUCAP 174* 162* 155*     Anxiety/depression: Stable-continue trazodone/risperidone/Zoloft.  Homeless: Living in a car prior to this hospitalization-suspect will require SNF on discharge.  Morbid Obesity: Estimated body mass index is 45.19 kg/m as calculated from the following:   Height as of this encounter: 5\' 6"  (  1.676 m).   Weight as of this encounter: 127 kg.   Code status:   Code Status: Full Code   DVT Prophylaxis: SCDs Start: 11/28/21 1824   Family Communication: None at bedside   Disposition  Plan:  Status is: Observation The patient will require care spanning > 2 midnights and should be moved to inpatient because: S/p right hip replacement on 6/12-SNF early next week.  Planned Discharge Destination:Skilled nursing facility   Diet: Diet Order             Diet Carb Modified Fluid consistency: Thin; Room service appropriate? Yes; Fluid restriction: 1500 mL Fluid  Diet effective now                     MEDICATIONS: Scheduled Meds:  aspirin  81 mg Oral BID   benztropine  0.5 mg Oral QHS   cephALEXin  500 mg Oral Q8H   Chlorhexidine Gluconate Cloth  6 each Topical Daily   docusate sodium  100 mg Oral BID   FLUoxetine  40 mg Oral q morning   insulin aspart  0-9 Units Subcutaneous TID WC   mupirocin ointment  1 application. Nasal BID   pantoprazole  40 mg Oral Q1200   polyethylene glycol  17 g Oral Daily   risperiDONE  1 mg Oral BID   senna  1 tablet Oral BID   sodium zirconium cyclosilicate  10 g Oral BID   tamsulosin  0.4 mg Oral Daily   traZODone  100 mg Oral QHS   Continuous Infusions:  lactated ringers 75 mL/hr at 12/01/21 0908    PRN Meds:.acetaminophen, albuterol, alum & mag hydroxide-simeth, bisacodyl, diphenhydrAMINE, HYDROcodone-acetaminophen, morphine injection, [DISCONTINUED] ondansetron **OR** ondansetron (ZOFRAN) IV, traMADol   I have personally reviewed following labs and imaging studies  LABORATORY DATA:  Recent Labs  Lab 11/26/21 0923 11/27/21 0226 11/28/21 0113 11/29/21 0130 11/30/21 0045 12/01/21 0039  WBC 15.0* 18.6* 9.5 11.4* 12.2* 14.6*  HGB 13.2 11.2* 11.0* 9.8* 9.1* 9.1*  HCT 38.1* 32.6* 32.7* 29.1* 26.4* 26.8*  PLT 242 259 282 260 262 268  MCV 95.7 93.9 95.1 94.8 94.3 96.1  MCH 33.2 32.3 32.0 31.9 32.5 32.6  MCHC 34.6 34.4 33.6 33.7 34.5 34.0  RDW 13.2 13.2 13.3 12.7 12.9 13.1  LYMPHSABS 0.8  --  2.2  --  1.9 2.2  MONOABS 0.4  --  1.1*  --  1.5* 1.8*  EOSABS 0.0  --  0.1  --  0.1 0.4  BASOSABS 0.0  --  0.0  --  0.0  0.1    Recent Labs  Lab 11/27/21 0226 11/28/21 0113 11/29/21 0130 11/30/21 0045 12/01/21 0039  NA 131* 131* 129* 130* 133*  K 4.6 4.5 4.6 4.4 5.2*  CL 99 98 99 98 102  CO2 24 25 23 22 23   GLUCOSE 134* 103* 175* 178* 122*  BUN 27* 39* 40* 49* 43*  CREATININE 1.20 1.29* 1.39* 1.84* 1.31*  CALCIUM 9.9 9.5 8.9 8.8* 9.0  AST  --   --   --  30 37  ALT  --   --   --  46* 54*  ALKPHOS  --   --   --  47 61  BILITOT  --   --   --  0.4 0.5  ALBUMIN  --   --   --  2.9* 2.7*  MG  --   --   --  2.3 2.4  HGBA1C  --  6.7*  --   --   --  BNP  --   --   --  5.5 14.4      RADIOLOGY STUDIES/RESULTS: US RENAL  Result Date: 11/30/2021 CLINICAL DATA:  60 year old male with acute kidney injury. EXAM: RENAL / URINARY TRACT ULTRASOUND COMPLETE COMPARISON:  None Available. FINDINGS: Right Kidney: Renal measurements: 11.6 x 5.4 x 5.9 cm = volume: 194 mL. Cortical thinning noted. Normal renal echogenicity identified without hydronephrosis or renal mass. Left Kidney: Renal measurements: 11.2 x 5.8 x 5.7 cm = volume: 192 mL. Cortical thinning noted. Normal renal echogenicity identified without hydronephrosis or renal mass. Bladder: Foley catheter is present within a collapsed bladder. Other: None. IMPRESSION: 1. Bilateral renal cortical thinning with normal renal echogenicity. No hydronephrosis or renal mass. 2. Foley catheter within a collapsed bladder. Electronically Signed   By: Harmon Pier M.D.   On: 11/30/2021 15:06   DG Chest Port 1 View  Result Date: 12/01/2021 CLINICAL DATA:  Shortness of breath EXAM: PORTABLE CHEST 1 VIEW COMPARISON:  November 27, 2021 FINDINGS: The heart size and mediastinal contours are within normal limits. Mild linear atelectasis or scar is identified in bilateral lung bases. There is no focal infiltrate, pulmonary edema, or pleural effusion. The visualized skeletal structures are stable. IMPRESSION: No active disease. Electronically Signed   By: Sherian Rein M.D.   On: 12/01/2021 07:37      LOS: 4 days   Signature  Susa Raring M.D on 12/01/2021 at 11:08 AM   -  To page go to [email or web address]

## 2021-12-02 ENCOUNTER — Inpatient Hospital Stay (HOSPITAL_COMMUNITY): Payer: Medicare Other

## 2021-12-02 DIAGNOSIS — R262 Difficulty in walking, not elsewhere classified: Secondary | ICD-10-CM | POA: Diagnosis not present

## 2021-12-02 LAB — COMPREHENSIVE METABOLIC PANEL
ALT: 60 U/L — ABNORMAL HIGH (ref 0–44)
AST: 35 U/L (ref 15–41)
Albumin: 2.6 g/dL — ABNORMAL LOW (ref 3.5–5.0)
Alkaline Phosphatase: 68 U/L (ref 38–126)
Anion gap: 5 (ref 5–15)
BUN: 34 mg/dL — ABNORMAL HIGH (ref 6–20)
CO2: 26 mmol/L (ref 22–32)
Calcium: 9.2 mg/dL (ref 8.9–10.3)
Chloride: 102 mmol/L (ref 98–111)
Creatinine, Ser: 1.14 mg/dL (ref 0.61–1.24)
GFR, Estimated: >60 mL/min (ref >60)
Glucose, Bld: 120 mg/dL — ABNORMAL HIGH (ref 70–99)
Potassium: 4.7 mmol/L (ref 3.5–5.1)
Sodium: 133 mmol/L — ABNORMAL LOW (ref 135–145)
Total Bilirubin: 0.5 mg/dL (ref 0.3–1.2)
Total Protein: 6.4 g/dL — ABNORMAL LOW (ref 6.5–8.1)

## 2021-12-02 LAB — CBC WITH DIFFERENTIAL/PLATELET
Abs Immature Granulocytes: 0.32 10*3/uL — ABNORMAL HIGH (ref 0.00–0.07)
Basophils Absolute: 0.1 10*3/uL (ref 0.0–0.1)
Basophils Relative: 0 %
Eosinophils Absolute: 0.7 10*3/uL — ABNORMAL HIGH (ref 0.0–0.5)
Eosinophils Relative: 5 %
HCT: 25.9 % — ABNORMAL LOW (ref 39.0–52.0)
Hemoglobin: 8.8 g/dL — ABNORMAL LOW (ref 13.0–17.0)
Immature Granulocytes: 2 %
Lymphocytes Relative: 15 %
Lymphs Abs: 2.1 10*3/uL (ref 0.7–4.0)
MCH: 32.6 pg (ref 26.0–34.0)
MCHC: 34 g/dL (ref 30.0–36.0)
MCV: 95.9 fL (ref 80.0–100.0)
Monocytes Absolute: 1.4 10*3/uL — ABNORMAL HIGH (ref 0.1–1.0)
Monocytes Relative: 10 %
Neutro Abs: 9.2 10*3/uL — ABNORMAL HIGH (ref 1.7–7.7)
Neutrophils Relative %: 68 %
Platelets: 284 10*3/uL (ref 150–400)
RBC: 2.7 MIL/uL — ABNORMAL LOW (ref 4.22–5.81)
RDW: 13 % (ref 11.5–15.5)
WBC: 13.7 10*3/uL — ABNORMAL HIGH (ref 4.0–10.5)
nRBC: 0 % (ref 0.0–0.2)

## 2021-12-02 LAB — URINALYSIS, ROUTINE W REFLEX MICROSCOPIC
Bacteria, UA: NONE SEEN
Bilirubin Urine: NEGATIVE
Glucose, UA: NEGATIVE mg/dL
Ketones, ur: NEGATIVE mg/dL
Leukocytes,Ua: NEGATIVE
Nitrite: NEGATIVE
Protein, ur: NEGATIVE mg/dL
Specific Gravity, Urine: 1.019 (ref 1.005–1.030)
pH: 5 (ref 5.0–8.0)

## 2021-12-02 LAB — CULTURE, BLOOD (ROUTINE X 2)
Culture: NO GROWTH
Culture: NO GROWTH
Special Requests: ADEQUATE
Special Requests: ADEQUATE

## 2021-12-02 LAB — PROCALCITONIN: Procalcitonin: 0.13 ng/mL

## 2021-12-02 LAB — GLUCOSE, CAPILLARY
Glucose-Capillary: 128 mg/dL — ABNORMAL HIGH (ref 70–99)
Glucose-Capillary: 151 mg/dL — ABNORMAL HIGH (ref 70–99)
Glucose-Capillary: 165 mg/dL — ABNORMAL HIGH (ref 70–99)
Glucose-Capillary: 152 mg/dL — ABNORMAL HIGH (ref 70–99)

## 2021-12-02 LAB — C-REACTIVE PROTEIN: CRP: 12 mg/dL — ABNORMAL HIGH (ref <1.0)

## 2021-12-02 LAB — BRAIN NATRIURETIC PEPTIDE: B Natriuretic Peptide: 10.6 pg/mL (ref 0.0–100.0)

## 2021-12-02 LAB — UREA NITROGEN, URINE: Urea Nitrogen, Ur: 1093 mg/dL

## 2021-12-02 LAB — MAGNESIUM: Magnesium: 2.3 mg/dL (ref 1.7–2.4)

## 2021-12-02 NOTE — Progress Notes (Signed)
Patient bladder scan for 179cc

## 2021-12-02 NOTE — TOC Progression Note (Signed)
Transition of Care [facility]) - Progression Note    Patient Details  Name: Sean [name] MRN: [id number] Date of Birth: 31-Oct-1960  Transition of Care [facility]) CM/SW Contact  Mearl Latin, LCSW Phone Number: 12/02/2021, 5:53 PM  Clinical Narrative:    CSW spoke with patient and alerted him that [facility] can accept him. CSW provided additional resources to patient. He stated that he wanted to make sure that [facility] knew that he was only going to stay there two weeks and that he wants to be independent and working on his car by then. CSW expressed understanding and let him know that insurance would only cover up to 20 days at 100% anyway. CSW provided Medicaid application to patient but he stated that he worked at [employer] for 20 years and is not sure how he feels about asking for help from [facility] when he may not need those services.    Expected Discharge Plan: Skilled Nursing Facility Barriers to Discharge: Continued Medical Work up, SNF Pending bed offer  Expected Discharge Plan and Services Expected Discharge Plan: Skilled Nursing Facility In-house Referral: Clinical Social Work   Post Acute Care Choice: Skilled Nursing Facility Living arrangements for the past 2 months: Homeless [employer])                                       Social Determinants of Health (SDOH) Interventions    Readmission Risk Interventions     View : No data to display.

## 2021-12-02 NOTE — Progress Notes (Signed)
See above                        PROGRESS NOTE        PATIENT DETAILS Name: Sean [name] Age: 61 y.o. Sex: male Date of Birth: October 13, 1960 Admit Date: 11/25/2021 Admitting Physician Marrion Coy, MD [id number], [facility]  Brief Summary:  Patient is a 61 y.o.  male with history of chronic right>left hip pain-presented with intractable right hip pain (not relieved by oral narcotics)-with significant leukocytosis unable to ambulate-and subsequently admitted to the hospitalist service.   Significant events: 5/30>> presented to ED with intractable right hip pain-unable to ambulate.  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 6/02>> right hip arthroplasty.  Significant studies: 5/30>> x-ray right knee: No acute radiographic abnormality of the right knee. 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  Small complex right hip joint effusion-likely reactive. 5/31>> MRI lumbosacral spine: Bulky L5-S1 disc osteophyte 6/01>> CXR: No PNA  Significant microbiology data: 6/1>> blood cultures: No growth  Procedures: 6/2>>Right total hip arthroplasty, anterior approach (Dr. Linna Caprice)  Consults: Orthopedics   Subjective:  Patient in bed, appears comfortable, denies any headache, no fever, no chest pain or pressure, no shortness of breath , no abdominal pain. No new focal weakness.  Objective:  Vitals: Blood pressure (!) 111/58, pulse 80, temperature 98.3 F (36.8 C), temperature source Oral, resp. rate 19, height 5\' 6"  (1.676 m), weight 127 kg, SpO2 90 %.   Exam:  Awake Alert, No new F.N deficits, Normal affect [place].AT,PERRAL Supple Neck, No JVD,   Symmetrical Chest wall movement, Good air movement bilaterally, CTAB RRR,No Gallops, Rubs or new Murmurs,  +ve B.Sounds, Abd Soft, No tenderness,   No Cyanosis, Clubbing or edema     Assessment/Plan:  Intractable right hip pain due to avascular necrosis with femoral head collapse: S/p total hip arthroplasty on 6/2-orthopedics following - recommendations weightbearing as tolerated on RLE with walker, ASA 81 mg twice daily for VTE prophylaxis.  Await PT/OT- will require SNFon discharge. Continue supportive care.   Leukocytosis: No clear foci of infection apparent-CXR/blood cultures/UA all negative for infection.  Leukocytosis has resolved-sepsis/SIRS physiology ruled out.  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  Reduce IV fluid rate, IV fluids needed for AKI.  AKI: Mild-probably hemodynamically mediated 600 cc operative blood loss, was also on ACE inhibitor which has been held, was taking daily Mobic which has been discontinued.  FeNA under 1, renal ultrasound 1 acute with some chronic cortical thinning, with hydration renal function much improved.  Continue to monitor.  Hyperkalemia - Lokelma, resolved.  Postoperative urinary retention.  On Flomax, trial of Foley removal on 12/01/2021, stable post void residuals.  Acute perioperative blood loss anemia: 600 cc blood loss per op note-no indication for transfusion-watch closely.  HTN: Will place on Lopressor and monitor.  ACE inhibitor held due to AKI.  DM-2 (A1c 6.7 on 6/2): Continue SSI-resume metformin on discharge.    Recent Labs    12/01/21 1604 12/01/21 2112 12/02/21 0805  GLUCAP 122* 240* 128*     Anxiety/depression: Stable-continue trazodone/risperidone/Zoloft.  Homeless: Living in a car prior to this hospitalization-suspect will require SNF on discharge.  Morbid Obesity: Estimated body mass index is 45.19 kg/m as calculated from the following:   Height as of this encounter: 5\' 6"  (1.676 m).   Weight as of this  encounter: 127 kg.   Code status:   Code Status: Full Code   DVT Prophylaxis: SCDs Start: 11/28/21 1824   Family Communication: None at bedside   Disposition Plan:  Status  is: Observation The patient will require care spanning > 2 midnights and should be moved to inpatient because: S/p right hip replacement on 6/12-SNF early next week.  Planned Discharge Destination:Skilled nursing facility   Diet: Diet Order             Diet Carb Modified Fluid consistency: Thin; Room service appropriate? Yes; Fluid restriction: 1500 mL Fluid  Diet effective now                     MEDICATIONS: Scheduled Meds:  aspirin  81 mg Oral BID   benztropine  0.5 mg Oral QHS   cephALEXin  500 mg Oral Q8H   docusate sodium  100 mg Oral BID   FLUoxetine  40 mg Oral q morning   insulin aspart  0-9 Units Subcutaneous TID WC   metoprolol tartrate  25 mg Oral BID   mupirocin ointment  1 application. Nasal BID   pantoprazole  40 mg Oral Q1200   polyethylene glycol  17 g Oral Daily   risperiDONE  1 mg Oral BID   senna  1 tablet Oral BID   tamsulosin  0.4 mg Oral Daily   traZODone  100 mg Oral QHS   Continuous Infusions:  PRN Meds:.acetaminophen, albuterol, alum & mag hydroxide-simeth, bisacodyl, diphenhydrAMINE, HYDROcodone-acetaminophen, morphine injection, [DISCONTINUED] ondansetron **OR** ondansetron (ZOFRAN) IV, traMADol   I have personally reviewed following labs and imaging studies  LABORATORY DATA:  Recent Labs  Lab 11/26/21 0923 11/27/21 0226 11/28/21 0113 11/29/21 0130 11/30/21 0045 12/01/21 0039 12/02/21 0153  WBC 15.0*   < > 9.5 11.4* 12.2* 14.6* 13.7*  HGB 13.2   < > 11.0* 9.8* 9.1* 9.1* 8.8*  HCT 38.1*   < > 32.7* 29.1* 26.4* 26.8* 25.9*  PLT 242   < > 282 260 262 268 284  MCV 95.7   < > 95.1 94.8 94.3 96.1 95.9  MCH 33.2   < > 32.0 31.9 32.5 32.6 32.6  MCHC 34.6   < > 33.6 33.7 34.5 34.0 34.0  RDW 13.2   < > 13.3 12.7 12.9 13.1 13.0  LYMPHSABS 0.8  --  2.2  --  1.9 2.2 2.1  MONOABS 0.4  --  1.1*  --  1.5* 1.8* 1.4*  EOSABS 0.0  --  0.1  --  0.1 0.4 0.7*  BASOSABS 0.0  --  0.0  --  0.0 0.1 0.1   < > = values in this interval not displayed.     Recent Labs  Lab 11/28/21 0113 11/29/21 0130 11/30/21 0045 12/01/21 0039 12/02/21 0153 12/02/21 0815  NA 131* 129* 130* 133* 133*  --   K 4.5 4.6 4.4 5.2* 4.7  --   CL 98 99 98 102 102  --   CO2 25 23 22 23 26   --   GLUCOSE 103* 175* 178* 122* 120*  --   BUN 39* 40* 49* 43* 34*  --   CREATININE 1.29* 1.39* 1.84* 1.31* 1.14  --   CALCIUM 9.5 8.9 8.8* 9.0 9.2  --   AST  --   --  30 37 35  --   ALT  --   --  46* 54* 60*  --   ALKPHOS  --   --  47 61 68  --  BILITOT  --   --  0.4 0.5 0.5  --   ALBUMIN  --   --  2.9* 2.7* 2.6*  --   MG  --   --  2.3 2.4 2.3  --   CRP  --   --   --   --   --  12.0*  PROCALCITON  --   --   --   --  0.13  --   HGBA1C 6.7*  --   --   --   --   --   BNP  --   --  5.5 14.4 10.6  --       RADIOLOGY STUDIES/RESULTS: US RENAL  Result Date: 11/30/2021 CLINICAL DATA:  61 year old male with acute kidney injury. EXAM: RENAL / URINARY TRACT ULTRASOUND COMPLETE COMPARISON:  None Available. FINDINGS: Right Kidney: Renal measurements: 11.6 x 5.4 x 5.9 cm = volume: 194 mL. Cortical thinning noted. Normal renal echogenicity identified without hydronephrosis or renal mass. Left Kidney: Renal measurements: 11.2 x 5.8 x 5.7 cm = volume: 192 mL. Cortical thinning noted. Normal renal echogenicity identified without hydronephrosis or renal mass. Bladder: Foley catheter is present within a collapsed bladder. Other: None. IMPRESSION: 1. Bilateral renal cortical thinning with normal renal echogenicity. No hydronephrosis or renal mass. 2. Foley catheter within a collapsed bladder. Electronically Signed   By: Harmon Pier M.D.   On: 11/30/2021 15:06   DG Chest Port 1 View  Result Date: 12/02/2021 CLINICAL DATA:  Shortness of breath EXAM: PORTABLE CHEST 1 VIEW COMPARISON:  Radiograph December 01, 2021 FINDINGS: The heart size and mediastinal contours are within normal limits. Low lung volumes with bibasilar atelectasis. No visible pleural effusion or pneumothorax. The visualized  skeletal structures are unchanged. IMPRESSION: Low lung volumes with bibasilar atelectasis. Electronically Signed   By: Maudry Mayhew M.D.   On: 12/02/2021 10:01   DG Chest Port 1 View  Result Date: 12/01/2021 CLINICAL DATA:  Shortness of breath EXAM: PORTABLE CHEST 1 VIEW COMPARISON:  November 27, 2021 FINDINGS: The heart size and mediastinal contours are within normal limits. Mild linear atelectasis or scar is identified in bilateral lung bases. There is no focal infiltrate, pulmonary edema, or pleural effusion. The visualized skeletal structures are stable. IMPRESSION: No active disease. Electronically Signed   By: Sherian Rein M.D.   On: 12/01/2021 07:37     LOS: 5 days   Signature  Susa Raring M.D on 12/02/2021 at 11:27 AM   -  To page go to [email or web address]

## 2021-12-03 ENCOUNTER — Encounter (HOSPITAL_COMMUNITY): Payer: Self-pay | Admitting: Orthopedic Surgery

## 2021-12-03 DIAGNOSIS — R262 Difficulty in walking, not elsewhere classified: Secondary | ICD-10-CM | POA: Diagnosis not present

## 2021-12-03 LAB — CBC WITH DIFFERENTIAL/PLATELET
Abs Immature Granulocytes: 0.32 10*3/uL — ABNORMAL HIGH (ref 0.00–0.07)
Basophils Absolute: 0.1 10*3/uL (ref 0.0–0.1)
Basophils Relative: 1 %
Eosinophils Absolute: 0.6 10*3/uL — ABNORMAL HIGH (ref 0.0–0.5)
Eosinophils Relative: 4 %
HCT: 27.3 % — ABNORMAL LOW (ref 39.0–52.0)
Hemoglobin: 9.2 g/dL — ABNORMAL LOW (ref 13.0–17.0)
Immature Granulocytes: 3 %
Lymphocytes Relative: 19 %
Lymphs Abs: 2.4 10*3/uL (ref 0.7–4.0)
MCH: 31.9 pg (ref 26.0–34.0)
MCHC: 33.7 g/dL (ref 30.0–36.0)
MCV: 94.8 fL (ref 80.0–100.0)
Monocytes Absolute: 1.4 10*3/uL — ABNORMAL HIGH (ref 0.1–1.0)
Monocytes Relative: 11 %
Neutro Abs: 8 10*3/uL — ABNORMAL HIGH (ref 1.7–7.7)
Neutrophils Relative %: 62 %
Platelets: 326 10*3/uL (ref 150–400)
RBC: 2.88 MIL/uL — ABNORMAL LOW (ref 4.22–5.81)
RDW: 12.5 % (ref 11.5–15.5)
WBC: 12.8 10*3/uL — ABNORMAL HIGH (ref 4.0–10.5)
nRBC: 0 % (ref 0.0–0.2)

## 2021-12-03 LAB — C-REACTIVE PROTEIN: CRP: 9.8 mg/dL — ABNORMAL HIGH (ref <1.0)

## 2021-12-03 LAB — URINE CULTURE: Culture: <10000 — AB

## 2021-12-03 LAB — COMPREHENSIVE METABOLIC PANEL
ALT: 90 U/L — ABNORMAL HIGH (ref 0–44)
AST: 47 U/L — ABNORMAL HIGH (ref 15–41)
Albumin: 2.7 g/dL — ABNORMAL LOW (ref 3.5–5.0)
Alkaline Phosphatase: 88 U/L (ref 38–126)
Anion gap: 11 (ref 5–15)
BUN: 28 mg/dL — ABNORMAL HIGH (ref 6–20)
CO2: 23 mmol/L (ref 22–32)
Calcium: 9.1 mg/dL (ref 8.9–10.3)
Chloride: 99 mmol/L (ref 98–111)
Creatinine, Ser: 1.06 mg/dL (ref 0.61–1.24)
GFR, Estimated: >60 mL/min (ref >60)
Glucose, Bld: 113 mg/dL — ABNORMAL HIGH (ref 70–99)
Potassium: 4.9 mmol/L (ref 3.5–5.1)
Sodium: 133 mmol/L — ABNORMAL LOW (ref 135–145)
Total Bilirubin: 0.6 mg/dL (ref 0.3–1.2)
Total Protein: 6.1 g/dL — ABNORMAL LOW (ref 6.5–8.1)

## 2021-12-03 LAB — GLUCOSE, CAPILLARY
Glucose-Capillary: 152 mg/dL — ABNORMAL HIGH (ref 70–99)
Glucose-Capillary: 145 mg/dL — ABNORMAL HIGH (ref 70–99)
Glucose-Capillary: 125 mg/dL — ABNORMAL HIGH (ref 70–99)
Glucose-Capillary: 153 mg/dL — ABNORMAL HIGH (ref 70–99)

## 2021-12-03 LAB — PROCALCITONIN: Procalcitonin: <0.1 ng/mL

## 2021-12-03 LAB — MAGNESIUM: Magnesium: 2.1 mg/dL (ref 1.7–2.4)

## 2021-12-03 LAB — BRAIN NATRIURETIC PEPTIDE: B Natriuretic Peptide: 13.1 pg/mL (ref 0.0–100.0)

## 2021-12-03 NOTE — Progress Notes (Signed)
Occupational Therapy Treatment Patient Details Name: Sean [name] MRN: [id number] DOB: [birth year] Today's Date: 12/03/2021   History of present illness 61 yo admited 5/30 with hip pain and impaired gait. 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 Now s/p R THA on 6/2. PMHx: OA, HTN, DM, obesity, anxiety/depression   OT comments  Pt assisted on and off BSC. Light Mod assist with heavy reliance on UEs to stand from recliner and BSC. Completed grooming with set up in sitting. Continues to be appropriate for SNF level therapy.   Recommendations for follow up therapy are one component of a multi-disciplinary discharge planning process, led by the attending physician.  Recommendations may be updated based on patient status, additional functional criteria and insurance authorization.    Follow Up Recommendations  Skilled nursing-short term rehab (<3 hours/day)    Assistance Recommended at Discharge Frequent or constant Supervision/Assistance  Patient can return home with the following  A little help with walking and/or transfers;A lot of help with bathing/dressing/bathroom;Assistance with cooking/housework;Direct supervision/assist for medications management;Direct supervision/assist for financial management;Assist for transportation;Help with stairs or ramp for entrance   Equipment Recommendations  Other (comment) (defer to next venue)    Recommendations for Other Services      Precautions / Restrictions Precautions Precautions: Fall Precaution Comments: Consider getting a 3 minutes standing BP Restrictions Weight Bearing Restrictions: Yes RLE Weight Bearing: Weight bearing as tolerated       Mobility Bed Mobility               General bed mobility comments: pt in chair    Transfers Overall transfer level: Needs assistance Equipment used: Rolling walker (2 wheels) Transfers: Sit to/from Stand Sit to Stand: Mod assist            General transfer comment: heavy reliance on UEs, assist to rise and steady     Balance Overall balance assessment: Needs assistance   Sitting balance-Leahy Scale: Fair     Standing balance support: Bilateral upper extremity supported, During functional activity Standing balance-Leahy Scale: Poor Standing balance comment: heavy reliance on RW support                           ADL either performed or assessed with clinical judgement   ADL Overall ADL's : Needs assistance/impaired     Grooming: Wash/dry hands;Sitting;Set up                   Toilet Transfer: Minimal assistance;BSC/3in1;Rolling walker (2 wheels)   Toileting- Clothing Manipulation and Hygiene: Minimal assistance;Sitting/lateral lean              Extremity/Trunk Assessment              Vision       Perception     Praxis      Cognition Arousal/Alertness: Awake/alert Behavior During Therapy: WFL for tasks assessed/performed Overall Cognitive Status: Within Functional Limits for tasks assessed                                 General Comments: distractible        Exercises      Shoulder Instructions       General Comments Frequently distracted; Notably less talkative toward end of walk -- will consider getting a 3 min standing BP next session, but pt did  not report dizziness    Pertinent Vitals/ Pain       Pain Assessment Pain Assessment: Faces Faces Pain Scale: Hurts even more Pain Location: R hip Pain Descriptors / Indicators: Grimacing, Guarding Pain Intervention(s): Monitored during session, Repositioned, Premedicated before session, Ice applied  Home Living                                          Prior Functioning/Environment              Frequency  Min 2X/week        Progress Toward Goals  OT Goals(current goals can now be found in the care plan section)  Progress towards OT goals: Progressing toward  goals  Acute Rehab OT Goals OT Goal Formulation: With patient Time For Goal Achievement: 12/13/21 Potential to Achieve Goals: Good  Plan Discharge plan remains appropriate    Co-evaluation                 AM-PAC OT "6 Clicks" Daily Activity     Outcome Measure   Help from another person eating meals?: None Help from another person taking care of personal grooming?: A Little Help from another person toileting, which includes using toliet, bedpan, or urinal?: A Lot Help from another person bathing (including washing, rinsing, drying)?: A Lot Help from another person to put on and taking off regular upper body clothing?: A Little Help from another person to put on and taking off regular lower body clothing?: Total 6 Click Score: 15    End of Session    OT Visit Diagnosis: Unsteadiness on feet (R26.81);Other abnormalities of gait and mobility (R26.89);Muscle weakness (generalized) (M62.81)   Activity Tolerance Patient tolerated treatment well   Patient Left in chair;with call bell/phone within reach;with chair alarm set   Nurse Communication          Time: 1324-4010 OT Time Calculation (min): 14 min  Charges: OT General Charges $OT Visit: 1 Visit OT Treatments $Self Care/Home Management : 8-22 mins  Martie Round, OTR/L Acute Rehabilitation Services Pager: [phone] Office: [phone]   Evern Bio 12/03/2021, 12:09 PM

## 2021-12-03 NOTE — Progress Notes (Signed)
Physical Therapy Treatment Patient Details Name: Sean [name] MRN: [id number] DOB: 05/26/1961 Today's Date: 12/03/2021   History of Present Illness 61 yo admited 5/30 with hip pain and impaired gait. 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 Now s/p R THA on 6/2. PMHx: OA, HTN, DM, obesity, anxiety/depression    PT Comments    Continuing work on functional mobility and activity tolerance;  Session focused on progressive ambulation, and pt able to walk further today than previous session; Light mod assist to come to sit at EOB, Mod assist to stand from elevated bed; Used chair follow to help boost pt's confidence with increasing amb distance, with good success -- noteworthy that pt less talkative and less able to follow cues related to gait sequence and step length towards the end of the walk; consider getting a standing BP read towards the end of his walk next session; Overall progressing well; Anticipate continuing good progress at post-acute rehabilitation.   Recommendations for follow up therapy are one component of a multi-disciplinary discharge planning process, led by the attending physician.  Recommendations may be updated based on patient status, additional functional criteria and insurance authorization.  Follow Up Recommendations  Skilled nursing-short term rehab (<3 hours/day)     Assistance Recommended at Discharge Frequent or constant Supervision/Assistance  Patient can return home with the following A lot of help with walking and/or transfers;A lot of help with bathing/dressing/bathroom;Assist for transportation;Assistance with cooking/housework;Help with stairs or ramp for entrance   Equipment Recommendations  Rolling walker (2 wheels);BSC/3in1    Recommendations for Other Services       Precautions / Restrictions Precautions Precautions: Fall Precaution Comments: Consider getting a 3 minutes standing  BP Restrictions RLE Weight Bearing: Weight bearing as tolerated     Mobility  Bed Mobility Overal bed mobility: Needs Assistance Bed Mobility: Supine to Sit     Supine to sit: Mod assist     General bed mobility comments: Cues for technique; HOB slightly elevated; good start by using LLE to half-bridge hips towards EOB on the R; Assist given to support RLE coming off of edge of bed; Cues to use LUE to reach across body and hold R bedrail to initiate getting trunk up; then mod handheld assist to push up trunk and bring RUE to R side for upright sitting    Transfers Overall transfer level: Needs assistance Equipment used: Rolling walker (2 wheels) Transfers: Sit to/from Stand Sit to Stand: Mod assist, +2 safety/equipment           General transfer comment: Elevated bed height; cues for hand placement, however, pt ultimately stood with both hands on RW, and needing assist to steady RW, as well as light mod assist to help with initiating    Ambulation/Gait Ambulation/Gait assistance: Min guard, Min assist (Second person for chair follow; Pt will likley not walk witout a second person for chair follow) Gait Distance (Feet): 42 Feet Assistive device: Rolling walker (2 wheels) Gait Pattern/deviations: Step-to pattern, Step-through pattern, Decreased stride length, Trunk flexed, Wide base of support (step-through emerging) Gait velocity: reduced     General Gait Details: Adjusted RW height down one notch to allow for more optimal fit; Cues for gait sequence; noting LLE stepping through more; able to increase distance compared to previous session; also noted an increased tremor RUE towards teh end of walk; when asked pt if this was usual for him, did not get a straightforward answer  Stairs             Wheelchair Mobility    Modified Rankin (Stroke Patients Only)       Balance     Sitting balance-Leahy Scale: Fair       Standing balance-Leahy Scale:  Poor Standing balance comment: heavy reliance on RW support                            Cognition Arousal/Alertness: Awake/alert Behavior During Therapy: WFL for tasks assessed/performed (Seems to use humor to cover up anxiety) Overall Cognitive Status: Within Functional Limits for tasks assessed                                 General Comments: pt is distracted by his pain and random thoughts        Exercises      General Comments General comments (skin integrity, edema, etc.): Frequently distracted; Notably less talkative toward end of walk -- will consider getting a 3 min standing BP next session, but pt did not report dizziness      Pertinent Vitals/Pain Pain Assessment Pain Assessment: Faces Faces Pain Scale: Hurts even more Pain Location: R hip Pain Descriptors / Indicators: Grimacing, Guarding Pain Intervention(s): Monitored during session    Home Living                          Prior Function            PT Goals (current goals can now be found in the care plan section) Acute Rehab PT Goals Patient Stated Goal: be able to walk PT Goal Formulation: With patient Time For Goal Achievement: 12/11/21 Potential to Achieve Goals: Fair Progress towards PT goals: Progressing toward goals    Frequency    Min 3X/week      PT Plan Current plan remains appropriate    Co-evaluation              AM-PAC PT "6 Clicks" Mobility   Outcome Measure  Help needed turning from your back to your side while in a flat bed without using bedrails?: A Lot Help needed moving from lying on your back to sitting on the side of a flat bed without using bedrails?: A Lot Help needed moving to and from a bed to a chair (including a wheelchair)?: A Lot Help needed standing up from a chair using your arms (e.g., wheelchair or bedside chair)?: A Lot Help needed to walk in hospital room?: Total Help needed climbing 3-5 steps with a railing? :  Total 6 Click Score: 10    End of Session Equipment Utilized During Treatment: Gait belt Activity Tolerance: Patient tolerated treatment well Patient left: in chair;with call bell/phone within reach;with chair alarm set Nurse Communication: Mobility status PT Visit Diagnosis: Other abnormalities of gait and mobility (R26.89);Difficulty in walking, not elsewhere classified (R26.2);Muscle weakness (generalized) (M62.81);Pain Pain - Right/Left: Right Pain - part of body: Hip     Time: 0930-1005 PT Time Calculation (min) (ACUTE ONLY): 35 min  Charges:  $Gait Training: 8-22 mins $Therapeutic Activity: 8-22 mins                     Van Clines, PT  Acute Rehabilitation Services Office [phone]    Levi Aland 12/03/2021, 10:35 AM

## 2021-12-03 NOTE — Care Management Important Message (Signed)
Important Message  Patient Details  Name: Sean [name] MRN: [id number] Date of Birth: Mar 08, 1961   Medicare Important Message Given:  Yes     Dorena Bodo 12/03/2021, 3:40 PM

## 2021-12-03 NOTE — Progress Notes (Signed)
See above                        PROGRESS NOTE        PATIENT DETAILS Name: Sean NianRobert F Bublitz Age: 61 y.o. Sex: male Date of Birth: 05/26/1961 Admit Date: 11/25/2021 Admitting Physician Marrion Coyekui Zhang, MD NFA:OZHYQMPCP:[facility]  Brief Summary:  Patient is a 61 y.o.  male with history of chronic right>left hip pain-presented with intractable right hip pain (not relieved by oral narcotics)-with significant leukocytosis unable to ambulate-and subsequently admitted to the hospitalist service.   Significant events: 5/30>> presented to ED with intractable right hip pain-unable to ambulate.  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 6/02>> right hip arthroplasty.  Significant studies: 5/30>> x-ray right knee: No acute radiographic abnormality of the right knee. 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  Small complex right hip joint effusion-likely reactive. 5/31>> MRI lumbosacral spine: Bulky L5-S1 disc osteophyte 6/01>> CXR: No PNA  Significant microbiology data: 6/1>> blood cultures: No growth  Procedures: 6/2>>Right total hip arthroplasty, anterior approach (Dr. Linna CapriceSwinteck)  Consults: Orthopedics   Subjective: Patient in bed, appears comfortable, denies any headache, no fever, no chest pain or pressure, no shortness of breath , no abdominal pain. No focal weakness.  Still has some postop right hip discomfort  Objective:  Vitals: Blood pressure 119/74, pulse 81, temperature 98.3 F (36.8 C), temperature source Oral, resp. rate 16, height 5\' 6"  (1.676 m), weight 127 kg, SpO2 94 %.   Exam:  Awake Alert, No new F.N deficits, Normal affect [place].AT,PERRAL Supple Neck, No JVD,   Symmetrical Chest wall movement, Good air movement bilaterally, CTAB RRR,No Gallops, Rubs or new Murmurs,  +ve B.Sounds, Abd Soft, No tenderness,   No Cyanosis, right  hip postop site has minimal postop edema, bandage in place, patient has an ice pack around his hip    Assessment/Plan:  Intractable right hip pain due to avascular necrosis with femoral head collapse: S/p total hip arthroplasty on 6/2-orthopedics following - recommendations weightbearing as tolerated on RLE with walker, ASA 81 mg twice daily for VTE prophylaxis.  Await PT/OT- will require SNFon discharge.  Still has some pain and discomfort in his right hip, likely postop change, minimal tissue edema on exam around the periop site, patient wishes to see the orthopedic surgeon 1 more time.  Before he is discharged have requested Dr. Linna CapriceSwinteck to evaluate him once prior to discharge on 12/03/2021.  He will be seen either today or tomorrow.  Leukocytosis: Clear focus of infection, UA stable, chest x-ray shows mild atelectasis and he is on I-S, postop right hip site appears stable Ortho to reevaluate either on 12/03/2021 or 12/04/2021.  Afebrile.  Hyponatremia: Much improved after hydration.  AKI: Due to dehydration resolved after IV fluids.  Hyperkalemia - Lokelma, resolved.  Postoperative urinary retention.  On Flomax, trial of Foley removal on 12/01/2021, stable post void residuals.  Acute perioperative blood loss anemia: 600 cc blood loss per op note-no indication for transfusion-watch closely.  HTN: Will place on Lopressor and monitor.  ACE inhibitor held due to AKI.  DM-2 (A1c 6.7 on 6/2): Continue SSI-resume metformin on discharge.    Recent Labs    12/02/21 1634 12/02/21 2104 12/03/21 0745  GLUCAP 165* 152* 152*     Anxiety/depression: Stable-continue trazodone/risperidone/Zoloft.  Homeless: Living in a car prior to this  hospitalization-suspect will require SNF on discharge.  Morbid Obesity: Estimated body mass index is 45.19 kg/m as calculated from the following:   Height as of this encounter: 5\' 6"  (1.676 m).   Weight as of this encounter: 127 kg.   Code status:   Code Status:  Full Code   DVT Prophylaxis: SCDs Start: 11/28/21 1824   Family Communication: None at bedside   Disposition Plan:  Status is: Observation The patient will require care spanning > 2 midnights and should be moved to inpatient because: S/p right hip replacement on 6/12-SNF early next week.  Planned Discharge Destination:Skilled nursing facility   Diet: Diet Order             Diet Carb Modified Fluid consistency: Thin; Room service appropriate? Yes; Fluid restriction: 1500 mL Fluid  Diet effective now                     MEDICATIONS: Scheduled Meds:  aspirin  81 mg Oral BID   benztropine  0.5 mg Oral QHS   cephALEXin  500 mg Oral Q8H   docusate sodium  100 mg Oral BID   FLUoxetine  40 mg Oral q morning   insulin aspart  0-9 Units Subcutaneous TID WC   metoprolol tartrate  25 mg Oral BID   pantoprazole  40 mg Oral Q1200   polyethylene glycol  17 g Oral Daily   risperiDONE  1 mg Oral BID   senna  1 tablet Oral BID   tamsulosin  0.4 mg Oral Daily   traZODone  100 mg Oral QHS   Continuous Infusions:  PRN Meds:.acetaminophen, albuterol, alum & mag hydroxide-simeth, bisacodyl, diphenhydrAMINE, HYDROcodone-acetaminophen, morphine injection, [DISCONTINUED] ondansetron **OR** ondansetron (ZOFRAN) IV, traMADol   I have personally reviewed following labs and imaging studies  LABORATORY DATA:  Recent Labs  Lab 11/28/21 0113 11/29/21 0130 11/30/21 0045 12/01/21 0039 12/02/21 0153 12/03/21 0158  WBC 9.5 11.4* 12.2* 14.6* 13.7* 12.8*  HGB 11.0* 9.8* 9.1* 9.1* 8.8* 9.2*  HCT 32.7* 29.1* 26.4* 26.8* 25.9* 27.3*  PLT 282 260 262 268 284 326  MCV 95.1 94.8 94.3 96.1 95.9 94.8  MCH 32.0 31.9 32.5 32.6 32.6 31.9  MCHC 33.6 33.7 34.5 34.0 34.0 33.7  RDW 13.3 12.7 12.9 13.1 13.0 12.5  LYMPHSABS 2.2  --  1.9 2.2 2.1 2.4  MONOABS 1.1*  --  1.5* 1.8* 1.4* 1.4*  EOSABS 0.1  --  0.1 0.4 0.7* 0.6*  BASOSABS 0.0  --  0.0 0.1 0.1 0.1    Recent Labs  Lab 11/28/21 0113  11/29/21 0130 11/30/21 0045 12/01/21 0039 12/02/21 0153 12/02/21 0815 12/03/21 0158 12/03/21 0509  NA 131* 129* 130* 133* 133*  --  133*  --   K 4.5 4.6 4.4 5.2* 4.7  --  4.9  --   CL 98 99 98 102 102  --  99  --   CO2 25 23 22 23 26   --  23  --   GLUCOSE 103* 175* 178* 122* 120*  --  113*  --   BUN 39* 40* 49* 43* 34*  --  28*  --   CREATININE 1.29* 1.39* 1.84* 1.31* 1.14  --  1.06  --   CALCIUM 9.5 8.9 8.8* 9.0 9.2  --  9.1  --   AST  --   --  30 37 35  --  47*  --   ALT  --   --  46* 54* 60*  --  90*  --   ALKPHOS  --   --  47 61 68  --  88  --   BILITOT  --   --  0.4 0.5 0.5  --  0.6  --   ALBUMIN  --   --  2.9* 2.7* 2.6*  --  2.7*  --   MG  --   --  2.3 2.4 2.3  --  2.1  --   CRP  --   --   --   --   --  12.0* 9.8*  --   PROCALCITON  --   --   --   --  0.13  --  <0.10  --   HGBA1C 6.7*  --   --   --   --   --   --   --   BNP  --   --  5.5 14.4 10.6  --   --  13.1      RADIOLOGY STUDIES/RESULTS: DG Chest Port 1 View  Result Date: 12/02/2021 CLINICAL DATA:  Shortness of breath EXAM: PORTABLE CHEST 1 VIEW COMPARISON:  Radiograph December 01, 2021 FINDINGS: The heart size and mediastinal contours are within normal limits. Low lung volumes with bibasilar atelectasis. No visible pleural effusion or pneumothorax. The visualized skeletal structures are unchanged. IMPRESSION: Low lung volumes with bibasilar atelectasis. Electronically Signed   By: Maudry Mayhew M.D.   On: 12/02/2021 10:01     LOS: 6 days   Signature  Susa Raring M.D on 12/03/2021 at 10:08 AM   -  To page go to [email or web address]

## 2021-12-04 DIAGNOSIS — R262 Difficulty in walking, not elsewhere classified: Secondary | ICD-10-CM | POA: Diagnosis not present

## 2021-12-04 LAB — CBC WITH DIFFERENTIAL/PLATELET
Abs Immature Granulocytes: 0.3 10*3/uL — ABNORMAL HIGH (ref 0.00–0.07)
Basophils Absolute: 0.1 10*3/uL (ref 0.0–0.1)
Basophils Relative: 1 %
Eosinophils Absolute: 0.5 10*3/uL (ref 0.0–0.5)
Eosinophils Relative: 4 %
HCT: 25.8 % — ABNORMAL LOW (ref 39.0–52.0)
Hemoglobin: 8.9 g/dL — ABNORMAL LOW (ref 13.0–17.0)
Immature Granulocytes: 2 %
Lymphocytes Relative: 19 %
Lymphs Abs: 2.4 10*3/uL (ref 0.7–4.0)
MCH: 33 pg (ref 26.0–34.0)
MCHC: 34.5 g/dL (ref 30.0–36.0)
MCV: 95.6 fL (ref 80.0–100.0)
Monocytes Absolute: 1.3 10*3/uL — ABNORMAL HIGH (ref 0.1–1.0)
Monocytes Relative: 10 %
Neutro Abs: 8.3 10*3/uL — ABNORMAL HIGH (ref 1.7–7.7)
Neutrophils Relative %: 64 %
Platelets: 337 10*3/uL (ref 150–400)
RBC: 2.7 MIL/uL — ABNORMAL LOW (ref 4.22–5.81)
RDW: 12.6 % (ref 11.5–15.5)
WBC: 12.9 10*3/uL — ABNORMAL HIGH (ref 4.0–10.5)
nRBC: 0 % (ref 0.0–0.2)

## 2021-12-04 LAB — COMPREHENSIVE METABOLIC PANEL
ALT: 98 U/L — ABNORMAL HIGH (ref 0–44)
AST: 48 U/L — ABNORMAL HIGH (ref 15–41)
Albumin: 2.7 g/dL — ABNORMAL LOW (ref 3.5–5.0)
Alkaline Phosphatase: 100 U/L (ref 38–126)
Anion gap: 6 (ref 5–15)
BUN: 25 mg/dL — ABNORMAL HIGH (ref 6–20)
CO2: 26 mmol/L (ref 22–32)
Calcium: 9.4 mg/dL (ref 8.9–10.3)
Chloride: 101 mmol/L (ref 98–111)
Creatinine, Ser: 1.19 mg/dL (ref 0.61–1.24)
GFR, Estimated: >60 mL/min (ref >60)
Glucose, Bld: 121 mg/dL — ABNORMAL HIGH (ref 70–99)
Potassium: 4.7 mmol/L (ref 3.5–5.1)
Sodium: 133 mmol/L — ABNORMAL LOW (ref 135–145)
Total Bilirubin: 0.5 mg/dL (ref 0.3–1.2)
Total Protein: 6.7 g/dL (ref 6.5–8.1)

## 2021-12-04 LAB — GLUCOSE, CAPILLARY
Glucose-Capillary: 147 mg/dL — ABNORMAL HIGH (ref 70–99)
Glucose-Capillary: 112 mg/dL — ABNORMAL HIGH (ref 70–99)

## 2021-12-04 LAB — C-REACTIVE PROTEIN: CRP: 7.8 mg/dL — ABNORMAL HIGH (ref <1.0)

## 2021-12-04 LAB — PROCALCITONIN: Procalcitonin: 0.1 ng/mL

## 2021-12-04 MED ORDER — DOCUSATE SODIUM 100 MG PO CAPS: 200.0000 mg | ORAL_CAPSULE | Freq: Two times a day (BID) | ORAL | 0 refills | Status: DC | PRN

## 2021-12-04 MED ORDER — INSULIN ASPART 100 UNIT/ML FLEXPEN: PEN_INJECTOR | SUBCUTANEOUS | 0 refills | Status: DC

## 2021-12-04 MED ORDER — METOPROLOL TARTRATE 25 MG PO TABS: 25.0000 mg | ORAL_TABLET | Freq: Two times a day (BID) | ORAL | Status: DC

## 2021-12-04 MED ORDER — TAMSULOSIN HCL 0.4 MG PO CAPS: 0.4000 mg | ORAL_CAPSULE | Freq: Every day | ORAL | Status: AC

## 2021-12-04 NOTE — Progress Notes (Signed)
Physical Therapy Treatment Patient Details Name: Sean [name] MRN: [id number] DOB: [birth year] Today's Date: 12/04/2021   History of Present Illness 61 yo admited 5/30 with hip pain and impaired gait. 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 Now s/p R THA on 6/2. PMHx: OA, HTN, DM, obesity, anxiety/depression    PT Comments    Pt making steady progress with mobility. Continue to recommend ST-SNF at dc for further rehab prior to return home.    Recommendations for follow up therapy are one component of a multi-disciplinary discharge planning process, led by the attending physician.  Recommendations may be updated based on patient status, additional functional criteria and insurance authorization.  Follow Up Recommendations  Skilled nursing-short term rehab (<3 hours/day)     Assistance Recommended at Discharge Frequent or constant Supervision/Assistance  Patient can return home with the following A lot of help with walking and/or transfers;A lot of help with bathing/dressing/bathroom;Assist for transportation;Assistance with cooking/housework;Help with stairs or ramp for entrance   Equipment Recommendations  Rolling walker (2 wheels);BSC/3in1    Recommendations for Other Services       Precautions / Restrictions Precautions Precautions: Fall Restrictions Weight Bearing Restrictions: Yes RLE Weight Bearing: Weight bearing as tolerated     Mobility  Bed Mobility Overal bed mobility: Needs Assistance Bed Mobility: Supine to Sit     Supine to sit: Min assist     General bed mobility comments: Assist for pt to pull his trunk up    Transfers Overall transfer level: Needs assistance Equipment used: Rolling walker (2 wheels) Transfers: Sit to/from Stand Sit to Stand: Min assist, From elevated surface, +2 physical assistance           General transfer comment: Assist to bring hips up     Ambulation/Gait Ambulation/Gait assistance: Min guard, Min assist (Second person for chair follow; Pt will likley not walk witout a second person for chair follow) Gait Distance (Feet): 45 Feet Assistive device: Rolling walker (2 wheels) Gait Pattern/deviations: Step-to pattern, Step-through pattern, Decreased stride length, Trunk flexed, Wide base of support Gait velocity: decr Gait velocity interpretation: <1.31 ft/sec, indicative of household ambulator   General Gait Details: Assist for balance and safety. Pt with 2 standing rest breaks   Stairs             Wheelchair Mobility    Modified Rankin (Stroke Patients Only)       Balance Overall balance assessment: Needs assistance Sitting-balance support: Bilateral upper extremity supported, Feet supported Sitting balance-Leahy Scale: Fair     Standing balance support: Bilateral upper extremity supported Standing balance-Leahy Scale: Poor Standing balance comment: walker and min guard for static standing                            Cognition Arousal/Alertness: Awake/alert Behavior During Therapy: WFL for tasks assessed/performed Overall Cognitive Status: Within Functional Limits for tasks assessed                                 General Comments: distractible        Exercises Other Exercises Other Exercises: standing knee flex/ext on rt x 5 reps    General Comments        Pertinent Vitals/Pain Pain Assessment Pain Assessment: Faces Faces Pain Scale: Hurts even more Pain Location: R hip and groin Pain Descriptors /  Indicators: Grimacing, Guarding, Burning Pain Intervention(s): Monitored during session, Repositioned    Home Living                          Prior Function            PT Goals (current goals can now be found in the care plan section) Acute Rehab PT Goals Patient Stated Goal: be able to walk Progress towards PT goals: Progressing toward goals     Frequency    Min 3X/week      PT Plan Current plan remains appropriate    Co-evaluation              AM-PAC PT "6 Clicks" Mobility   Outcome Measure  Help needed turning from your back to your side while in a flat bed without using bedrails?: A Lot Help needed moving from lying on your back to sitting on the side of a flat bed without using bedrails?: A Lot Help needed moving to and from a bed to a chair (including a wheelchair)?: A Lot Help needed standing up from a chair using your arms (e.g., wheelchair or bedside chair)?: A Lot Help needed to walk in hospital room?: A Little Help needed climbing 3-5 steps with a railing? : Total 6 Click Score: 12    End of Session Equipment Utilized During Treatment: Gait belt Activity Tolerance: Patient tolerated treatment well Patient left: in chair;with call bell/phone within reach;with chair alarm set   PT Visit Diagnosis: Other abnormalities of gait and mobility (R26.89);Difficulty in walking, not elsewhere classified (R26.2);Muscle weakness (generalized) (M62.81);Pain Pain - Right/Left: Right Pain - part of body: Hip     Time: 3244-0102 PT Time Calculation (min) (ACUTE ONLY): 20 min  Charges:  $Gait Training: 8-22 mins                     [facility] PT Acute Rehabilitation Services Office [phone]    Angelina Ok [facility] 12/04/2021, 1:03 PM

## 2021-12-04 NOTE — Discharge Summary (Signed)
Sean [name]:[id number] DOB: 1961-05-26 DOA: 11/25/2021  PCP: [facility]  Admit date: 11/25/2021  Discharge date: 12/04/2021  Admitted From: Home   Disposition:  SNF   Recommendations for Outpatient Follow-up:   Follow up with PCP in 1-2 weeks  PCP Please obtain BMP/CBC, 2 view CXR in 1week,  (see Discharge instructions)   PCP Please follow up on the following pending results: CBC, CMP, magnesium in 7 to 10 days, follow-up with orthopedics within a week of discharge.   Home Health: None   Equipment/Devices: None  Consultations: Orthopedics Discharge Condition: Stable    CODE STATUS: Full    Diet Recommendation: Heart Healthy Low Carb    Chief Complaint  Patient presents with   Knee Pain     Brief history of present illness from the day of admission and additional interim summary    61 y.o.  male with history of chronic right>left hip pain-presented with intractable right hip pain (not relieved by oral narcotics)-with significant leukocytosis unable to ambulate-and subsequently admitted to the hospitalist service.     Significant events: 5/30>> presented to ED with intractable right hip pain-unable to ambulate.  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 6/02>> right hip arthroplasty.   Significant studies: 5/30>> x-ray right knee: No acute radiographic abnormality of the right knee. 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  Small complex right hip joint effusion-likely reactive. 5/31>> MRI lumbosacral spine: Bulky L5-S1 disc osteophyte 6/01>> CXR: No PNA   Significant microbiology data: 6/1>> blood cultures: No growth   Procedures: 6/2>>Right  total hip arthroplasty, anterior approach (Dr. Linna Caprice)                                                                   Hospital Course   Intractable right hip pain due to avascular necrosis with femoral head collapse: S/p total hip arthroplasty on 6/2-orthopedics following - recommendations weightbearing as tolerated on RLE with walker, ASA 81 mg twice daily for VTE prophylaxis.  Await PT/OT- will require SNFon discharge.  Much improved postop discomfort, be discharged to SNF with outpatient follow-up with Dr. Linna Caprice within a week of discharge.   Leukocytosis: Clear focus of infection, UA stable, chest x-ray shows mild atelectasis and he is on I-S, postop right hip site appears stable gust with orthopedics Dr. Linna Caprice on 12/03/2021.  Outpatient follow-up.   Hyponatremia: Due to dehydration, much improved after hydration.   AKI: Due to dehydration resolved after IV fluids.   Hyperkalemia - Lokelma, resolved.   Postoperative urinary retention.  On Flomax, trial of Foley removal on 12/01/2021, stable post void residuals.   Acute perioperative blood loss anemia: 600 cc blood loss  per op note-no indication for transfusion-CBC in 7 to 10 days at SNF.  HTN: Will place on Lopressor and monitor. ACE inhibitor held due to AKI.  Anxiety/depression: Stable-continue trazodone/risperidone/Zoloft.   Homeless: Living in a car prior to this hospitalization-suspect will require SNF on discharge.   Morbid Obesity: Estimated body mass index is 45.19, follow with PCP for weight loss.  DM-2 (A1c 6.7 on 6/2): Continue SSI-resume metformin on discharge.    Discharge diagnosis     Principal Problem:   Impaired ambulation Active Problems:   Avascular necrosis of bone of hip, right (HCC)   Controlled type 2 diabetes mellitus without complication, without long-term current use of insulin (HCC)   HTN (hypertension)   Obesity, Class III, BMI 40-49.9 (morbid obesity) (HCC)    Discharge instructions     Discharge Instructions     Diet - low sodium heart healthy   Complete by: As directed    Discharge wound care:   Complete by: As directed    Please keep right hip postop site clean and dry must follow-up with orthopedics within a week of discharge.   Increase activity slowly   Complete by: As directed        Discharge Medications   Allergies as of 12/04/2021   No Known Allergies      Medication List     STOP taking these medications    amoxicillin-clavulanate 875-125 MG tablet Commonly known as: AUGMENTIN   diclofenac 50 MG EC tablet Commonly known as: VOLTAREN   lisinopril-hydrochlorothiazide 20-25 MG tablet Commonly known as: ZESTORETIC   meloxicam 15 MG tablet Commonly known as: MOBIC   oxyCODONE-acetaminophen 5-325 MG tablet Commonly known as: PERCOCET/ROXICET       TAKE these medications    aspirin 81 MG chewable tablet Commonly known as: Aspirin Childrens Chew 1 tablet (81 mg total) by mouth 2 (two) times daily with a meal.   benztropine 0.5 MG tablet Commonly known as: COGENTIN Take 0.5-1 mg by mouth at bedtime.   docusate sodium 100 MG capsule Commonly known as: COLACE Take 2 capsules (200 mg total) by mouth 2 (two) times daily as needed for mild constipation.   FLUoxetine 40 MG capsule Commonly known as: PROZAC Take 40 mg by mouth every morning.   gabapentin 400 MG capsule Commonly known as: NEURONTIN Take 400 mg by mouth daily.   HYDROcodone-acetaminophen 10-325 MG tablet Commonly known as: Norco Take 0.5 tablets by mouth every 4 (four) hours as needed for up to 7 days for moderate pain or severe pain.   ICY HOT EX Apply 1 application. topically 2 (two) times daily as needed (pain).   insulin aspart 100 UNIT/ML FlexPen Commonly known as: NOVOLOG Before each meal 3 times a day, 140-199 - 2 units, 200-250 - 4 units, 251-299 - 6 units,  300-349 - 8 units,  350 or above 10 units. Insulin PEN if approved, provide syringes and needles if  needed.   metFORMIN 500 MG tablet Commonly known as: GLUCOPHAGE Take 500 mg by mouth See admin instructions. Take 500 mg by mouth 1-2 times a day   methocarbamol 500 MG tablet Commonly known as: ROBAXIN Take 1 tablet (500 mg total) by mouth 2 (two) times daily.   methylPREDNISolone 4 MG Tbpk tablet Commonly known as: MEDROL DOSEPAK Use as directed on the package   metoprolol tartrate 25 MG tablet Commonly known as: LOPRESSOR Take 1 tablet (25 mg total) by mouth 2 (two) times daily.   omeprazole 20 MG capsule  Commonly known as: PRILOSEC Take 20 mg by mouth daily.   risperiDONE 1 MG tablet Commonly known as: RISPERDAL Take 1 mg by mouth See admin instructions. 1 mg by mouth 1-2 times a day   tamsulosin 0.4 MG Caps capsule Commonly known as: FLOMAX Take 1 capsule (0.4 mg total) by mouth daily.   traZODone 100 MG tablet Commonly known as: DESYREL Take 100 mg by mouth at bedtime.   TYLENOL PO Take 1 tablet by mouth 2 (two) times daily as needed (pain).   Vitamin D (Ergocalciferol) 1.25 MG (50000 UNIT) Caps capsule Commonly known as: DRISDOL Take 50,000 Units by mouth once a week.               Discharge Care Instructions  (From admission, onward)           Start     Ordered   12/04/21 0000  Discharge wound care:       Comments: Please keep right hip postop site clean and dry must follow-up with orthopedics within a week of discharge.   12/04/21 4098             Follow-up Information     Swinteck, Arlys John, MD Follow up in 1 week(s).   Specialty: Orthopedic Surgery Why: For wound re-check, For suture removal Contact information: [address] [place] [address] [phone]         [facility]. Schedule an appointment as soon as possible for a visit in 1 week(s).   Contact information: [address] [place] [address] [phone]                 Major procedures and Radiology Reports - PLEASE  review detailed and final reports thoroughly  -       DG Chest Port 1 View  Result Date: 12/02/2021 CLINICAL DATA:  Shortness of breath EXAM: PORTABLE CHEST 1 VIEW COMPARISON:  Radiograph December 01, 2021 FINDINGS: The heart size and mediastinal contours are within normal limits. Low lung volumes with bibasilar atelectasis. No visible pleural effusion or pneumothorax. The visualized skeletal structures are unchanged. IMPRESSION: Low lung volumes with bibasilar atelectasis. Electronically Signed   By: Maudry Mayhew M.D.   On: 12/02/2021 10:01   DG Chest Port 1 View  Result Date: 12/01/2021 CLINICAL DATA:  Shortness of breath EXAM: PORTABLE CHEST 1 VIEW COMPARISON:  November 27, 2021 FINDINGS: The heart size and mediastinal contours are within normal limits. Mild linear atelectasis or scar is identified in bilateral lung bases. There is no focal infiltrate, pulmonary edema, or pleural effusion. The visualized skeletal structures are stable. IMPRESSION: No active disease. Electronically Signed   By: Sherian Rein M.D.   On: 12/01/2021 07:37   US RENAL  Result Date: 11/30/2021 CLINICAL DATA:  61 year old male with acute kidney injury. EXAM: RENAL / URINARY TRACT ULTRASOUND COMPLETE COMPARISON:  None Available. FINDINGS: Right Kidney: Renal measurements: 11.6 x 5.4 x 5.9 cm = volume: 194 mL. Cortical thinning noted. Normal renal echogenicity identified without hydronephrosis or renal mass. Left Kidney: Renal measurements: 11.2 x 5.8 x 5.7 cm = volume: 192 mL. Cortical thinning noted. Normal renal echogenicity identified without hydronephrosis or renal mass. Bladder: Foley catheter is present within a collapsed bladder. Other: None. IMPRESSION: 1. Bilateral renal cortical thinning with normal renal echogenicity. No hydronephrosis or renal mass. 2. Foley catheter within a collapsed bladder. Electronically Signed   By: Harmon Pier M.D.   On: 11/30/2021 15:06   DG Pelvis  Portable  Result Date: 11/28/2021 CLINICAL DATA:   Postop for total hip arthroplasty. EXAM: PORTABLE PELVIS 1-2 VIEWS COMPARISON:  CT 11/28/2021 FINDINGS: Right hip arthroplasty. No hardware complication or acute periprosthetic fracture. Sacroiliac joints are symmetric. IMPRESSION: Expected appearance after right hip arthroplasty. Electronically Signed   By: Jeronimo GreavesKyle  Talbot M.D.   On: 11/28/2021 18:13   DG HIP UNILAT WITH PELVIS 1V RIGHT  Result Date: 11/28/2021 CLINICAL DATA:  Right hip surgery. EXAM: DG HIP (WITH OR WITHOUT PELVIS) 1V RIGHT COMPARISON:  Preoperative imaging. FINDINGS: Four fluoroscopic spot views of the right hip and pelvis in frontal projection. Advanced right hip arthropathy with subsequent total hip arthroplasty. Fluoroscopy time 16 seconds. Dose 3.5 mGy IMPRESSION: Intraoperative fluoroscopy during right total hip arthroplasty. Electronically Signed   By: Narda RutherfordMelanie  Sanford M.D.   On: 11/28/2021 17:21   DG C-Arm 1-60 Min-No Report  Result Date: 11/28/2021 Fluoroscopy was utilized by the requesting physician.  No radiographic interpretation.   DG C-Arm 1-60 Min-No Report  Result Date: 11/28/2021 Fluoroscopy was utilized by the requesting physician.  No radiographic interpretation.   CT HIP RIGHT WO CONTRAST  Result Date: 11/28/2021 CLINICAL DATA:  Severe right hip pain for the past 6 weeks. EXAM: CT OF THE RIGHT HIP WITHOUT CONTRAST TECHNIQUE: Multidetector CT imaging of the right hip was performed according to the standard protocol. Multiplanar CT image reconstructions were also generated. RADIATION DOSE REDUCTION: This exam was performed according to the departmental dose-optimization program which includes automated exposure control, adjustment of the mA and/or kV according to patient size and/or use of iterative reconstruction technique. COMPARISON:  Right hip x-rays from yesterday. MRI right hip dated Nov 26, 2021. FINDINGS: Bones/Joint/Cartilage Right femoral head subchondral fracture and prominent flattening again noted. Prominent  subchondral sclerosis and cystic change in the right femoral head and acetabulum with superior migration of the right femur. Marginal osteophytes. No dislocation. Small complex joint effusion. Ligaments Ligaments are suboptimally evaluated by CT. Muscles and Tendons Grossly intact. Soft tissue No fluid collection or hematoma. No soft tissue mass. Small fat containing right inguinal hernia. IMPRESSION: 1. Similar findings consistent with advanced right femoral head avascular necrosis with secondary end-stage right hip osteoarthritis. Electronically Signed   By: Obie DredgeWilliam T Derry M.D.   On: 11/28/2021 08:29   DG HIP UNILAT WITH PELVIS 2-3 VIEWS RIGHT  Result Date: 11/27/2021 CLINICAL DATA:  Right hip pain. EXAM: DG HIP (WITH OR WITHOUT PELVIS) 2-3V RIGHT COMPARISON:  MRI 11/26/2021 FINDINGS: Chronic AVN with marked flattening of the right femoral head. Associated severe secondary a hip joint degenerative changes with severe joint space narrowing, bony eburnation, subchondral cystic change and probable fragmentation. Suspect remote posttraumatic changes involving the right ischium. The left hip is intact. The SI joints are intact. IMPRESSION: 1. 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 2. Severe right hip joint degenerative changes. Electronically Signed   By: Rudie MeyerP.  Gallerani M.D.   On: 11/27/2021 12:21   DG Chest 1 View  Result Date: 11/27/2021 CLINICAL DATA:  Right hip pain EXAM: CHEST  1 VIEW COMPARISON:  04/12/2008 FINDINGS: Cardiac size is within normal limits. Thoracic aorta is tortuous and ectatic. Lung fields are clear of any infiltrates or pulmonary edema. There is no significant pleural effusion or pneumothorax. There are small linear densities in the right lower lung fields. IMPRESSION: There are no signs of pulmonary edema or focal pulmonary consolidation. Linear densities in the right lower lung fields may suggest scarring. Electronically Signed   By: Rhae HammockPalani  Rathinasamy M.D.   On:  11/27/2021 12:17   MR HIP RIGHT WO CONTRAST  Result Date: 11/26/2021 CLINICAL DATA:  Right leg pain for 6 months EXAM: MR OF THE RIGHT HIP WITHOUT CONTRAST TECHNIQUE: Multiplanar, multisequence MR imaging was performed. No intravenous contrast was administered. COMPARISON:  X-ray 02/03/2010 FINDINGS: Technical note: Incomplete examination. Patient was unable to tolerate further imaging. Best possible images were obtained and submitted for interpretation. 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 There is marked flattening of the femoral head and remodeling of the acetabulum. Numerous subchondral cysts within the acetabulum. Extensive bone marrow edema within the residual femoral head and extending into the femoral neck. Reactive bone marrow edema throughout the right acetabulum. Small complex right hip joint effusion, likely reactive. No dislocation. Small focus of avascular necrosis involving the left femoral head. No associated subchondral fracture or subchondral collapse. Left femoral head contour is maintained. Mild osteoarthritis of the left hip. Tearing of the superior left labrum. Lobulated 4.3 x 2.1 x 2.9 cm cyst along the posterior margin of the left acetabulum, which could represent a paralabral cyst or ganglion. Bony pelvis intact. Mild arthropathy of the pubic symphysis. No suspicious marrow replacing bone lesion. Ligaments Grossly intact. Muscles and Tendons No acute musculotendinous abnormality on limited exam. Small volume right peritrochanteric bursal fluid. Soft tissues Fat containing right inguinal hernia. No mass or fluid collection. Mild prostatomegaly. IMPRESSION: 1. Incomplete examination. See above. 2. Findings of advanced avascular necrosis of the right hip with marked flattening of the femoral head and remodeling  of the acetabulum. Large subchondral fracture of the right femoral head superiorly. Extensive bone marrow edema within the femoral head and neck as well as within the right acetabulum. Small complex right hip joint effusion, likely reactive. 3. Small focus of avascular necrosis involving the left femoral head without associated subchondral fracture or subchondral collapse. 4. Mild osteoarthritis of the left hip. Tearing of the superior left labrum. Lobulated 4.3 x 2.1 x 2.9 cm cyst along the posterior margin of the left acetabulum, which could represent a paralabral cyst or ganglion. 5. Mild prostatomegaly. Electronically Signed   By: Duanne Guess D.O.   On: 11/26/2021 08:15   MR LUMBAR SPINE WO CONTRAST  Result Date: 11/26/2021 CLINICAL DATA:  61 year old male with low back pain radiating to the right leg. Weakness. Difficulty ambulating. EXAM: MRI LUMBAR SPINE WITHOUT CONTRAST TECHNIQUE: Multiplanar, multisequence MR imaging of the lumbar spine was performed. No intravenous contrast was administered. COMPARISON:  Thoracolumbar radiographs 02/03/2010. FINDINGS: Segmentation:  Normal on the comparison radiographs. Alignment: Relatively stable lumbar lordosis since 2011. No significant spondylolisthesis. Vertebrae: No marrow edema or evidence of acute osseous abnormality. Visualized bone marrow signal is within normal limits. Intact visible sacrum and SI joints. Conus medullaris and cauda equina: Conus extends to the L1 level. No lower spinal cord or conus signal abnormality. Paraspinal and other soft tissues: Conspicuous asymmetry of the psoas musculature (series 9, image 24) which appears hypertrophied on the left, smaller but not definitely atrophied on the right. The posterior paraspinal muscles appear more symmetric. Negative visible abdominal viscera. No paraspinal soft tissue inflammation identified. Disc levels: T11-T12: Negative. T12-L1:  Subtle disc bulging.  No stenosis. L1-L2: Mild disc space loss  with circumferential disc bulging. Mild posterior element hypertrophy. No spinal or lateral recess stenosis. Mild L1 neural foraminal stenosis greater on  the right. L2-L3: Negative disc. Mild to moderate facet hypertrophy. No significant stenosis. L3-L4: Mild disc space loss and circumferential disc bulge. Mild to moderate facet and ligament flavum hypertrophy. No spinal or lateral recess stenosis. Mild L3 foraminal stenosis mostly on the left. L4-L5: Mild circumferential disc bulge. Small posterior annular fissure in the midline. Moderate facet and ligament flavum hypertrophy. Degenerative facet joint fluid. Epidural lipomatosis. Borderline to mild spinal stenosis. No lateral recess stenosis. Mild to moderate bilateral L4 neural foraminal stenosis appears fairly symmetric. L5-S1: Disc desiccation. Circumferential disc bulge and endplate spurring. Broad-based posterior and biforaminal involvement. Moderate facet hypertrophy. No spinal or lateral recess stenosis. But moderate to severe bilateral L5 foraminal stenosis, perhaps greater on the left. IMPRESSION: 1. Bulky L5-S1 circumferential disc osteophyte complex and moderate facet degeneration. No spinal or lateral recess stenosis but moderate to severe bilateral neural foraminal stenosis. Query L5 radiculitis. 2. L4-L5 mild disc degeneration but moderate bilateral facet arthropathy. Borderline to mild multifactorial spinal stenosis and mild to moderate bilateral L4 foraminal stenosis. 3. Elsewhere generally mild for age lumbar disc degeneration. No acute osseous abnormality in the lumbosacral spine. Electronically Signed   By: Odessa Fleming M.D.   On: 11/26/2021 05:18   DG Knee Complete 4 Views Right  Result Date: 11/25/2021 CLINICAL DATA:  61 year old male with history of chronic right knee pain. EXAM: RIGHT KNEE - COMPLETE 4+ VIEW COMPARISON:  No priors. FINDINGS: Four views of the right knee demonstrate no acute displaced fracture, subluxation or dislocation. There  is joint space narrowing, subchondral sclerosis, subchondral cyst formation and osteophyte formation in a tricompartmental distribution, indicative of osteoarthritis. IMPRESSION: 1. No acute radiographic abnormality of the right knee. 2. Tricompartmental osteoarthritis, as above. Electronically Signed   By: Trudie Reed M.D.   On: 11/25/2021 07:28   VAS [place] LOWER EXTREMITY VENOUS (DVT) (7a-7p)  Result Date: 11/23/2021  Lower Venous DVT Study Patient Name:  Sean [name]  Date of Exam:   11/22/2021 Medical Rec #: [id number]      Accession #:    [id number] Date of Birth: Mar 25, 1961       Patient Gender: M Patient Age:   61 years Exam Location:  [facility] Procedure:      VAS [place] LOWER EXTREMITY VENOUS (DVT) Referring Phys: Barrie Dunker --------------------------------------------------------------------------------  Indications: Pain bilateral legs x 6-8 months.  Comparison Study: No prior studies. Performing Technologist: Jean Rosenthal RDMS, RVT  Examination Guidelines: A complete evaluation includes B-mode imaging, spectral Doppler, color Doppler, and power Doppler as needed of all accessible portions of each vessel. Bilateral testing is considered an integral part of a complete examination. Limited examinations for reoccurring indications may be performed as noted. The reflux portion of the exam is performed with the patient in reverse Trendelenburg.  +---------+---------------+---------+-----------+----------+--------------+ RIGHT    CompressibilityPhasicitySpontaneityPropertiesThrombus Aging +---------+---------------+---------+-----------+----------+--------------+ CFV      Full           Yes      Yes                                 +---------+---------------+---------+-----------+----------+--------------+ SFJ      Full                                                        +---------+---------------+---------+-----------+----------+--------------+  FV Prox  Full                                                         +---------+---------------+---------+-----------+----------+--------------+ FV Mid   Full                                                        +---------+---------------+---------+-----------+----------+--------------+ FV DistalFull                                                        +---------+---------------+---------+-----------+----------+--------------+ PFV      Full                                                        +---------+---------------+---------+-----------+----------+--------------+ POP      Full           Yes      Yes                                 +---------+---------------+---------+-----------+----------+--------------+ PTV      Full                                                        +---------+---------------+---------+-----------+----------+--------------+ PERO     Full                                                        +---------+---------------+---------+-----------+----------+--------------+ Gastroc  Full                                                        +---------+---------------+---------+-----------+----------+--------------+   +---------+---------------+---------+-----------+----------+--------------+ LEFT     CompressibilityPhasicitySpontaneityPropertiesThrombus Aging +---------+---------------+---------+-----------+----------+--------------+ CFV      Full           Yes      Yes                                 +---------+---------------+---------+-----------+----------+--------------+ SFJ      Full                                                        +---------+---------------+---------+-----------+----------+--------------+  FV Prox  Full                                                        +---------+---------------+---------+-----------+----------+--------------+ FV Mid   Full                                                         +---------+---------------+---------+-----------+----------+--------------+ FV DistalFull                                                        +---------+---------------+---------+-----------+----------+--------------+ PFV      Full                                                        +---------+---------------+---------+-----------+----------+--------------+ POP      Full           Yes      Yes                                 +---------+---------------+---------+-----------+----------+--------------+ PTV      Full                                                        +---------+---------------+---------+-----------+----------+--------------+ PERO     Full                                                        +---------+---------------+---------+-----------+----------+--------------+ Gastroc  Full                                                        +---------+---------------+---------+-----------+----------+--------------+     Summary: RIGHT: - There is no evidence of deep vein thrombosis in the lower extremity.  - No cystic structure found in the popliteal fossa.  LEFT: - There is no evidence of deep vein thrombosis in the lower extremity.  - No cystic structure found in the popliteal fossa.  *See table(s) above for measurements and observations. Electronically signed by Lemar Livings MD on 11/23/2021 at 4:25:06 PM.    Final       Today   Subjective    Richard Miu today has no headache,no chest abdominal pain,no new weakness tingling or numbness, feels much better wants to go home today.  Objective   Blood pressure 112/67, pulse 82, temperature 97.8 F (36.6 C), temperature source Oral, resp. rate 17, height 5\' 6"  (1.676 m), weight 127 kg, SpO2 94 %.   Intake/Output Summary (Last 24 hours) at 12/04/2021 0905 Last data filed at 12/04/2021 02/03/2022 Gross per 24 hour  Intake 420 ml  Output 1325 ml  Net -905 ml    Exam  Awake Alert, No new F.N  deficits,    [place].AT,PERRAL Supple Neck,   Symmetrical Chest wall movement, Good air movement bilaterally, CTAB RRR,No Gallops,   +ve B.Sounds, Abd Soft, Non tender,  No Cyanosis, right hip postop surgical site appears stable   Data Review   Recent Labs  Lab 11/30/21 0045 12/01/21 0039 12/02/21 0153 12/03/21 0158 12/04/21 0422  WBC 12.2* 14.6* 13.7* 12.8* 12.9*  HGB 9.1* 9.1* 8.8* 9.2* 8.9*  HCT 26.4* 26.8* 25.9* 27.3* 25.8*  PLT 262 268 284 326 337  MCV 94.3 96.1 95.9 94.8 95.6  MCH 32.5 32.6 32.6 31.9 33.0  MCHC 34.5 34.0 34.0 33.7 34.5  RDW 12.9 13.1 13.0 12.5 12.6  LYMPHSABS 1.9 2.2 2.1 2.4 2.4  MONOABS 1.5* 1.8* 1.4* 1.4* 1.3*  EOSABS 0.1 0.4 0.7* 0.6* 0.5  BASOSABS 0.0 0.1 0.1 0.1 0.1    Recent Labs  Lab 11/28/21 0113 11/29/21 0130 11/30/21 0045 12/01/21 0039 12/02/21 0153 12/02/21 0815 12/03/21 0158 12/03/21 0509 12/04/21 0422  NA 131*   < > 130* 133* 133*  --  133*  --  133*  K 4.5   < > 4.4 5.2* 4.7  --  4.9  --  4.7  CL 98   < > 98 102 102  --  99  --  101  CO2 25   < > 22 23 26   --  23  --  26  GLUCOSE 103*   < > 178* 122* 120*  --  113*  --  121*  BUN 39*   < > 49* 43* 34*  --  28*  --  25*  CREATININE 1.29*   < > 1.84* 1.31* 1.14  --  1.06  --  1.19  CALCIUM 9.5   < > 8.8* 9.0 9.2  --  9.1  --  9.4  AST  --   --  30 37 35  --  47*  --  48*  ALT  --   --  46* 54* 60*  --  90*  --  98*  ALKPHOS  --   --  47 61 68  --  88  --  100  BILITOT  --   --  0.4 0.5 0.5  --  0.6  --  0.5  ALBUMIN  --   --  2.9* 2.7* 2.6*  --  2.7*  --  2.7*  MG  --   --  2.3 2.4 2.3  --  2.1  --   --   CRP  --   --   --   --   --  12.0* 9.8*  --  7.8*  PROCALCITON  --   --   --   --  0.13  --  <0.10  --  0.10  HGBA1C 6.7*  --   --   --   --   --   --   --   --   BNP  --   --  5.5 14.4 10.6  --   --  13.1  --    < > = values  in this interval not displayed.    Total Time in preparing paper work, data evaluation and todays exam - 35 minutes  Susa Raring M.D on 12/04/2021  at 9:05 AM  [facility]

## 2021-12-04 NOTE — TOC Progression Note (Signed)
Transition of Care [facility]) - Progression Note    Patient Details  Name: Sean [name] MRN: [id number] Date of Birth: Oct 04, 1960  Transition of Care [facility]) CM/SW Contact  Mearl Latin, LCSW Phone Number: 12/04/2021, 9:32 AM  Clinical Narrative:    Patient in agreement with [facility]. They request patient arrive there at 11:30am to have time to complete admission paperwork. CSW will coordinate with RN and arrange PTAR.    Expected Discharge Plan: Skilled Nursing Facility Barriers to Discharge: Continued Medical Work up, SNF Pending bed offer  Expected Discharge Plan and Services Expected Discharge Plan: Skilled Nursing Facility In-house Referral: Clinical Social Work   Post Acute Care Choice: Skilled Nursing Facility Living arrangements for the past 2 months: [facility] [employer]) Expected Discharge Date: 12/04/21                                     Social Determinants of Health (SDOH) Interventions    Readmission Risk Interventions     No data to display

## 2021-12-04 NOTE — TOC Transition Note (Signed)
Transition of Care [facility]) - CM/SW Discharge Note   Patient Details  Name: Sean [name] MRN: [id number] Date of Birth: 1960/07/16  Transition of Care [facility]) CM/SW Contact:  Mearl Latin, LCSW Phone Number: 12/04/2021, 9:33 AM   Clinical Narrative:    Patient will DC to: [facility]  Anticipated DC date: 12/04/21 Family notified: N/A Transport by: Sharin Mons 11:30am   Per MD patient ready for DC to [facility]. RN to call report prior to discharge [phone], room 3206). RN, patient, patient's family, and facility notified of DC. Discharge Summary and FL2 sent to facility. DC packet on chart with signed scripts. Ambulance transport requested for patient.   CSW will sign off for now as social work intervention is no longer needed. Please consult [place] again if new needs arise.     Final next level of care: Skilled Nursing Facility Barriers to Discharge: Barriers Resolved   Patient Goals and CMS Choice Patient states their goals for this hospitalization and ongoing recovery are:: Rehab CMS Medicare.gov Compare Post Acute Care list provided to:: Patient Choice offered to / list presented to : Patient  Discharge Placement   Existing PASRR number confirmed : 12/04/21          Patient chooses bed at: [facility] Patient to be transferred to facility by: PTAR Name of family member notified: N/A Patient and family notified of of transfer: 12/04/21  Discharge Plan and Services In-house Referral: Clinical Social Work   Post Acute Care Choice: Skilled Nursing Facility                               Social Determinants of Health (SDOH) Interventions     Readmission Risk Interventions     No data to display

## 2021-12-04 NOTE — Progress Notes (Signed)
PTAR has arrived to transport pt to [facility]. Discharge paperwork given to EMTs. Pt has taken his belongings.

## 2021-12-04 NOTE — Progress Notes (Signed)
Report given to Nunzio Cory RN at [employer].

## 2022-05-18 ENCOUNTER — Other Ambulatory Visit: Payer: Self-pay | Admitting: Family Medicine

## 2022-05-18 DIAGNOSIS — F17210 Nicotine dependence, cigarettes, uncomplicated: Secondary | ICD-10-CM

## 2022-06-24 ENCOUNTER — Inpatient Hospital Stay: Admission: RE | Admit: 2022-06-24 | Payer: Medicare Other | Source: Ambulatory Visit

## 2023-05-20 ENCOUNTER — Inpatient Hospital Stay (HOSPITAL_COMMUNITY): Admit: 2023-05-20 | Payer: Medicare Other | Admitting: Orthopedic Surgery

## 2023-05-20 SURGERY — COMPUTER ASSISTED TOTAL KNEE ARTHROPLASTY
Anesthesia: Spinal | Site: Knee | Laterality: Left

## 2023-08-18 ENCOUNTER — Ambulatory Visit: Payer: Self-pay | Admitting: Student

## 2023-08-18 DIAGNOSIS — E119 Type 2 diabetes mellitus without complications: Secondary | ICD-10-CM

## 2023-08-23 ENCOUNTER — Ambulatory Visit: Payer: Self-pay | Admitting: Student

## 2023-08-23 NOTE — H&P (Signed)
 TOTAL KNEE ADMISSION H&P  Patient is being admitted for left total knee arthroplasty.  Subjective:  Chief Complaint:left knee pain.  HPI: Sean [name], 63 y.o. male, has a history of pain and functional disability in the left knee due to arthritis and has failed non-surgical conservative treatments for greater than 12 weeks to includeNSAID's and/or analgesics, corticosteriod injections, flexibility and strengthening excercises, use of assistive devices, weight reduction as appropriate, and activity modification.  Onset of symptoms was gradual, starting 10 years ago with rapidlly worsening course since that time. The patient noted no past surgery on the left knee(s).  Patient currently rates pain in the left knee(s) at 10 out of 10 with activity. Patient has night pain, worsening of pain with activity and weight bearing, pain that interferes with activities of daily living, pain with passive range of motion, crepitus, and joint swelling.  Patient has evidence of subchondral cysts, subchondral sclerosis, periarticular osteophytes, and joint space narrowing by imaging studies. There is no active infection.  Patient Active Problem List   Diagnosis Date Noted   Avascular necrosis of bone of hip, right (HCC) 11/26/2021   Controlled type 2 diabetes mellitus without complication, without long-term current use of insulin (HCC) 11/26/2021   HTN (hypertension) 11/26/2021   Obesity, Class III, BMI 40-49.9 (morbid obesity) (HCC) 11/26/2021   Impaired ambulation 11/26/2021   PULMONARY EMBOLISM, HX OF 04/19/2008   Past Medical History:  Diagnosis Date   Arthritis    Asthma    Depression    Diabetes mellitus without complication (HCC)    Dyspnea    Hypertension    Psychiatric problem     Past Surgical History:  Procedure Laterality Date   ANTERIOR APPROACH HEMI HIP ARTHROPLASTY Right 11/28/2021   Procedure: ANTERIOR APPROACH TOTAL HIP;  Surgeon: Samson Frederic, MD;  Location: [facility] OR;  Service:  Orthopedics;  Laterality: Right;   OTHER SURGICAL HISTORY     reports surgery on his head as a child- he is not sure why    Current Outpatient Medications  Medication Sig Dispense Refill Last Dose/Taking   cyclobenzaprine (FLEXERIL) 10 MG tablet Take 10 mg by mouth at bedtime.      DULoxetine (CYMBALTA) 30 MG capsule Take 30 mg by mouth every morning.      dutasteride (AVODART) 0.5 MG capsule Take 0.5 mg by mouth in the morning.      FLUoxetine (PROZAC) 40 MG capsule Take 40 mg by mouth every morning.      gabapentin (NEURONTIN) 400 MG capsule Take 400 mg by mouth in the morning.      lisinopril-hydrochlorothiazide (ZESTORETIC) 20-25 MG tablet Take 1 tablet by mouth every morning.      meloxicam (MOBIC) 15 MG tablet Take 15 mg by mouth in the morning.      Menthol, Topical Analgesic, (ICY HOT EX) Apply 1 application. topically 2 (two) times daily as needed (pain).      metFORMIN (GLUCOPHAGE) 500 MG tablet Take 500 mg by mouth in the morning and at bedtime.      omeprazole (PRILOSEC) 20 MG capsule Take 20 mg by mouth daily as needed (indigestion/heartburn.).      rosuvastatin (CRESTOR) 20 MG tablet Take 20 mg by mouth in the morning.      tamsulosin (FLOMAX) 0.4 MG CAPS capsule Take 1 capsule (0.4 mg total) by mouth daily. (Patient taking differently: Take 0.4 mg by mouth every evening.) 30 capsule     No current facility-administered medications for this visit.  No Known Allergies  Social History   Tobacco Use   Smoking status: Some Days    Types: Cigarettes   Smokeless tobacco: Not on file  Substance Use Topics   Alcohol use: No    Family History  Family history unknown: Yes     Review of Systems  Musculoskeletal:  Positive for arthralgias, gait problem and joint swelling.    Objective:  Physical Exam Constitutional:      Appearance: Normal appearance.  HENT:     Head: Normocephalic and atraumatic.     Nose: Nose normal.     Mouth/Throat:     Mouth: Mucous membranes  are moist.     Pharynx: Oropharynx is clear.  Eyes:     Conjunctiva/sclera: Conjunctivae normal.  Cardiovascular:     Rate and Rhythm: Normal rate and regular rhythm.     Pulses: Normal pulses.     Heart sounds: Normal heart sounds.  Pulmonary:     Effort: Pulmonary effort is normal.     Breath sounds: Normal breath sounds.  Abdominal:     General: Abdomen is flat.     Palpations: Abdomen is soft.  Genitourinary:    Comments: Deferred. Musculoskeletal:     Cervical back: Normal range of motion and neck supple.     Comments: Examination of the left knee reveals no skin wounds or lesions. He has some swelling, trace effusion. No warmth or erythema. Tenderness to palpation medial joint line, lateral joint line, peripatellar retinacular tissues with a positive grind sign. Varus deformity. Restricted range of motion. No ligamentous instability. Painless range of motion of the hip.  Distally, he has stocking glove sensory changes consistent with diabetic neuropathy. He has palpable pedal pulses. He has significant bilateral lower extremity edema and venous stasis skin changes.  Skin:    General: Skin is warm and dry.     Capillary Refill: Capillary refill takes less than 2 seconds.  Neurological:     General: No focal deficit present.     Mental Status: He is alert and oriented to person, place, and time.  Psychiatric:        Mood and Affect: Mood normal.        Behavior: Behavior normal.        Thought Content: Thought content normal.        Judgment: Judgment normal.     Vital signs in last 24 hours: @VSRANGES @  Labs:   Estimated body mass index is 38.58 kg/m as calculated from the following:   Height as of 08/27/23: 5\' 6"  (1.676 m).   Weight as of 08/27/23: 108.4 kg.   Imaging Review Plain radiographs demonstrate severe degenerative joint disease of the left knee(s). The overall alignment issignificant varus. The bone quality appears to be adequate for age and reported  activity level.      Assessment/Plan:  End stage arthritis, left knee   The patient history, physical examination, clinical judgment of the provider and imaging studies are consistent with end stage degenerative joint disease of the left knee(s) and total knee arthroplasty is deemed medically necessary. The treatment options including medical management, injection therapy arthroscopy and arthroplasty were discussed at length. The risks and benefits of total knee arthroplasty were presented and reviewed. The risks due to aseptic loosening, infection, stiffness, patella tracking problems, thromboembolic complications and other imponderables were discussed. The patient acknowledged the explanation, agreed to proceed with the plan and consent was signed. Patient is being admitted for inpatient treatment for surgery, pain  control, PT, OT, prophylactic antibiotics, VTE prophylaxis, progressive ambulation and ADL's and discharge planning. The patient is planning to be discharged to SNF after an overnight stay.    Therapy Plans: SNF then transition to outpatient.  Disposition: SNF. Patient reports if possible he would like to stay at  [facility] [phone]. He previously had a good experience there.  Planned DVT Prophylaxis: aspirin 81mg  BID DME needed: Has rolling walker.  PCP: Cleared.  TXA: IV Allergies:  - NDKA.  Anesthesia Concerns: None.  BMI: 38.3 Last HgbA1c: 5.9 Other: - T2DM with neuropathy, metformin. - Oxycodone, zofran, methocarbamol, has meloxicam.  - 08/26/23: Hgb 14.0, K+ 4.4, Cr. 1.04.  - Mupirocin.    Patient's anticipated LOS is less than 2 midnights, meeting these requirements: - Younger than 88 - Lives within 1 hour of care - Has a competent adult at home to recover with post-op recover - NO history of  - Chronic pain requiring opiods  - Diabetes  - Coronary Artery Disease  - Heart failure  - Heart attack  - Stroke  - DVT/VTE  - Cardiac  arrhythmia  - Respiratory Failure/COPD  - Renal failure  - Anemia  - Advanced Liver disease

## 2023-08-23 NOTE — H&P (View-Only) (Signed)
 TOTAL KNEE ADMISSION H&P  Patient is being admitted for left total knee arthroplasty.  Subjective:  Chief Complaint:left knee pain.  HPI: Sean [name], 63 y.o. male, has a history of pain and functional disability in the left knee due to arthritis and has failed non-surgical conservative treatments for greater than 12 weeks to includeNSAID's and/or analgesics, corticosteriod injections, flexibility and strengthening excercises, use of assistive devices, weight reduction as appropriate, and activity modification.  Onset of symptoms was gradual, starting 10 years ago with rapidlly worsening course since that time. The patient noted no past surgery on the left knee(s).  Patient currently rates pain in the left knee(s) at 10 out of 10 with activity. Patient has night pain, worsening of pain with activity and weight bearing, pain that interferes with activities of daily living, pain with passive range of motion, crepitus, and joint swelling.  Patient has evidence of subchondral cysts, subchondral sclerosis, periarticular osteophytes, and joint space narrowing by imaging studies. There is no active infection.  Patient Active Problem List   Diagnosis Date Noted   Avascular necrosis of bone of hip, right (HCC) 11/26/2021   Controlled type 2 diabetes mellitus without complication, without long-term current use of insulin (HCC) 11/26/2021   HTN (hypertension) 11/26/2021   Obesity, Class III, BMI 40-49.9 (morbid obesity) (HCC) 11/26/2021   Impaired ambulation 11/26/2021   PULMONARY EMBOLISM, HX OF 04/19/2008   Past Medical History:  Diagnosis Date   Arthritis    Asthma    Depression    Diabetes mellitus without complication (HCC)    Dyspnea    Hypertension    Psychiatric problem     Past Surgical History:  Procedure Laterality Date   ANTERIOR APPROACH HEMI HIP ARTHROPLASTY Right 11/28/2021   Procedure: ANTERIOR APPROACH TOTAL HIP;  Surgeon: Samson Frederic, MD;  Location: [facility] OR;  Service:  Orthopedics;  Laterality: Right;   OTHER SURGICAL HISTORY     reports surgery on his head as a child- he is not sure why    Current Outpatient Medications  Medication Sig Dispense Refill Last Dose/Taking   cyclobenzaprine (FLEXERIL) 10 MG tablet Take 10 mg by mouth at bedtime.      DULoxetine (CYMBALTA) 30 MG capsule Take 30 mg by mouth every morning.      dutasteride (AVODART) 0.5 MG capsule Take 0.5 mg by mouth in the morning.      FLUoxetine (PROZAC) 40 MG capsule Take 40 mg by mouth every morning.      gabapentin (NEURONTIN) 400 MG capsule Take 400 mg by mouth in the morning.      lisinopril-hydrochlorothiazide (ZESTORETIC) 20-25 MG tablet Take 1 tablet by mouth every morning.      meloxicam (MOBIC) 15 MG tablet Take 15 mg by mouth in the morning.      Menthol, Topical Analgesic, (ICY HOT EX) Apply 1 application. topically 2 (two) times daily as needed (pain).      metFORMIN (GLUCOPHAGE) 500 MG tablet Take 500 mg by mouth in the morning and at bedtime.      omeprazole (PRILOSEC) 20 MG capsule Take 20 mg by mouth daily as needed (indigestion/heartburn.).      rosuvastatin (CRESTOR) 20 MG tablet Take 20 mg by mouth in the morning.      tamsulosin (FLOMAX) 0.4 MG CAPS capsule Take 1 capsule (0.4 mg total) by mouth daily. (Patient taking differently: Take 0.4 mg by mouth every evening.) 30 capsule     No current facility-administered medications for this visit.  No Known Allergies  Social History   Tobacco Use   Smoking status: Some Days    Types: Cigarettes   Smokeless tobacco: Not on file  Substance Use Topics   Alcohol use: No    Family History  Family history unknown: Yes     Review of Systems  Musculoskeletal:  Positive for arthralgias, gait problem and joint swelling.    Objective:  Physical Exam Constitutional:      Appearance: Normal appearance.  HENT:     Head: Normocephalic and atraumatic.     Nose: Nose normal.     Mouth/Throat:     Mouth: Mucous membranes  are moist.     Pharynx: Oropharynx is clear.  Eyes:     Conjunctiva/sclera: Conjunctivae normal.  Cardiovascular:     Rate and Rhythm: Normal rate and regular rhythm.     Pulses: Normal pulses.     Heart sounds: Normal heart sounds.  Pulmonary:     Effort: Pulmonary effort is normal.     Breath sounds: Normal breath sounds.  Abdominal:     General: Abdomen is flat.     Palpations: Abdomen is soft.  Genitourinary:    Comments: Deferred. Musculoskeletal:     Cervical back: Normal range of motion and neck supple.     Comments: Examination of the left knee reveals no skin wounds or lesions. He has some swelling, trace effusion. No warmth or erythema. Tenderness to palpation medial joint line, lateral joint line, peripatellar retinacular tissues with a positive grind sign. Varus deformity. Restricted range of motion. No ligamentous instability. Painless range of motion of the hip.  Distally, he has stocking glove sensory changes consistent with diabetic neuropathy. He has palpable pedal pulses. He has significant bilateral lower extremity edema and venous stasis skin changes.  Skin:    General: Skin is warm and dry.     Capillary Refill: Capillary refill takes less than 2 seconds.  Neurological:     General: No focal deficit present.     Mental Status: He is alert and oriented to person, place, and time.  Psychiatric:        Mood and Affect: Mood normal.        Behavior: Behavior normal.        Thought Content: Thought content normal.        Judgment: Judgment normal.     Vital signs in last 24 hours: @VSRANGES @  Labs:   Estimated body mass index is 38.58 kg/m as calculated from the following:   Height as of 08/27/23: 5\' 6"  (1.676 m).   Weight as of 08/27/23: 108.4 kg.   Imaging Review Plain radiographs demonstrate severe degenerative joint disease of the left knee(s). The overall alignment issignificant varus. The bone quality appears to be adequate for age and reported  activity level.      Assessment/Plan:  End stage arthritis, left knee   The patient history, physical examination, clinical judgment of the provider and imaging studies are consistent with end stage degenerative joint disease of the left knee(s) and total knee arthroplasty is deemed medically necessary. The treatment options including medical management, injection therapy arthroscopy and arthroplasty were discussed at length. The risks and benefits of total knee arthroplasty were presented and reviewed. The risks due to aseptic loosening, infection, stiffness, patella tracking problems, thromboembolic complications and other imponderables were discussed. The patient acknowledged the explanation, agreed to proceed with the plan and consent was signed. Patient is being admitted for inpatient treatment for surgery, pain  control, PT, OT, prophylactic antibiotics, VTE prophylaxis, progressive ambulation and ADL's and discharge planning. The patient is planning to be discharged to SNF after an overnight stay.    Therapy Plans: SNF then transition to outpatient.  Disposition: SNF. Patient reports if possible he would like to stay at  [facility] [phone]. He previously had a good experience there.  Planned DVT Prophylaxis: aspirin 81mg  BID DME needed: Has rolling walker.  PCP: Cleared.  TXA: IV Allergies:  - NDKA.  Anesthesia Concerns: None.  BMI: 38.3 Last HgbA1c: 5.9 Other: - T2DM with neuropathy, metformin. - Oxycodone, zofran, methocarbamol, has meloxicam.  - 08/26/23: Hgb 14.0, K+ 4.4, Cr. 1.04.  - Mupirocin.    Patient's anticipated LOS is less than 2 midnights, meeting these requirements: - Younger than 88 - Lives within 1 hour of care - Has a competent adult at home to recover with post-op recover - NO history of  - Chronic pain requiring opiods  - Diabetes  - Coronary Artery Disease  - Heart failure  - Heart attack  - Stroke  - DVT/VTE  - Cardiac  arrhythmia  - Respiratory Failure/COPD  - Renal failure  - Anemia  - Advanced Liver disease

## 2023-08-26 NOTE — Progress Notes (Signed)
 COVID Vaccine received:  []  No [x]  Yes Date of any COVID positive Test in last 90 days: no PCP - [facility] Cardiologist - n/a  Chest x-ray -  EKG -  08/27/23 Epic Stress Test -  ECHO -  Cardiac Cath -   Bowel Prep - [x]  No  []   Yes ______  Pacemaker / ICD device [x]  No []  Yes   Spinal Cord Stimulator:[x]  No []  Yes       History of Sleep Apnea? [x]  No []  Yes   CPAP used?- [x]  No []  Yes    Does the patient monitor blood sugar?          []  No [x]  Yes  []  N/A  Patient has: []  NO Hx DM   []  Pre-DM                 []  DM1  [x]   DM2 Does patient have a [employer] or Dexacom? []  No [x]  Yes   Fasting Blood Sugar Ranges- 120-150 Checks Blood Sugar ___2__ times a week  GLP1 agonist / usual dose - no GLP1 instructions:  SGLT-2 inhibitors / usual dose - no SGLT-2 instructions:   Blood Thinner / Instructions:no Aspirin Instructions:no  Comments:   Activity level: Patient is able to climb a flight of stairs without difficulty; [x]  No CP  [x]  No SOB, Patient can /  perform ADLs without assistance.   Anesthesia review:   Patient denies shortness of breath, fever, cough and chest pain at PAT appointment.  Patient verbalized understanding and agreement to the Pre-Surgical Instructions that were given to them at this PAT appointment. Patient was also educated of the need to review these PAT instructions again prior to his/her surgery.I reviewed the appropriate phone numbers to call if they have any and questions or concerns.

## 2023-08-26 NOTE — Patient Instructions (Signed)
 SURGICAL WAITING ROOM VISITATION  Patients having surgery or a procedure may have no more than 2 support people in the waiting area - these visitors may rotate.    Children under the age of 61 must have an adult with them who is not the patient.  Due to an increase in RSV and influenza rates and associated hospitalizations, children ages 70 and under may not visit patients in [facility] hospitals.  Visitors with respiratory illnesses are discouraged from visiting and should remain at home.  If the patient needs to stay at the hospital during part of their recovery, the visitor guidelines for inpatient rooms apply. Pre-op nurse will coordinate an appropriate time for 1 support person to accompany patient in pre-op.  This support person may not rotate.    Please refer to the [facility] website for the visitor guidelines for Inpatients (after your surgery is over and you are in a regular room).       Your procedure is scheduled on: 09/01/23   Report to [facility] Main Entrance    Report to admitting at  12:45 PM   Call this number if you have problems the morning of surgery [phone]   Do not eat food :After Midnight.   After Midnight you may have the following liquids until 12;15 PM DAY OF SURGERY  Water Non-Citrus Juices (without pulp, NO RED-Apple, White grape, White cranberry) Black Coffee (NO MILK/CREAM OR CREAMERS, sugar ok)  Clear Tea (NO MILK/CREAM OR CREAMERS, sugar ok) regular and decaf                             Plain Jell-O (NO RED)                                           Fruit ices (not with fruit pulp, NO RED)                                     Popsicles (NO RED)                                                               Sports drinks like Gatorade (NO RED)                  The day of surgery:  Drink ONE (1) Pre-SurgeryG2 at 12:15 PM the morning of surgery. Drink in one sitting. Do not sip.  This drink was given to you during your hospital   pre-op appointment visit. Nothing else to drink after completing the  Pre-Surgery G2.       Oral Hygiene is also important to reduce your risk of infection.                                    Remember - BRUSH YOUR TEETH THE MORNING OF SURGERY WITH YOUR REGULAR TOOTHPASTE  DENTURES WILL BE REMOVED PRIOR TO SURGERY PLEASE DO NOT APPLY "Poly grip" OR ADHESIVES!!!   Stop all  vitamins and herbal supplements 7 days before surgery.   Take these medicines the morning of surgery with A SIP OF WATER: Cymbalta, Avodart, Prozac, Gabapentin, Omeprazole, Rosuvastatin  DO NOT TAKE ANY ORAL DIABETIC MEDICATIONS DAY OF YOUR SURGERY Hold Metformin the morning of surgery.             You may not have any metal on your body including hair pins, jewelry, and body piercing             Do not wear make-up, lotions, powders, perfumes/cologne, or deodorant              Men may shave face and neck.   Do not bring valuables to the hospital. [facility] IS NOT             RESPONSIBLE   FOR VALUABLES.   Contacts, glasses, dentures or bridgework may not be worn into surgery.   Bring small overnight bag day of surgery.   DO NOT BRING YOUR HOME MEDICATIONS TO THE HOSPITAL. PHARMACY WILL DISPENSE MEDICATIONS LISTED ON YOUR MEDICATION LIST TO YOU DURING YOUR ADMISSION IN THE HOSPITAL!    Patients discharged on the day of surgery will not be allowed to drive home.  Someone NEEDS to stay with you for the first 24 hours after anesthesia.   Special Instructions: Bring a copy of your healthcare power of attorney and living will documents the day of surgery if you haven't scanned them before.              Please read over the following fact sheets you were given: IF YOU HAVE QUESTIONS ABOUT YOUR PRE-OP INSTRUCTIONS PLEASE CALL [phone] [facility]   If you received a COVID test during your pre-op visit  it is requested that you wear a mask when out in public, stay away from anyone that may not be feeling well and  notify your surgeon if you develop symptoms. If you test positive for Covid or have been in contact with anyone that has tested positive in the last 10 days please notify you surgeon.      Pre-operative 5 CHG Bath Instructions   You can play a key role in reducing the risk of infection after surgery. Your skin needs to be as free of germs as possible. You can reduce the number of germs on your skin by washing with CHG (chlorhexidine gluconate) soap before surgery. CHG is an antiseptic soap that kills germs and continues to kill germs even after washing.   DO NOT use if you have an allergy to chlorhexidine/CHG or antibacterial soaps. If your skin becomes reddened or irritated, stop using the CHG and notify one of our RNs at [phone].   Please shower with the CHG soap starting 4 days before surgery using the following schedule:     Please keep in mind the following:  DO NOT shave, including legs and underarms, starting the day of your first shower.   You may shave your face at any point before/day of surgery.  Place clean sheets on your bed the day you start using CHG soap. Use a clean washcloth (not used since being washed) for each shower. DO NOT sleep with pets once you start using the CHG.   CHG Shower Instructions:  If you choose to wash your hair and private area, wash first with your normal shampoo/soap.  After you use shampoo/soap, rinse your hair and body thoroughly to remove shampoo/soap residue.  Turn the water OFF and apply about  3 tablespoons (45 ml) of CHG soap to a CLEAN washcloth.  Apply CHG soap ONLY FROM YOUR NECK DOWN TO YOUR TOES (washing for 3-5 minutes)  DO NOT use CHG soap on face, private areas, open wounds, or sores.  Pay special attention to the area where your surgery is being performed.  If you are having back surgery, having someone wash your back for you may be helpful. Wait 2 minutes after CHG soap is applied, then you may rinse off the CHG soap.  Pat  dry with a clean towel  Put on clean clothes/pajamas   If you choose to wear lotion, please use ONLY the CHG-compatible lotions on the back of this paper.     Additional instructions for the day of surgery: DO NOT APPLY any lotions, deodorants, cologne, or perfumes.   Put on clean/comfortable clothes.  Brush your teeth.  Ask your nurse before applying any prescription medications to the skin.      CHG Compatible Lotions   Aveeno Moisturizing lotion  Cetaphil Moisturizing Cream  Cetaphil Moisturizing Lotion  Clairol Herbal Essence Moisturizing Lotion, Dry Skin  Clairol Herbal Essence Moisturizing Lotion, Extra Dry Skin  Clairol Herbal Essence Moisturizing Lotion, Normal Skin  Curel Age Defying Therapeutic Moisturizing Lotion with Alpha Hydroxy  Curel Extreme Care Body Lotion  Curel Soothing Hands Moisturizing Hand Lotion  Curel Therapeutic Moisturizing Cream, Fragrance-Free  Curel Therapeutic Moisturizing Lotion, Fragrance-Free  Curel Therapeutic Moisturizing Lotion, Original Formula  Eucerin Daily Replenishing Lotion  Eucerin Dry Skin Therapy Plus Alpha Hydroxy Crme  Eucerin Dry Skin Therapy Plus Alpha Hydroxy Lotion  Eucerin Original Crme  Eucerin Original Lotion  Eucerin Plus Crme Eucerin Plus Lotion  Eucerin TriLipid Replenishing Lotion  Keri Anti-Bacterial Hand Lotion  Keri Deep Conditioning Original Lotion Dry Skin Formula Softly Scented  Keri Deep Conditioning Original Lotion, Fragrance Free Sensitive Skin Formula  Keri Lotion Fast Absorbing Fragrance Free Sensitive Skin Formula  Keri Lotion Fast Absorbing Softly Scented Dry Skin Formula  Keri Original Lotion  Keri Skin Renewal Lotion Keri Silky Smooth Lotion  Keri Silky Smooth Sensitive Skin Lotion  Nivea Body Creamy Conditioning Oil  Nivea Body Extra Enriched Lotion  Nivea Body Original Lotion  Nivea Body Sheer Moisturizing Lotion Nivea Crme  Nivea Skin Firming Lotion  NutraDerm 30 Skin Lotion  NutraDerm  Skin Lotion  NutraDerm Therapeutic Skin Cream  NutraDerm Therapeutic Skin Lotion  ProShield Protective Hand Cream   Incentive Spirometer  An incentive spirometer is a tool that can help keep your lungs clear and active. This tool measures how well you are filling your lungs with each breath. Taking long deep breaths may help reverse or decrease the chance of developing breathing (pulmonary) problems (especially infection) following: A long period of time when you are unable to move or be active. BEFORE THE PROCEDURE  If the spirometer includes an indicator to show your best effort, your nurse or respiratory therapist will set it to a desired goal. If possible, sit up straight or lean slightly forward. Try not to slouch. Hold the incentive spirometer in an upright position. INSTRUCTIONS FOR USE  Sit on the edge of your bed if possible, or sit up as far as you can in bed or on a chair. Hold the incentive spirometer in an upright position. Breathe out normally. Place the mouthpiece in your mouth and seal your lips tightly around it. Breathe in slowly and as deeply as possible, raising the piston or the ball toward the top of the column.  Hold your breath for 3-5 seconds or for as long as possible. Allow the piston or ball to fall to the bottom of the column. Remove the mouthpiece from your mouth and breathe out normally. Rest for a few seconds and repeat Steps 1 through 7 at least 10 times every 1-2 hours when you are awake. Take your time and take a few normal breaths between deep breaths. The spirometer may include an indicator to show your best effort. Use the indicator as a goal to work toward during each repetition. After each set of 10 deep breaths, practice coughing to be sure your lungs are clear. If you have an incision (the cut made at the time of surgery), support your incision when coughing by placing a pillow or rolled up towels firmly against it. Once you are able to get out of bed,  walk around indoors and cough well. You may stop using the incentive spirometer when instructed by your caregiver.  RISKS AND COMPLICATIONS Take your time so you do not get dizzy or light-headed. If you are in pain, you may need to take or ask for pain medication before doing incentive spirometry. It is harder to take a deep breath if you are having pain. AFTER USE Rest and breathe slowly and easily. It can be helpful to keep track of a log of your progress. Your caregiver can provide you with a simple table to help with this. If you are using the spirometer at home, follow these instructions: SEEK MEDICAL CARE IF:  You are having difficultly using the spirometer. You have trouble using the spirometer as often as instructed. Your pain medication is not giving enough relief while using the spirometer. You develop fever of 100.5 F (38.1 C) or higher. SEEK IMMEDIATE MEDICAL CARE IF:  You cough up bloody sputum that had not been present before. You develop fever of 102 F (38.9 C) or greater. You develop worsening pain at or near the incision site. MAKE SURE YOU:  Understand these instructions. Will watch your condition. Will get help right away if you are not doing well or get worse. Document Released: 10/26/2006 Document Revised: 09/07/2011 Document Reviewed: 12/27/2006 [facility] Patient Information 2014 [place], [place].

## 2023-08-27 ENCOUNTER — Other Ambulatory Visit: Payer: Self-pay

## 2023-08-27 ENCOUNTER — Encounter (HOSPITAL_COMMUNITY)
Admission: RE | Admit: 2023-08-27 | Discharge: 2023-08-27 | Disposition: A | Discharge status: Home / Self Care | Payer: Medicare Other | Source: Ambulatory Visit | Attending: Orthopedic Surgery | Admitting: Orthopedic Surgery

## 2023-08-27 ENCOUNTER — Encounter (HOSPITAL_COMMUNITY): Payer: Self-pay

## 2023-08-27 VITALS — BP 128/81 | HR 90 | Temp 97.9°F | Resp 16 | Ht 66.0 in | Wt 239.0 lb

## 2023-08-27 DIAGNOSIS — Z01818 Encounter for other preprocedural examination: Secondary | ICD-10-CM | POA: Insufficient documentation

## 2023-08-27 DIAGNOSIS — I159 Secondary hypertension, unspecified: Secondary | ICD-10-CM | POA: Diagnosis not present

## 2023-08-27 DIAGNOSIS — E119 Type 2 diabetes mellitus without complications: Secondary | ICD-10-CM | POA: Insufficient documentation

## 2023-08-27 HISTORY — DX: Unspecified asthma, uncomplicated: J45.909

## 2023-08-27 HISTORY — DX: Dyspnea, unspecified: R06.00

## 2023-08-27 LAB — CBC
HCT: 42.7 % (ref 39.0–52.0)
Hemoglobin: 14 g/dL (ref 13.0–17.0)
MCH: 32.1 pg (ref 26.0–34.0)
MCHC: 32.8 g/dL (ref 30.0–36.0)
MCV: 97.9 fL (ref 80.0–100.0)
Platelets: 239 10*3/uL (ref 150–400)
RBC: 4.36 MIL/uL (ref 4.22–5.81)
RDW: 12.4 % (ref 11.5–15.5)
WBC: 7.4 10*3/uL (ref 4.0–10.5)
nRBC: 0 % (ref 0.0–0.2)

## 2023-08-27 LAB — BASIC METABOLIC PANEL
Anion gap: 9 (ref 5–15)
BUN: 23 mg/dL — ABNORMAL HIGH (ref 6–20)
CO2: 24 mmol/L (ref 22–32)
Calcium: 9.4 mg/dL (ref 8.9–10.3)
Chloride: 103 mmol/L (ref 98–111)
Creatinine, Ser: 1.04 mg/dL (ref 0.61–1.24)
GFR, Estimated: >60 mL/min (ref >60)
Glucose, Bld: 91 mg/dL (ref 70–99)
Potassium: 4.4 mmol/L (ref 3.5–5.1)
Sodium: 136 mmol/L (ref 135–145)

## 2023-08-27 LAB — HEMOGLOBIN A1C
Hgb A1c MFr Bld: 5.9 % — ABNORMAL HIGH (ref 4.8–5.6)
Mean Plasma Glucose: 122.63 mg/dL

## 2023-08-27 LAB — GLUCOSE, CAPILLARY: Glucose-Capillary: 97 mg/dL (ref 70–99)

## 2023-08-28 LAB — SURGICAL PCR SCREEN
MRSA, PCR: NEGATIVE
Staphylococcus aureus: POSITIVE — AB

## 2023-08-30 NOTE — Progress Notes (Signed)
 Please review preop PCR result from 08/27/23.

## 2023-09-01 ENCOUNTER — Encounter (HOSPITAL_COMMUNITY)
Admission: RE | Disposition: A | Discharge status: Skilled Nursing Facility | Payer: Self-pay | Source: Home / Self Care | Attending: Orthopedic Surgery

## 2023-09-01 ENCOUNTER — Encounter (HOSPITAL_COMMUNITY): Payer: Self-pay | Admitting: Orthopedic Surgery

## 2023-09-01 ENCOUNTER — Ambulatory Visit (HOSPITAL_COMMUNITY)

## 2023-09-01 ENCOUNTER — Observation Stay (HOSPITAL_COMMUNITY)
Admission: RE | Admit: 2023-09-01 | Discharge: 2023-09-06 | Disposition: A | Discharge status: Home / Self Care | Payer: Medicare Other | Attending: Orthopedic Surgery | Admitting: Orthopedic Surgery

## 2023-09-01 ENCOUNTER — Other Ambulatory Visit: Payer: Self-pay

## 2023-09-01 ENCOUNTER — Ambulatory Visit (HOSPITAL_BASED_OUTPATIENT_CLINIC_OR_DEPARTMENT_OTHER)

## 2023-09-01 DIAGNOSIS — I10 Essential (primary) hypertension: Secondary | ICD-10-CM | POA: Diagnosis not present

## 2023-09-01 DIAGNOSIS — F17210 Nicotine dependence, cigarettes, uncomplicated: Secondary | ICD-10-CM | POA: Diagnosis not present

## 2023-09-01 DIAGNOSIS — J45909 Unspecified asthma, uncomplicated: Secondary | ICD-10-CM

## 2023-09-01 DIAGNOSIS — M1712 Unilateral primary osteoarthritis, left knee: Principal | ICD-10-CM | POA: Diagnosis present

## 2023-09-01 DIAGNOSIS — E119 Type 2 diabetes mellitus without complications: Secondary | ICD-10-CM | POA: Diagnosis not present

## 2023-09-01 DIAGNOSIS — Z7901 Long term (current) use of anticoagulants: Secondary | ICD-10-CM | POA: Diagnosis not present

## 2023-09-01 DIAGNOSIS — M25562 Pain in left knee: Secondary | ICD-10-CM | POA: Diagnosis present

## 2023-09-01 HISTORY — PX: KNEE ARTHROPLASTY: SHX992

## 2023-09-01 LAB — GLUCOSE, CAPILLARY
Glucose-Capillary: 112 mg/dL — ABNORMAL HIGH (ref 70–99)
Glucose-Capillary: 129 mg/dL — ABNORMAL HIGH (ref 70–99)
Glucose-Capillary: 195 mg/dL — ABNORMAL HIGH (ref 70–99)

## 2023-09-01 SURGERY — ARTHROPLASTY, KNEE, TOTAL, USING IMAGELESS COMPUTER-ASSISTED NAVIGATION
Anesthesia: Spinal | Site: Knee | Laterality: Left

## 2023-09-01 MED ORDER — OXYCODONE HCL 5 MG PO TABS
5.0000 mg | ORAL_TABLET | ORAL | Status: DC | PRN
  Administered 2023-09-02 (×3): 5 mg via ORAL
  Administered 2023-09-03 – 2023-09-05 (×7): 10 mg via ORAL
  Administered 2023-09-05: 5 mg via ORAL
  Administered 2023-09-06 (×4): 10 mg via ORAL
  Filled 2023-09-01 (×4): qty 2
  Filled 2023-09-01: qty 1
  Filled 2023-09-01 (×4): qty 2
  Filled 2023-09-01: qty 1
  Filled 2023-09-01 (×5): qty 2
  Filled 2023-09-01: qty 1

## 2023-09-01 MED ORDER — PROPOFOL 10 MG/ML IV BOLUS
INTRAVENOUS | Status: AC
  Filled 2023-09-01: qty 20

## 2023-09-01 MED ORDER — FENTANYL CITRATE (PF) 100 MCG/2ML IJ SOLN
INTRAMUSCULAR | Status: AC
  Filled 2023-09-01: qty 2

## 2023-09-01 MED ORDER — METOCLOPRAMIDE HCL 5 MG PO TABS: 5.0000 mg | ORAL_TABLET | Freq: Three times a day (TID) | ORAL | Status: DC | PRN

## 2023-09-01 MED ORDER — DEXAMETHASONE SODIUM PHOSPHATE 4 MG/ML IJ SOLN
INTRAMUSCULAR | Status: DC | PRN
  Administered 2023-09-01: 4 mg via PERINEURAL

## 2023-09-01 MED ORDER — MENTHOL 3 MG MT LOZG: 1.0000 | LOZENGE | OROMUCOSAL | Status: DC | PRN

## 2023-09-01 MED ORDER — CLONIDINE HCL (ANALGESIA) 100 MCG/ML EP SOLN
EPIDURAL | Status: DC | PRN
  Administered 2023-09-01: 60 ug

## 2023-09-01 MED ORDER — CEFAZOLIN SODIUM-DEXTROSE 2-4 GM/100ML-% IV SOLN
2.0000 g | INTRAVENOUS | Status: AC
  Administered 2023-09-01: 2 g via INTRAVENOUS
  Filled 2023-09-01: qty 100

## 2023-09-01 MED ORDER — ROSUVASTATIN CALCIUM 20 MG PO TABS
20.0000 mg | ORAL_TABLET | Freq: Every morning | ORAL | Status: DC
  Administered 2023-09-02 – 2023-09-06 (×5): 20 mg via ORAL
  Filled 2023-09-01 (×5): qty 1

## 2023-09-01 MED ORDER — BUPIVACAINE IN DEXTROSE 0.75-8.25 % IT SOLN
INTRATHECAL | Status: DC | PRN
  Administered 2023-09-01: 1.8 mL via INTRATHECAL

## 2023-09-01 MED ORDER — ONDANSETRON HCL 4 MG/2ML IJ SOLN
4.0000 mg | Freq: Four times a day (QID) | INTRAMUSCULAR | Status: DC | PRN
  Filled 2023-09-01: qty 2

## 2023-09-01 MED ORDER — DEXAMETHASONE SODIUM PHOSPHATE 10 MG/ML IJ SOLN
INTRAMUSCULAR | Status: AC
  Filled 2023-09-01: qty 1

## 2023-09-01 MED ORDER — ALUM & MAG HYDROXIDE-SIMETH 200-200-20 MG/5ML PO SUSP: 30.0000 mL | ORAL | Status: DC | PRN

## 2023-09-01 MED ORDER — POVIDONE-IODINE 10 % EX SWAB: 2.0000 | Freq: Once | CUTANEOUS | Status: DC

## 2023-09-01 MED ORDER — ASPIRIN 81 MG PO CHEW
81.0000 mg | CHEWABLE_TABLET | Freq: Two times a day (BID) | ORAL | Status: DC
  Administered 2023-09-01 – 2023-09-06 (×10): 81 mg via ORAL
  Filled 2023-09-01 (×10): qty 1

## 2023-09-01 MED ORDER — CEFAZOLIN SODIUM-DEXTROSE 2-4 GM/100ML-% IV SOLN
2.0000 g | Freq: Four times a day (QID) | INTRAVENOUS | Status: AC
  Administered 2023-09-01 – 2023-09-02 (×2): 2 g via INTRAVENOUS
  Filled 2023-09-01: qty 100

## 2023-09-01 MED ORDER — ONDANSETRON HCL 4 MG PO TABS: 4.0000 mg | ORAL_TABLET | Freq: Four times a day (QID) | ORAL | Status: DC | PRN

## 2023-09-01 MED ORDER — MIDAZOLAM HCL 2 MG/2ML IJ SOLN
1.0000 mg | INTRAMUSCULAR | Status: DC
Start: 2023-09-01 — End: 2023-09-01

## 2023-09-01 MED ORDER — DEXAMETHASONE SODIUM PHOSPHATE 10 MG/ML IJ SOLN
INTRAMUSCULAR | Status: DC | PRN
  Administered 2023-09-01: 10 mg via INTRAVENOUS

## 2023-09-01 MED ORDER — OXYCODONE HCL 5 MG PO TABS
10.0000 mg | ORAL_TABLET | ORAL | Status: DC | PRN
  Administered 2023-09-02 – 2023-09-04 (×7): 10 mg via ORAL
  Filled 2023-09-01 (×7): qty 2

## 2023-09-01 MED ORDER — ACETAMINOPHEN 500 MG PO TABS
ORAL_TABLET | ORAL | Status: AC
  Filled 2023-09-01: qty 2

## 2023-09-01 MED ORDER — SODIUM CHLORIDE (PF) 0.9 % IJ SOLN
INTRAMUSCULAR | Status: DC | PRN
  Administered 2023-09-01: 61 mL

## 2023-09-01 MED ORDER — ACETAMINOPHEN 325 MG PO TABS
325.0000 mg | ORAL_TABLET | Freq: Four times a day (QID) | ORAL | Status: DC | PRN
  Administered 2023-09-03 – 2023-09-04 (×3): 650 mg via ORAL
  Filled 2023-09-01 (×3): qty 2

## 2023-09-01 MED ORDER — DUTASTERIDE 0.5 MG PO CAPS
0.5000 mg | ORAL_CAPSULE | Freq: Every morning | ORAL | Status: DC
  Administered 2023-09-02 – 2023-09-06 (×5): 0.5 mg via ORAL
  Filled 2023-09-01 (×5): qty 1

## 2023-09-01 MED ORDER — ALBUMIN HUMAN 5 % IV SOLN
INTRAVENOUS | Status: AC
  Filled 2023-09-01: qty 250

## 2023-09-01 MED ORDER — FENTANYL CITRATE PF 50 MCG/ML IJ SOSY
50.0000 ug | PREFILLED_SYRINGE | INTRAMUSCULAR | Status: AC
  Administered 2023-09-01: 50 ug via INTRAVENOUS
  Filled 2023-09-01: qty 2

## 2023-09-01 MED ORDER — FLUOXETINE HCL 20 MG PO CAPS
40.0000 mg | ORAL_CAPSULE | Freq: Every morning | ORAL | Status: DC
  Administered 2023-09-02 – 2023-09-06 (×5): 40 mg via ORAL
  Filled 2023-09-01 (×6): qty 2

## 2023-09-01 MED ORDER — SODIUM CHLORIDE 0.9 % IR SOLN
Status: DC | PRN
  Administered 2023-09-01: 1000 mL
  Administered 2023-09-01: 500 mL

## 2023-09-01 MED ORDER — KETOROLAC TROMETHAMINE 15 MG/ML IJ SOLN
INTRAMUSCULAR | Status: AC
  Filled 2023-09-01: qty 1

## 2023-09-01 MED ORDER — PROPOFOL 1000 MG/100ML IV EMUL
INTRAVENOUS | Status: AC
  Filled 2023-09-01: qty 100

## 2023-09-01 MED ORDER — MUPIROCIN 2 % EX OINT: 1.0000 | TOPICAL_OINTMENT | Freq: Two times a day (BID) | CUTANEOUS | 0 refills | Status: AC

## 2023-09-01 MED ORDER — INSULIN ASPART 100 UNIT/ML IJ SOLN
0.0000 [IU] | Freq: Three times a day (TID) | INTRAMUSCULAR | Status: DC
  Administered 2023-09-02: 1 [IU] via SUBCUTANEOUS
  Administered 2023-09-02: 2 [IU] via SUBCUTANEOUS
  Administered 2023-09-02 – 2023-09-04 (×5): 1 [IU] via SUBCUTANEOUS
  Administered 2023-09-04: 2 [IU] via SUBCUTANEOUS
  Administered 2023-09-04 – 2023-09-06 (×3): 1 [IU] via SUBCUTANEOUS

## 2023-09-01 MED ORDER — METHOCARBAMOL 500 MG PO TABS
500.0000 mg | ORAL_TABLET | Freq: Four times a day (QID) | ORAL | Status: DC | PRN
  Administered 2023-09-02 – 2023-09-06 (×11): 500 mg via ORAL
  Filled 2023-09-01 (×11): qty 1

## 2023-09-01 MED ORDER — DULOXETINE HCL 30 MG PO CPEP
30.0000 mg | ORAL_CAPSULE | Freq: Every morning | ORAL | Status: DC
  Administered 2023-09-02 – 2023-09-06 (×5): 30 mg via ORAL
  Filled 2023-09-01 (×5): qty 1

## 2023-09-01 MED ORDER — PANTOPRAZOLE SODIUM 40 MG PO TBEC
40.0000 mg | DELAYED_RELEASE_TABLET | Freq: Every day | ORAL | Status: DC
  Administered 2023-09-01 – 2023-09-06 (×6): 40 mg via ORAL
  Filled 2023-09-01 (×6): qty 1

## 2023-09-01 MED ORDER — CEFAZOLIN SODIUM-DEXTROSE 2-4 GM/100ML-% IV SOLN
INTRAVENOUS | Status: AC
  Filled 2023-09-01: qty 100

## 2023-09-01 MED ORDER — BUPIVACAINE-EPINEPHRINE (PF) 0.25% -1:200000 IJ SOLN
INTRAMUSCULAR | Status: AC
  Filled 2023-09-01: qty 30

## 2023-09-01 MED ORDER — PHENOL 1.4 % MT LIQD: 1.0000 | OROMUCOSAL | Status: DC | PRN

## 2023-09-01 MED ORDER — SODIUM CHLORIDE (PF) 0.9 % IJ SOLN
INTRAMUSCULAR | Status: AC
  Filled 2023-09-01: qty 30

## 2023-09-01 MED ORDER — POLYETHYLENE GLYCOL 3350 17 G PO PACK
17.0000 g | PACK | Freq: Every day | ORAL | Status: DC | PRN
  Administered 2023-09-03 – 2023-09-06 (×4): 17 g via ORAL
  Filled 2023-09-01 (×5): qty 1

## 2023-09-01 MED ORDER — CHLORHEXIDINE GLUCONATE 4 % EX SOLN: 1.0000 | CUTANEOUS | 1 refills | Status: AC

## 2023-09-01 MED ORDER — HYDROMORPHONE HCL 1 MG/ML IJ SOLN: 0.5000 mg | INTRAMUSCULAR | Status: DC | PRN

## 2023-09-01 MED ORDER — LACTATED RINGERS IV SOLN: INTRAVENOUS | Status: DC

## 2023-09-01 MED ORDER — SODIUM CHLORIDE 0.9 % IV SOLN: INTRAVENOUS | Status: DC

## 2023-09-01 MED ORDER — TAMSULOSIN HCL 0.4 MG PO CAPS
0.4000 mg | ORAL_CAPSULE | Freq: Every day | ORAL | Status: DC
  Administered 2023-09-01 – 2023-09-06 (×6): 0.4 mg via ORAL
  Filled 2023-09-01 (×6): qty 1

## 2023-09-01 MED ORDER — CHLORHEXIDINE GLUCONATE 0.12 % MT SOLN
15.0000 mL | Freq: Once | OROMUCOSAL | Status: AC
  Administered 2023-09-01: 15 mL via OROMUCOSAL

## 2023-09-01 MED ORDER — DROPERIDOL 2.5 MG/ML IJ SOLN: 0.6250 mg | Freq: Once | INTRAMUSCULAR | Status: DC | PRN

## 2023-09-01 MED ORDER — METOCLOPRAMIDE HCL 5 MG/ML IJ SOLN: 5.0000 mg | Freq: Three times a day (TID) | INTRAMUSCULAR | Status: DC | PRN

## 2023-09-01 MED ORDER — ORAL CARE MOUTH RINSE: 15.0000 mL | Freq: Once | OROMUCOSAL | Status: AC

## 2023-09-01 MED ORDER — KETOROLAC TROMETHAMINE 15 MG/ML IJ SOLN
7.5000 mg | Freq: Four times a day (QID) | INTRAMUSCULAR | Status: AC
  Administered 2023-09-01 – 2023-09-02 (×4): 7.5 mg via INTRAVENOUS
  Filled 2023-09-01 (×3): qty 1

## 2023-09-01 MED ORDER — INSULIN ASPART 100 UNIT/ML IJ SOLN: 0.0000 [IU] | Freq: Every day | INTRAMUSCULAR | Status: DC

## 2023-09-01 MED ORDER — INSULIN ASPART 100 UNIT/ML IJ SOLN: 0.0000 [IU] | INTRAMUSCULAR | Status: DC | PRN

## 2023-09-01 MED ORDER — PHENYLEPHRINE HCL-NACL 20-0.9 MG/250ML-% IV SOLN
INTRAVENOUS | Status: DC | PRN
  Administered 2023-09-01: 25 ug/min via INTRAVENOUS

## 2023-09-01 MED ORDER — TRANEXAMIC ACID-NACL 1000-0.7 MG/100ML-% IV SOLN
1000.0000 mg | INTRAVENOUS | Status: AC
  Administered 2023-09-01: 1000 mg via INTRAVENOUS
  Filled 2023-09-01: qty 100

## 2023-09-01 MED ORDER — GABAPENTIN 400 MG PO CAPS
400.0000 mg | ORAL_CAPSULE | Freq: Every morning | ORAL | Status: DC
  Administered 2023-09-02 – 2023-09-06 (×5): 400 mg via ORAL
  Filled 2023-09-01 (×5): qty 1

## 2023-09-01 MED ORDER — KETOROLAC TROMETHAMINE 30 MG/ML IJ SOLN
INTRAMUSCULAR | Status: AC
  Filled 2023-09-01: qty 1

## 2023-09-01 MED ORDER — ROPIVACAINE HCL 5 MG/ML IJ SOLN
INTRAMUSCULAR | Status: DC | PRN
  Administered 2023-09-01: 20 mL via PERINEURAL

## 2023-09-01 MED ORDER — ALBUMIN HUMAN 5 % IV SOLN: INTRAVENOUS | Status: DC | PRN

## 2023-09-01 MED ORDER — METHOCARBAMOL 1000 MG/10ML IJ SOLN: 500.0000 mg | Freq: Four times a day (QID) | INTRAMUSCULAR | Status: DC | PRN

## 2023-09-01 MED ORDER — DOCUSATE SODIUM 100 MG PO CAPS
100.0000 mg | ORAL_CAPSULE | Freq: Two times a day (BID) | ORAL | Status: DC
  Administered 2023-09-01 – 2023-09-06 (×10): 100 mg via ORAL
  Filled 2023-09-01 (×10): qty 1

## 2023-09-01 MED ORDER — PROPOFOL 10 MG/ML IV BOLUS
INTRAVENOUS | Status: DC | PRN
  Administered 2023-09-01 (×2): 20 mg via INTRAVENOUS
  Administered 2023-09-01: 30 mg via INTRAVENOUS
  Administered 2023-09-01: 20 mg via INTRAVENOUS

## 2023-09-01 MED ORDER — ONDANSETRON HCL 4 MG/2ML IJ SOLN
INTRAMUSCULAR | Status: DC | PRN
  Administered 2023-09-01: 4 mg via INTRAVENOUS

## 2023-09-01 MED ORDER — ONDANSETRON HCL 4 MG/2ML IJ SOLN
INTRAMUSCULAR | Status: AC
Start: 2023-09-01 — End: ?
  Filled 2023-09-01: qty 2

## 2023-09-01 MED ORDER — ACETAMINOPHEN 500 MG PO TABS
1000.0000 mg | ORAL_TABLET | Freq: Once | ORAL | Status: AC
  Administered 2023-09-01: 1000 mg via ORAL
  Filled 2023-09-01: qty 2

## 2023-09-01 MED ORDER — POVIDONE-IODINE 10 % EX SWAB
2.0000 | Freq: Once | CUTANEOUS | Status: DC
Start: 2023-09-01 — End: 2023-09-01

## 2023-09-01 MED ORDER — HYDROMORPHONE HCL 1 MG/ML IJ SOLN: 0.2500 mg | INTRAMUSCULAR | Status: DC | PRN

## 2023-09-01 MED ORDER — DIPHENHYDRAMINE HCL 12.5 MG/5ML PO ELIX
12.5000 mg | ORAL_SOLUTION | ORAL | Status: DC | PRN
  Administered 2023-09-04 – 2023-09-05 (×2): 25 mg via ORAL
  Filled 2023-09-01 (×2): qty 10

## 2023-09-01 MED ORDER — PROPOFOL 500 MG/50ML IV EMUL
INTRAVENOUS | Status: DC | PRN
  Administered 2023-09-01: 100 ug/kg/min via INTRAVENOUS

## 2023-09-01 MED ORDER — STERILE WATER FOR IRRIGATION IR SOLN
Status: DC | PRN
  Administered 2023-09-01: 1000 mL

## 2023-09-01 MED ORDER — FENTANYL CITRATE (PF) 100 MCG/2ML IJ SOLN
INTRAMUSCULAR | Status: DC | PRN
  Administered 2023-09-01 (×4): 25 ug via INTRAVENOUS

## 2023-09-01 MED ORDER — ACETAMINOPHEN 500 MG PO TABS
1000.0000 mg | ORAL_TABLET | Freq: Four times a day (QID) | ORAL | Status: AC
Start: 2023-09-01 — End: 2023-09-02
  Administered 2023-09-01 – 2023-09-02 (×4): 1000 mg via ORAL
  Filled 2023-09-01 (×3): qty 2

## 2023-09-01 SURGICAL SUPPLY — 59 items
BAG COUNTER SPONGE SURGICOUNT (BAG) IMPLANT
BAG ZIPLOCK 12X15 (MISCELLANEOUS) IMPLANT
BATTERY INSTRU NAVIGATION (MISCELLANEOUS) ×3 IMPLANT
BLADE SAW RECIPROCATING 77.5 (BLADE) ×1 IMPLANT
BNDG ELASTIC 4INX 5YD STR LF (GAUZE/BANDAGES/DRESSINGS) ×1 IMPLANT
BNDG ELASTIC 6INX 5YD STR LF (GAUZE/BANDAGES/DRESSINGS) ×1 IMPLANT
CHLORAPREP W/TINT 26 (MISCELLANEOUS) ×2 IMPLANT
COMP FEM PS KNEE STD 10 LT (Knees) ×1 IMPLANT
COMP PATELLA 3 PEG 38 (Joint) ×1 IMPLANT
COMP TIB PS G 0D LT (Joint) ×1 IMPLANT
COMPONENT FEM PS KN STD 10 LT (Knees) IMPLANT
COMPONENT PATELLA 3 PEG 38 (Joint) IMPLANT
COMPONET TIB PS G 0D LT (Joint) IMPLANT
COVER SURGICAL LIGHT HANDLE (MISCELLANEOUS) ×1 IMPLANT
DERMABOND ADVANCED .7 DNX12 (GAUZE/BANDAGES/DRESSINGS) ×2 IMPLANT
DRAPE SHEET LG 3/4 BI-LAMINATE (DRAPES) ×3 IMPLANT
DRAPE U-SHAPE 47X51 STRL (DRAPES) ×1 IMPLANT
DRSG AQUACEL AG ADV 3.5X10 (GAUZE/BANDAGES/DRESSINGS) ×1 IMPLANT
ELECT BLADE TIP CTD 4 INCH (ELECTRODE) ×1 IMPLANT
ELECT REM PT RETURN 15FT ADLT (MISCELLANEOUS) ×1 IMPLANT
GAUZE SPONGE 4X4 12PLY STRL (GAUZE/BANDAGES/DRESSINGS) ×1 IMPLANT
GLOVE BIO SURGEON STRL SZ7 (GLOVE) ×1 IMPLANT
GLOVE BIO SURGEON STRL SZ8.5 (GLOVE) ×2 IMPLANT
GLOVE BIOGEL PI IND STRL 7.5 (GLOVE) ×1 IMPLANT
GLOVE BIOGEL PI IND STRL 8.5 (GLOVE) ×1 IMPLANT
GOWN SPEC L3 XXLG W/TWL (GOWN DISPOSABLE) ×1 IMPLANT
GOWN STRL REUS W/ TWL XL LVL3 (GOWN DISPOSABLE) ×1 IMPLANT
HOLDER FOLEY CATH W/STRAP (MISCELLANEOUS) ×1 IMPLANT
HOOD PEEL AWAY T7 (MISCELLANEOUS) ×3 IMPLANT
KIT TURNOVER KIT A (KITS) IMPLANT
LINER TIB ASF PS GH/8-11 14 LT (Liner) IMPLANT
MARKER SKIN DUAL TIP RULER LAB (MISCELLANEOUS) ×1 IMPLANT
NDL SAFETY ECLIPSE 18X1.5 (NEEDLE) ×1 IMPLANT
NDL SPNL 18GX3.5 QUINCKE PK (NEEDLE) ×1 IMPLANT
NEEDLE SPNL 18GX3.5 QUINCKE PK (NEEDLE) ×1 IMPLANT
NS IRRIG 1000ML POUR BTL (IV SOLUTION) ×1 IMPLANT
PACK TOTAL KNEE CUSTOM (KITS) ×1 IMPLANT
PADDING CAST COTTON 6X4 STRL (CAST SUPPLIES) ×1 IMPLANT
PIN DRILL HDLS TROCAR 75 4PK (PIN) IMPLANT
PROTECTOR NERVE ULNAR (MISCELLANEOUS) ×1 IMPLANT
SAW OSC TIP CART 19.5X105X1.3 (SAW) ×1 IMPLANT
SCREW FEMALE HEX FIX 25X2.5 (ORTHOPEDIC DISPOSABLE SUPPLIES) IMPLANT
SEALER BIPOLAR AQUA 6.0 (INSTRUMENTS) ×1 IMPLANT
SET HNDPC FAN SPRY TIP SCT (DISPOSABLE) ×1 IMPLANT
SET PAD KNEE POSITIONER (MISCELLANEOUS) ×1 IMPLANT
SOLUTION PRONTOSAN WOUND 350ML (IRRIGATION / IRRIGATOR) ×1 IMPLANT
SPIKE FLUID TRANSFER (MISCELLANEOUS) ×2 IMPLANT
SUT MNCRL AB 3-0 PS2 18 (SUTURE) ×1 IMPLANT
SUT MON AB 2-0 CT1 36 (SUTURE) ×1 IMPLANT
SUT STRATAFIX 14 PDO 48 VLT (SUTURE) ×1 IMPLANT
SUT STRATAFIX PDO 1 14 VIOLET (SUTURE) ×1 IMPLANT
SUT VIC AB 1 CTX36XBRD ANBCTR (SUTURE) ×2 IMPLANT
SUT VIC AB 2-0 CT1 TAPERPNT 27 (SUTURE) ×1 IMPLANT
SYR 3ML LL SCALE MARK (SYRINGE) ×1 IMPLANT
TOWEL GREEN STERILE FF (TOWEL DISPOSABLE) ×1 IMPLANT
TRAY FOLEY MTR SLVR 16FR STAT (SET/KITS/TRAYS/PACK) IMPLANT
TUBE SUCTION HIGH CAP CLEAR NV (SUCTIONS) ×1 IMPLANT
WATER STERILE IRR 1000ML POUR (IV SOLUTION) ×2 IMPLANT
WRAP KNEE MAXI GEL POST OP (GAUZE/BANDAGES/DRESSINGS) IMPLANT

## 2023-09-01 NOTE — Anesthesia Procedure Notes (Signed)
 Anesthesia Regional Block: Adductor canal block   Pre-Anesthetic Checklist: , timeout performed,  Correct Patient, Correct Site, Correct Laterality,  Correct Procedure, Correct Position, site marked,  Risks and benefits discussed,  Surgical consent,  Pre-op evaluation,  At surgeon's request and post-op pain management  Laterality: Lower and Left  Prep: chloraprep       Needles:  Injection technique: Single-shot  Needle Type: Stimiplex     Needle Length: 9cm  Needle Gauge: 21     Additional Needles:   Procedures:,,,, ultrasound used (permanent image in chart),,    Narrative:  Start time: 09/01/2023 1:42 PM End time: 09/01/2023 2:02 PM Injection made incrementally with aspirations every 5 mL.  Performed by: Personally  Anesthesiologist: Lewie Loron, MD  Additional Notes: BP cuff, EKG monitors applied. Sedation begun. Artery and nerve location verified with ultrasound. Anesthetic injected incrementally (5ml), slowly, and after negative aspirations under direct u/s guidance. Good fascial/perineural spread. Tolerated well.

## 2023-09-01 NOTE — Anesthesia Procedure Notes (Signed)
 Spinal  Patient location during procedure: OR Start time: 09/01/2023 2:17 PM End time: 09/01/2023 2:21 PM Reason for block: surgical anesthesia Staffing Performed: anesthesiologist  Anesthesiologist: Lewie Loron, MD Performed by: Lewie Loron, MD Authorized by: Lewie Loron, MD   Preanesthetic Checklist Completed: patient identified, IV checked, site marked, risks and benefits discussed, surgical consent, monitors and equipment checked, pre-op evaluation and timeout performed Spinal Block Patient position: sitting Prep: DuraPrep and site prepped and draped Patient monitoring: heart rate, continuous pulse ox and blood pressure Approach: midline Location: L3-4 Injection technique: single-shot Needle Needle type: Spinocan  Needle gauge: 25 G Needle length: 9 cm Additional Notes Expiration date of kit checked and confirmed. Patient tolerated procedure well, without complications.

## 2023-09-01 NOTE — Interval H&P Note (Signed)
 History and Physical Interval Note:  09/01/2023 2:20 PM  Sean [name]  has presented today for surgery, with the diagnosis of Left knee osteoarthritis.  The various methods of treatment have been discussed with the patient and family. After consideration of risks, benefits and other options for treatment, the patient has consented to  Procedure(s): ARTHROPLASTY, KNEE, TOTAL, USING IMAGELESS COMPUTER-ASSISTED NAVIGATION (Left) as a surgical intervention.  The patient's history has been reviewed, patient examined, no change in status, stable for surgery.  I have reviewed the patient's chart and labs.  Questions were answered to the patient's satisfaction.     [name]

## 2023-09-01 NOTE — Anesthesia Postprocedure Evaluation (Signed)
 Anesthesia Post Note  Patient: Sean [name]  Procedure(s) Performed: LEFT ARTHROPLASTY, KNEE, TOTAL, USING IMAGELESS COMPUTER-ASSISTED NAVIGATION (Left: Knee)     Patient location during evaluation: PACU Anesthesia Type: Spinal Level of consciousness: oriented and awake and alert Pain management: pain level controlled Vital Signs Assessment: post-procedure vital signs reviewed and stable Respiratory status: spontaneous breathing, respiratory function stable and nonlabored ventilation Cardiovascular status: blood pressure returned to baseline and stable Postop Assessment: no headache, no backache, no apparent nausea or vomiting, spinal receding and patient able to bend at knees Anesthetic complications: no   No notable events documented.  Last Vitals:  Vitals:   09/01/23 1830 09/01/23 1845  BP: 113/78 119/81  Pulse: 72 74  Resp: 11 16  Temp:    SpO2: 97% 100%    Last Pain:  Vitals:   09/01/23 1845  TempSrc:   PainSc: 0-No pain                 Theda Payer A.

## 2023-09-01 NOTE — Anesthesia Preprocedure Evaluation (Addendum)
 Anesthesia Evaluation  Patient identified by MRN, date of birth, ID band Patient awake    Reviewed: Allergy & Precautions, NPO status , Patient's Chart, lab work & pertinent test results  Airway Mallampati: III  TM Distance: >3 FB Neck ROM: Full    Dental  (+) Poor Dentition, Missing, Dental Advisory Given, Chipped   Pulmonary shortness of breath, asthma , Current Smoker, former smoker   breath sounds clear to auscultation       Cardiovascular hypertension, Pt. on medications Normal cardiovascular exam Rhythm:Regular Rate:Normal     Neuro/Psych  PSYCHIATRIC DISORDERS  Depression    negative neurological ROS     GI/Hepatic Neg liver ROS,GERD  Medicated and Controlled,,  Endo/Other  diabetes, Well Controlled, Type 2, Oral Hypoglycemic Agents  Class 3 obesityHyperlipidemia  Renal/GU negative Renal ROS     Musculoskeletal  (+) Arthritis , Osteoarthritis,  AVN right hip   Abdominal  (+) + obese  Peds  Hematology   Anesthesia Other Findings   Reproductive/Obstetrics                             Anesthesia Physical Anesthesia Plan  ASA: 3  Anesthesia Plan: Spinal   Post-op Pain Management: Regional block* and Tylenol PO (pre-op)*   Induction: Intravenous  PONV Risk Score and Plan: 2 and Treatment may vary due to age or medical condition, Ondansetron, Midazolam, Propofol infusion and TIVA  Airway Management Planned: Natural Airway and Simple Face Mask  Additional Equipment:   Intra-op Plan:   Post-operative Plan:   Informed Consent: I have reviewed the patients History and Physical, chart, labs and discussed the procedure including the risks, benefits and alternatives for the proposed anesthesia with the patient or authorized representative who has indicated his/her understanding and acceptance.     Dental advisory given  Plan Discussed with: CRNA  Anesthesia Plan Comments:          Anesthesia Quick Evaluation

## 2023-09-01 NOTE — Anesthesia Procedure Notes (Signed)
 Date/Time: 09/01/2023 2:23 PM  Performed by: Maurene Capes, CRNAOxygen Delivery Method: Simple face mask Placement Confirmation: positive ETCO2 Dental Injury: Teeth and Oropharynx as per pre-operative assessment

## 2023-09-01 NOTE — Transfer of Care (Signed)
 Immediate Anesthesia Transfer of Care Note  Patient: Sean [name]  Procedure(s) Performed: LEFT ARTHROPLASTY, KNEE, TOTAL, USING IMAGELESS COMPUTER-ASSISTED NAVIGATION (Left: Knee)  Patient Location: PACU  Anesthesia Type:MAC combined with regional for post-op pain  Level of Consciousness: awake, alert , and oriented  Airway & Oxygen Therapy: Patient Spontanous Breathing and Patient connected to nasal cannula oxygen  Post-op Assessment: Report given to RN and Post -op Vital signs reviewed and stable  Post vital signs: Reviewed and stable  Last Vitals:  Vitals Value Taken Time  BP 114/82 09/01/23 1815  Temp    Pulse 74 09/01/23 1817  Resp 20 09/01/23 1817  SpO2 96 % 09/01/23 1817  Vitals shown include unfiled device data.  Last Pain:  Vitals:   09/01/23 1223  TempSrc:   PainSc: 5          Complications: No notable events documented.

## 2023-09-01 NOTE — Op Note (Signed)
 OPERATIVE REPORT  SURGEON: Samson Frederic, MD   ASSISTANT: Skip Mayer, PA-C  PREOPERATIVE DIAGNOSIS: Primary Left knee arthritis.   POSTOPERATIVE DIAGNOSIS: Primary Left knee arthritis.   PROCEDURE: Computer assisted Left total knee arthroplasty.   IMPLANTS: Zimmer Persona PPS Cementless CR femur, size 10. Persona 0 degree Spiked Keel OsseoTi Tibia, size G. Vivacit-E polyethelyene insert, size 14 mm, MC. OsseoTi 3-Peg patella, size 38 mm.  ANESTHESIA:  MAC, Regional, and Spinal  TOURNIQUET TIME: Not utilized.   ESTIMATED BLOOD LOSS:-300 mL    ANTIBIOTICS: 2g Ancef.  DRAINS: None.  COMPLICATIONS: None   CONDITION: PACU - hemodynamically stable.   BRIEF CLINICAL NOTE: Sean [name] is a 63 y.o. male with a long-standing history of Left knee arthritis. After failing conservative management, the patient was indicated for total knee arthroplasty. The risks, benefits, and alternatives to the procedure were explained, and the patient elected to proceed.  PROCEDURE IN DETAIL: Adductor canal block was obtained in the pre-op holding area. Once inside the operative room, spinal anesthesia was obtained, and a foley catheter was inserted. The patient was then positioned and the lower extremity was prepped and draped in the normal sterile surgical fashion.  A time-out was called verifying side and site of surgery. The patient received IV antibiotics within 60 minutes of beginning the procedure. A tourniquet was not utilized.   An anterior approach to the knee was performed utilizing a midvastus arthrotomy. A medial release was performed and the patellar fat pad was excised. Stryker imageless navigation was used to cut the distal femur perpendicular to the mechanical axis. A freehand patellar resection was performed, and the patella was sized an prepared with 3 lug holes.  Nagivation was used to make a neutral proximal tibia resection, taking 9 mm of bone from the less affected lateral side  with 3 degrees of slope. The menisci were excised. A spacer block was placed, and the alignment and balance in extension were confirmed.   The distal femur was sized using the 3-degree external rotation guide referencing the posterior femoral cortex. The appropriate 4-in-1 cutting block was pinned into place. Rotation was checked using Whiteside's line, the epicondylar axis, and then confirmed with a spacer block in flexion. The remaining femoral cuts were performed, taking care to protect the MCL.  The tibia was sized and the trial tray was pinned into place. The remaining trail components were inserted. The knee was stable to varus and valgus stress through a full range of motion. The patella tracked centrally, and the PCL was well balanced. The trial components were removed, and the proximal tibial surface was prepared. Final components were impacted into place. The knee was tested for a final time and found to be well balanced.   The wound was copiously irrigated with Prontosan solution and normal saline using pulse lavage.  Marcaine solution was injected into the periarticular soft tissue.  The wound was closed in layers using #1 Vicryl and Stratafix for the fascia, 2-0 Vicryl for the subcutaneous fat, 2-0 Monocryl for the deep dermal layer, 3-0 running Monocryl subcuticular Stitch, and 4-0 Monocryl stay sutures at both ends of the wound. Dermabond was applied to the skin.  Once the glue was fully dried, an Aquacell Ag and compressive dressing were applied.  The patient was transported to the recovery room in stable condition.  Sponge, needle, and instrument counts were correct at the end of the case x2.  The patient tolerated the procedure well and there were no known complications.  The aquamantis was utilized for this case to help facilitate better hemostasis as patient was felt to be at increased risk of bleeding because of complex case requiring increased OR time and/or exposure.  -minimally  invasive approach.  A oscillating saw tip was utilized for this case to prevent damage to the soft tissue structures such as muscles, ligaments and tendons, and to ensure accurate bone cuts. This patient was at increased risk for above structures due to  minimally invasive approach.  Please note that a surgical assistant was a medical necessity for this procedure in order to perform it in a safe and expeditious manner. Surgical assistant was necessary to retract the ligaments and vital neurovascular structures to prevent injury to them and also necessary for proper positioning of the limb to allow for anatomic placement of the prosthesis.

## 2023-09-01 NOTE — Anesthesia Procedure Notes (Signed)
 Procedure Name: MAC Date/Time: 09/01/2023 2:12 PM  Performed by: Maurene Capes, CRNAPre-anesthesia Checklist: Patient identified, Emergency Drugs available, Suction available, Patient being monitored and Timeout performed Patient Re-evaluated:Patient Re-evaluated prior to induction Oxygen Delivery Method: Nasal cannula Preoxygenation: Pre-oxygenation with 100% oxygen Induction Type: IV induction Placement Confirmation: positive ETCO2 Dental Injury: Teeth and Oropharynx as per pre-operative assessment

## 2023-09-02 ENCOUNTER — Encounter (HOSPITAL_COMMUNITY): Payer: Self-pay | Admitting: Orthopedic Surgery

## 2023-09-02 DIAGNOSIS — M1712 Unilateral primary osteoarthritis, left knee: Secondary | ICD-10-CM | POA: Diagnosis not present

## 2023-09-02 LAB — CBC
HCT: 34 % — ABNORMAL LOW (ref 39.0–52.0)
Hemoglobin: 11.1 g/dL — ABNORMAL LOW (ref 13.0–17.0)
MCH: 32.4 pg (ref 26.0–34.0)
MCHC: 32.6 g/dL (ref 30.0–36.0)
MCV: 99.1 fL (ref 80.0–100.0)
Platelets: 214 10*3/uL (ref 150–400)
RBC: 3.43 MIL/uL — ABNORMAL LOW (ref 4.22–5.81)
RDW: 12 % (ref 11.5–15.5)
WBC: 16.8 10*3/uL — ABNORMAL HIGH (ref 4.0–10.5)
nRBC: 0 % (ref 0.0–0.2)

## 2023-09-02 LAB — BASIC METABOLIC PANEL
Anion gap: 7 (ref 5–15)
BUN: 22 mg/dL — ABNORMAL HIGH (ref 6–20)
CO2: 21 mmol/L — ABNORMAL LOW (ref 22–32)
Calcium: 8.9 mg/dL (ref 8.9–10.3)
Chloride: 105 mmol/L (ref 98–111)
Creatinine, Ser: 0.9 mg/dL (ref 0.61–1.24)
GFR, Estimated: >60 mL/min (ref >60)
Glucose, Bld: 148 mg/dL — ABNORMAL HIGH (ref 70–99)
Potassium: 4.9 mmol/L (ref 3.5–5.1)
Sodium: 133 mmol/L — ABNORMAL LOW (ref 135–145)

## 2023-09-02 LAB — GLUCOSE, CAPILLARY
Glucose-Capillary: 139 mg/dL — ABNORMAL HIGH (ref 70–99)
Glucose-Capillary: 175 mg/dL — ABNORMAL HIGH (ref 70–99)
Glucose-Capillary: 146 mg/dL — ABNORMAL HIGH (ref 70–99)
Glucose-Capillary: 139 mg/dL — ABNORMAL HIGH (ref 70–99)

## 2023-09-02 MED ORDER — METFORMIN HCL 500 MG PO TABS
500.0000 mg | ORAL_TABLET | Freq: Every day | ORAL | Status: DC
  Administered 2023-09-03 – 2023-09-06 (×4): 500 mg via ORAL
  Filled 2023-09-02 (×4): qty 1

## 2023-09-02 NOTE — Progress Notes (Signed)
 Physical Therapy Treatment Patient Details Name: Sean [name] MRN: [id number] DOB: Nov 27, 1960 Today's Date: 09/02/2023   History of Present Illness Pt is a 63 year old male s/p L TKA on 09/01/23.  PMHx: R THA 11/28/2021, DM, dyspnea, HTN    PT Comments  Pt assisted with ambulating short distance and then returned to bed.  Pt reports resting/sleeping is important for his recovery.  Pt performed a couple exercises in bed and then ice applied to knee end of session.     If plan is discharge home, recommend the following: A little help with walking and/or transfers;A little help with bathing/dressing/bathroom;Assistance with cooking/housework;Assist for transportation;Help with stairs or ramp for entrance   Can travel by private vehicle        Equipment Recommendations  Rolling walker (2 wheels)    Recommendations for Other Services       Precautions / Restrictions Precautions Precautions: Fall;Knee Restrictions Other Position/Activity Restrictions: WBAT     Mobility  Bed Mobility Overal bed mobility: [name] Assistance Bed Mobility: Supine to Sit, Sit to Supine     Supine to sit: Min assist Sit to supine: Min assist   General bed mobility comments: assist for L LE    Transfers Overall transfer level: [name] assistance Equipment used: Rolling walker (2 wheels) Transfers: Sit to/from Stand Sit to Stand: Contact guard assist, From elevated surface           General transfer comment: verbal cues for UE and LE positioning for pain control    Ambulation/Gait Ambulation/Gait assistance: Contact guard assist Gait Distance (Feet): 50 Feet Assistive device: Rolling walker (2 wheels) Gait Pattern/deviations: Step-to pattern, Decreased stance time - left, Antalgic Gait velocity: decr     General Gait Details: verbal cues for sequence, RW positioning, step length, safety   Stairs             Wheelchair Mobility     Tilt Bed    Modified Rankin (Stroke Patients  Only)       Balance                                            Communication Communication Communication: No apparent difficulties  Cognition Arousal: Alert Behavior During Therapy: WFL for tasks assessed/performed   PT - Cognitive impairments: No apparent impairments                         Following commands: Intact      Cueing    Exercises Total Joint Exercises Ankle Circles/Pumps: AROM, Both, 5 reps Quad Sets: AROM, Left, 10 reps Heel Slides: AROM, Left, 10 reps, Seated Goniometric ROM: approx 85* AAROM knee flexion EOB    General Comments        Pertinent Vitals/Pain Pain Assessment Pain Assessment: 0-10 Pain Score: 6  Pain Location: left knee and thigh Pain Descriptors / Indicators: Sore, Tightness, Guarding, Grimacing Pain Intervention(s): Monitored during session, Ice applied, Repositioned, Premedicated before session    Home Living                          Prior Function            PT Goals (current goals can now be found in the care plan section) Progress towards PT goals: Progressing toward goals    Frequency  7X/week      PT Plan      Co-evaluation              AM-PAC PT "6 Clicks" Mobility   Outcome Measure  Help needed turning from your back to your side while in a flat bed without using bedrails?: A Little Help needed moving from lying on your back to sitting on the side of a flat bed without using bedrails?: A Little Help needed moving to and from a bed to a chair (including a wheelchair)?: A Little Help needed standing up from a chair using your arms (e.g., wheelchair or bedside chair)?: A Little Help needed to walk in hospital room?: A Little Help needed climbing 3-5 steps with a railing? : A Lot 6 Click Score: 17    End of Session Equipment Utilized During Treatment: Gait belt Activity Tolerance: Patient tolerated treatment well Patient left: with call bell/phone within  reach;in bed;with bed alarm set   PT Visit Diagnosis: Difficulty in walking, not elsewhere classified (R26.2);Pain Pain - Right/Left: Left Pain - part of body: Knee     Time: 1431-1449 PT Time Calculation (min) (ACUTE ONLY): 18 min  Charges:    $Gait Training: 8-22 mins PT General Charges $$ ACUTE PT VISIT: 1 Visit                     Paulino Door, DPT Physical Therapist Acute Rehabilitation Services Office: [phone]    [name] 09/02/2023, 3:35 PM

## 2023-09-02 NOTE — Progress Notes (Signed)
    Subjective:  Patient reports pain as mild to moderate.  Denies N/V/CP/SOB. C/o L knee pain.  Objective:   VITALS:   Vitals:   09/01/23 2130 09/01/23 2307 09/02/23 0256 09/02/23 0727  BP: 119/72 116/69 129/82 (!) 141/78  Pulse: 74 75 79 69  Resp: 18 17 16 18   Temp: 97.6 F (36.4 C) 97.7 F (36.5 C) 98.4 F (36.9 C) 98.4 F (36.9 C)  TempSrc: Oral   Oral  SpO2: 98% 98% 97% 100%  Weight:      Height:        NAD ABD soft Sensation intact distally Intact pulses distally Dorsiflexion/Plantar flexion intact Incision: dressing C/D/I Compartment soft   Lab Results  Component Value Date   WBC 16.8 (H) 09/02/2023   HGB 11.1 (L) 09/02/2023   HCT 34.0 (L) 09/02/2023   MCV 99.1 09/02/2023   PLT 214 09/02/2023   BMET    Component Value Date/Time   NA 133 (L) 09/02/2023 0326   K 4.9 09/02/2023 0326   CL 105 09/02/2023 0326   CO2 21 (L) 09/02/2023 0326   GLUCOSE 148 (H) 09/02/2023 0326   BUN 22 (H) 09/02/2023 0326   CREATININE 0.90 09/02/2023 0326   CALCIUM 8.9 09/02/2023 0326   GFRNONAA >60 09/02/2023 0326     Assessment/Plan: 1 Day Post-Op   Principal Problem:   Degenerative arthritis of left knee   WBAT with walker DVT ppx: Aspirin, SCDs, TEDS PO pain control PT/OT DM2: FSBS, SSI, will resume metformin tomorrow am ABLA: asymptomatic, monitor Dispo: SNF placement: patient desires Sharin Mons Tristin Gladman 09/02/2023, 10:44 AM   Samson Frederic, MD [phone] [facility] is [facility]  [place] [address], [address], [place], [place] [address] Phone: [phone] [email or web address] Facebook  [employer]

## 2023-09-02 NOTE — Evaluation (Signed)
 Physical Therapy Evaluation Patient Details Name: Sean [name] MRN: [id number] DOB: April 13, 1961 Today's Date: 09/02/2023  History of Present Illness  Pt is a 63 year old male s/p L TKA on 09/01/23.  PMHx: R THA 11/28/2021, DM, dyspnea, HTN  Clinical Impression  Pt is s/p TKA resulting in the deficits listed below (see PT Problem List).  Pt will benefit from acute skilled PT to increase their independence and safety with mobility to allow discharge. Pt sitting EOB on arrival with nurse tech.  Pt reports concerns with edema in left LE and need for constant ice.  Pt educated on elevation while maintaining knee extension and repositioned end of session with ice in place.  Pt only able to ambulate short distance due to pain.  Pt reports living alone and has steps to enter home.  Pt states he plans to d/c to SNF and reports he has already spoken with [facility].           If plan is discharge home, recommend the following: A little help with walking and/or transfers;A little help with bathing/dressing/bathroom;Assistance with cooking/housework;Assist for transportation;Help with stairs or ramp for entrance   Can travel by private vehicle        Equipment Recommendations Rolling walker (2 wheels)  Recommendations for Other Services       Functional Status Assessment Patient has had a recent decline in their functional status and demonstrates the ability to make significant improvements in function in a reasonable and predictable amount of time.     Precautions / Restrictions Precautions Precautions: Fall;Knee Restrictions Weight Bearing Restrictions Per Provider Order: No Other Position/Activity Restrictions: WBAT      Mobility  Bed Mobility Overal bed mobility: Needs Assistance Bed Mobility: Sit to Supine       Sit to supine: Min assist   General bed mobility comments: assist for L LE    Transfers Overall transfer level: Needs assistance Equipment used: Rolling walker (2  wheels) Transfers: Sit to/from Stand Sit to Stand: Min assist           General transfer comment: verbal cues for UE and LE positioning for pain control    Ambulation/Gait Ambulation/Gait assistance: Contact guard assist Gait Distance (Feet): 28 Feet Assistive device: Rolling walker (2 wheels) Gait Pattern/deviations: Step-to pattern, Decreased stance time - left, Antalgic Gait velocity: decr     General Gait Details: verbal cues for sequence, RW positioning, step length, safety; pain limiting distance  Stairs            Wheelchair Mobility     Tilt Bed    Modified Rankin (Stroke Patients Only)       Balance                                             Pertinent Vitals/Pain Pain Assessment Pain Assessment: 0-10 Pain Score: 7  Pain Location: left knee and thigh Pain Descriptors / Indicators: Sore, Tightness, Guarding, Grimacing Pain Intervention(s): Monitored during session, Repositioned, Ice applied    Home Living Family/patient expects to be discharged to:: Private residence Living Arrangements: Alone   Type of Home: House Home Access: Stairs to enter Entrance Stairs-Rails: Lawyer of Steps: 2   Home Layout: One level Home Equipment: None      Prior Function Prior Level of Function : Independent/Modified Independent  Extremity/Trunk Assessment        Lower Extremity Assessment Lower Extremity Assessment: LLE deficits/detail LLE Deficits / Details: unable to perform SLR, edematous knee and thigh - bed positioned to assist with edema and maintain knee extension, ice applied to knee end of session LLE: Unable to fully assess due to pain       Communication   Communication Communication: No apparent difficulties    Cognition Arousal: Alert Behavior During Therapy: WFL for tasks assessed/performed   PT - Cognitive impairments: No apparent impairments                          Following commands: Intact       Cueing       General Comments      Exercises     Assessment/Plan    PT Assessment Patient needs continued PT services  PT Problem List Decreased range of motion;Decreased strength;Pain;Decreased activity tolerance;Decreased balance;Decreased mobility;Decreased knowledge of precautions;Decreased knowledge of use of DME       PT Treatment Interventions DME instruction;Gait training;Balance training;Functional mobility training;Therapeutic activities;Therapeutic exercise;Stair training;Patient/family education    PT Goals (Current goals can be found in the Care Plan section)  Acute Rehab PT Goals PT Goal Formulation: With patient Time For Goal Achievement: 09/09/23 Potential to Achieve Goals: Good    Frequency 7X/week     Co-evaluation               AM-PAC PT "6 Clicks" Mobility  Outcome Measure Help needed turning from your back to your side while in a flat bed without using bedrails?: A Little Help needed moving from lying on your back to sitting on the side of a flat bed without using bedrails?: A Little Help needed moving to and from a bed to a chair (including a wheelchair)?: A Little Help needed standing up from a chair using your arms (e.g., wheelchair or bedside chair)?: A Little Help needed to walk in hospital room?: A Little Help needed climbing 3-5 steps with a railing? : A Lot 6 Click Score: 17    End of Session Equipment Utilized During Treatment: Gait belt Activity Tolerance: Patient tolerated treatment well Patient left: with call bell/phone within reach;in bed;with bed alarm set Nurse Communication: Mobility status PT Visit Diagnosis: Difficulty in walking, not elsewhere classified (R26.2);Pain Pain - Right/Left: Left Pain - part of body: Knee    Time: 1610-9604 PT Time Calculation (min) (ACUTE ONLY): 14 min   Charges:   PT Evaluation $PT Eval Low Complexity: 1 Low   PT General  Charges $$ ACUTE PT VISIT: 1 Visit        Thomasene Mohair PT, DPT Physical Therapist Acute Rehabilitation Services Office: [phone]   Kati L Payson 09/02/2023, 11:28 AM

## 2023-09-03 DIAGNOSIS — M1712 Unilateral primary osteoarthritis, left knee: Secondary | ICD-10-CM | POA: Diagnosis not present

## 2023-09-03 LAB — GLUCOSE, CAPILLARY
Glucose-Capillary: 134 mg/dL — ABNORMAL HIGH (ref 70–99)
Glucose-Capillary: 125 mg/dL — ABNORMAL HIGH (ref 70–99)
Glucose-Capillary: 148 mg/dL — ABNORMAL HIGH (ref 70–99)
Glucose-Capillary: 130 mg/dL — ABNORMAL HIGH (ref 70–99)

## 2023-09-03 LAB — CBC
HCT: 30 % — ABNORMAL LOW (ref 39.0–52.0)
Hemoglobin: 10.3 g/dL — ABNORMAL LOW (ref 13.0–17.0)
MCH: 32.5 pg (ref 26.0–34.0)
MCHC: 34.3 g/dL (ref 30.0–36.0)
MCV: 94.6 fL (ref 80.0–100.0)
Platelets: 198 10*3/uL (ref 150–400)
RBC: 3.17 MIL/uL — ABNORMAL LOW (ref 4.22–5.81)
RDW: 12.3 % (ref 11.5–15.5)
WBC: 14.8 10*3/uL — ABNORMAL HIGH (ref 4.0–10.5)
nRBC: 0 % (ref 0.0–0.2)

## 2023-09-03 MED ORDER — METHOCARBAMOL 500 MG PO TABS: 500.0000 mg | ORAL_TABLET | Freq: Four times a day (QID) | ORAL | 0 refills | Status: DC | PRN

## 2023-09-03 MED ORDER — POLYETHYLENE GLYCOL 3350 17 G PO PACK: 17.0000 g | PACK | Freq: Every day | ORAL | Status: AC | PRN

## 2023-09-03 MED ORDER — ASPIRIN 81 MG PO CHEW: 81.0000 mg | CHEWABLE_TABLET | Freq: Two times a day (BID) | ORAL | 0 refills | Status: AC

## 2023-09-03 MED ORDER — OXYCODONE HCL 5 MG PO TABS: 5.0000 mg | ORAL_TABLET | ORAL | 0 refills | Status: AC | PRN

## 2023-09-03 MED ORDER — ONDANSETRON HCL 4 MG PO TABS: 4.0000 mg | ORAL_TABLET | Freq: Three times a day (TID) | ORAL | 0 refills | Status: AC | PRN

## 2023-09-03 MED ORDER — ACETAMINOPHEN 500 MG PO TABS
1000.0000 mg | ORAL_TABLET | Freq: Three times a day (TID) | ORAL | 0 refills | Status: AC
Start: 2023-09-03 — End: 2023-10-03

## 2023-09-03 NOTE — NC FL2 (Signed)
 Page MEDICAID FL2 LEVEL OF CARE FORM     IDENTIFICATION  Patient Name: Sean [name] Birthdate: 07/21/1960 Sex: male Admission Date (Current Location): 09/01/2023  [facility] and [place] Number:  Producer, television/film/video and Address:  [facility],  [address] [place] [address]      Provider Number: [id number]  Attending Physician Name and Address:  Samson Frederic, MD  Relative Name and Phone Number:       Current Level of Care: Hospital Recommended Level of Care: Skilled Nursing Facility Prior Approval Number:    Date Approved/Denied:   PASRR Number: [id number]  Discharge Plan: SNF    Current Diagnoses: Patient Active Problem List   Diagnosis Date Noted   Degenerative arthritis of left knee 09/01/2023   Avascular necrosis of bone of hip, right (HCC) 11/26/2021   Controlled type 2 diabetes mellitus without complication, without long-term current use of insulin (HCC) 11/26/2021   HTN (hypertension) 11/26/2021   Obesity, Class III, BMI 40-49.9 (morbid obesity) (HCC) 11/26/2021   Impaired ambulation 11/26/2021   PULMONARY EMBOLISM, HX OF 04/19/2008    Orientation RESPIRATION BLADDER Height & Weight     Self, Time, Situation, Place  Normal Continent Weight: 238 lb 1.6 oz (108 kg) Height:  5\' 6"  (167.6 cm)  BEHAVIORAL SYMPTOMS/MOOD NEUROLOGICAL BOWEL NUTRITION STATUS      Continent Diet (regular)  AMBULATORY STATUS COMMUNICATION OF NEEDS Skin   Limited Assist Verbally Other (Comment) (surgical incision only)                       Personal Care Assistance Level of Assistance  Bathing, Dressing Bathing Assistance: Limited assistance   Dressing Assistance: Limited assistance     Functional Limitations Info  Sight, Hearing, Speech Sight Info: Adequate Hearing Info: Adequate Speech Info: Adequate    SPECIAL CARE FACTORS FREQUENCY  PT (By licensed PT), OT (By licensed OT)     PT Frequency: 5x/wk OT Frequency: 5x/wk             Contractures Contractures Info: Not present    Additional Factors Info  Code Status, Allergies Code Status Info: Full Allergies Info: NKDA           Current Medications (09/03/2023):  This is the current hospital active medication list Current Facility-Administered Medications  Medication Dose Route Frequency Provider Last Rate Last Admin   0.9 %  sodium chloride infusion   Intravenous Continuous Swinteck, Arlys John, MD 150 mL/hr at 09/02/23 0033 New Bag at 09/02/23 0033   acetaminophen (TYLENOL) tablet 325-650 mg  325-650 mg Oral Q6H PRN Samson Frederic, MD   650 mg at 09/03/23 0809   alum & mag hydroxide-simeth (MAALOX/MYLANTA) 200-200-20 MG/5ML suspension 30 mL  30 mL Oral Q4H PRN Swinteck, Arlys John, MD       aspirin chewable tablet 81 mg  81 mg Oral BID Samson Frederic, MD   81 mg at 09/03/23 1027   diphenhydrAMINE (BENADRYL) 12.5 MG/5ML elixir 12.5-25 mg  12.5-25 mg Oral Q4H PRN Swinteck, Arlys John, MD       docusate sodium (COLACE) capsule 100 mg  100 mg Oral BID Samson Frederic, MD   100 mg at 09/03/23 1027   DULoxetine (CYMBALTA) DR capsule 30 mg  30 mg Oral q morning Swinteck, Arlys John, MD   30 mg at 09/03/23 1027   dutasteride (AVODART) capsule 0.5 mg  0.5 mg Oral q AM Swinteck, Arlys John, MD   0.5 mg at 09/03/23 1027   FLUoxetine (PROZAC)  capsule 40 mg  40 mg Oral q morning Samson Frederic, MD   40 mg at 09/03/23 1027   gabapentin (NEURONTIN) capsule 400 mg  400 mg Oral q AM Swinteck, Arlys John, MD   400 mg at 09/03/23 1027   HYDROmorphone (DILAUDID) injection 0.5-1 mg  0.5-1 mg Intravenous Q4H PRN Swinteck, Arlys John, MD       insulin aspart (novoLOG) injection 0-5 Units  0-5 Units Subcutaneous QHS Swinteck, Arlys John, MD       insulin aspart (novoLOG) injection 0-9 Units  0-9 Units Subcutaneous TID WC Samson Frederic, MD   1 Units at 09/03/23 0810   menthol-cetylpyridinium (CEPACOL) lozenge 3 mg  1 lozenge Oral PRN Swinteck, Arlys John, MD       Or   phenol (CHLORASEPTIC) mouth spray 1 spray  1 spray  Mouth/Throat PRN Swinteck, Arlys John, MD       metFORMIN (GLUCOPHAGE) tablet 500 mg  500 mg Oral Q breakfast Swinteck, Arlys John, MD   500 mg at 09/03/23 0810   methocarbamol (ROBAXIN) tablet 500 mg  500 mg Oral Q6H PRN Samson Frederic, MD   500 mg at 09/03/23 0449   Or   methocarbamol (ROBAXIN) injection 500 mg  500 mg Intravenous Q6H PRN Swinteck, Arlys John, MD       metoCLOPramide (REGLAN) tablet 5-10 mg  5-10 mg Oral Q8H PRN Swinteck, Arlys John, MD       Or   metoCLOPramide (REGLAN) injection 5-10 mg  5-10 mg Intravenous Q8H PRN Swinteck, Arlys John, MD       ondansetron [facility]) tablet 4 mg  4 mg Oral Q6H PRN Swinteck, Arlys John, MD       Or   ondansetron (ZOFRAN) injection 4 mg  4 mg Intravenous Q6H PRN Swinteck, Arlys John, MD       oxyCODONE (Oxy IR/ROXICODONE) immediate release tablet 10-15 mg  10-15 mg Oral Q4H PRN Swinteck, Arlys John, MD   10 mg at 09/03/23 0515   oxyCODONE (Oxy IR/ROXICODONE) immediate release tablet 5-10 mg  5-10 mg Oral Q4H PRN Samson Frederic, MD   10 mg at 09/03/23 1025   pantoprazole (PROTONIX) EC tablet 40 mg  40 mg Oral Daily Swinteck, Arlys John, MD   40 mg at 09/03/23 1027   polyethylene glycol (MIRALAX / GLYCOLAX) packet 17 g  17 g Oral Daily PRN Samson Frederic, MD   17 g at 09/03/23 0818   rosuvastatin (CRESTOR) tablet 20 mg  20 mg Oral q AM Swinteck, Arlys John, MD   20 mg at 09/03/23 1027   tamsulosin (FLOMAX) capsule 0.4 mg  0.4 mg Oral Daily Samson Frederic, MD   0.4 mg at 09/03/23 1027     Discharge Medications: Please see discharge summary for a list of discharge medications.  Relevant Imaging Results:  Relevant Lab Results:   Additional Information SSN: [id number].  Ormand Senn, LCSW

## 2023-09-03 NOTE — TOC Initial Note (Signed)
 Transition of Care [facility]) - Initial/Assessment Note    Patient Details  Name: Sean [name] MRN: [id number] Date of Birth: 09-12-1960  [facility] CM/SW Contact:    Amada Jupiter, LCSW Phone Number: 09/03/2023, 4:22 PM  Clinical Narrative:                  Met with pt yesterday and today to review dc plans.  Pt very clearly reports dc plan is to return to [facility] as he did after prior surgery.  Have been able to confirm with facility that they can accept him back.  At this point, insurance authorization for SNF is still pending but hopeful will have this come through over weekend.  Facility can admit over weekend.  MD aware and will alert weekend TOC coverage.  Expected Discharge Plan: Skilled Nursing Facility Barriers to Discharge: Insurance Authorization   Patient Goals and CMS Choice Patient states their goals for this hospitalization and ongoing recovery are:: return to [facility]          Expected Discharge Plan and Services In-house Referral: Clinical Social Work     Living arrangements for the past 2 months: Single Family Home Expected Discharge Date: 09/03/23               DME Arranged: N/A DME Agency: NA                  Prior Living Arrangements/Services Living arrangements for the past 2 months: Single Family Home Lives with:: Self Patient language and need for interpreter reviewed:: Yes Do you feel safe going back to the place where you live?: Yes      Need for Family Participation in Patient Care: No (Comment) Care giver support system in place?: No (comment)   Criminal Activity/Legal Involvement Pertinent to Current Situation/Hospitalization: No - Comment as needed  Activities of Daily Living   ADL Screening (condition at time of admission) Independently performs ADLs?: Yes (appropriate for developmental age) Is the patient deaf or have difficulty hearing?: No Does the patient have difficulty seeing, even when wearing  glasses/contacts?: No Does the patient have difficulty concentrating, remembering, or making decisions?: No  Permission Sought/Granted Permission sought to share information with : Facility Industrial/product designer granted to share information with : Yes, Verbal Permission Granted     Permission granted to share info w AGENCY: SNFs        Emotional Assessment Appearance:: Appears stated age Attitude/Demeanor/Rapport: Engaged Affect (typically observed): Accepting Orientation: : Oriented to Self, Oriented to Place, Oriented to  Time, Oriented to Situation Alcohol / Substance Use: Not Applicable Psych Involvement: No (comment)  Admission diagnosis:  Degenerative arthritis of left knee [M17.12] Patient Active Problem List   Diagnosis Date Noted   Degenerative arthritis of left knee 09/01/2023   Avascular necrosis of bone of hip, right (HCC) 11/26/2021   Controlled type 2 diabetes mellitus without complication, without long-term current use of insulin (HCC) 11/26/2021   HTN (hypertension) 11/26/2021   Obesity, Class III, BMI 40-49.9 (morbid obesity) (HCC) 11/26/2021   Impaired ambulation 11/26/2021   PULMONARY EMBOLISM, HX OF 04/19/2008   PCP:  [facility]:   [facility] - [place], [place] - [address] [place] [address] Phone: [phone] Fax: [phone]     Social Drivers of Health (SDOH) Social History: SDOH Screenings   Food Insecurity: No Food Insecurity (09/02/2023)  Housing: Low Risk  (09/02/2023)  Transportation Needs: No Transportation Needs (  09/02/2023)  Utilities: Not At Risk (09/02/2023)  Social Connections: Socially Isolated (09/02/2023)  Tobacco Use: High Risk (09/01/2023)   SDOH Interventions:     Readmission Risk Interventions     No data to display

## 2023-09-03 NOTE — Plan of Care (Signed)
 Problem: Clinical Measurements: Goal: Ability to maintain clinical measurements within normal limits will improve Outcome: Progressing   Problem: Activity: Goal: Risk for activity intolerance will decrease Outcome: Progressing   Problem: Coping: Goal: Level of anxiety will decrease Outcome: Progressing   Problem: Pain Managment: Goal: General experience of comfort will improve and/or be controlled Outcome: Progressing   Problem: Safety: Goal: Ability to remain free from injury will improve Outcome: Progressing   Haydee Salter, RN 09/03/23 6:59 PM

## 2023-09-03 NOTE — Progress Notes (Signed)
 Physical Therapy Treatment Patient Details Name: Sean [name] MRN: [id number] DOB: Nov 12, 1960 Today's Date: 09/03/2023   History of Present Illness Pt is a 63 year old male s/p L TKA on 09/01/23.  PMHx: R THA 11/28/2021, DM, dyspnea, HTN    PT Comments  Pt resting in bed with left knee in flexion and hip externally rotated. Pt educated again to keep "knee cap" up toward ceiling and knee straight at rest.  Pt ambulated short distance in hallway but limited by "stiffness" and pain.  Pt agreeable to remain OOB in recliner end of session.     If plan is discharge home, recommend the following: A little help with walking and/or transfers;A little help with bathing/dressing/bathroom;Assistance with cooking/housework;Assist for transportation;Help with stairs or ramp for entrance   Can travel by private vehicle        Equipment Recommendations  Rolling walker (2 wheels)    Recommendations for Other Services       Precautions / Restrictions Precautions Precautions: Fall;Knee Restrictions LLE Weight Bearing Per Provider Order: Weight bearing as tolerated     Mobility  Bed Mobility Overal bed mobility: Needs Assistance Bed Mobility: Supine to Sit     Supine to sit: Contact guard     General bed mobility comments: cues for self assist for L LE    Transfers Overall transfer level: Needs assistance Equipment used: Rolling walker (2 wheels) Transfers: Sit to/from Stand Sit to Stand: Contact guard assist, From elevated surface           General transfer comment: verbal cues for UE and LE positioning for pain control    Ambulation/Gait Ambulation/Gait assistance: Contact guard assist Gait Distance (Feet): 40 Feet Assistive device: Rolling walker (2 wheels) Gait Pattern/deviations: Step-to pattern, Decreased stance time - left, Antalgic Gait velocity: decr     General Gait Details: verbal cues for sequence, RW positioning, step length, safety; pt only weight bearing on left  forefoot with increased knee flexion throughout - reports pain and stiffness limiting   Stairs             Wheelchair Mobility     Tilt Bed    Modified Rankin (Stroke Patients Only)       Balance                                            Communication Communication Communication: No apparent difficulties  Cognition Arousal: Alert Behavior During Therapy: WFL for tasks assessed/performed   PT - Cognitive impairments: No apparent impairments                         Following commands: Intact      Cueing    Exercises      General Comments        Pertinent Vitals/Pain Pain Assessment Pain Assessment: 0-10 Pain Score: 6  Pain Location: left knee and thigh Pain Descriptors / Indicators: Sore, Tightness, Guarding, Grimacing Pain Intervention(s): Repositioned, Monitored during session, Ice applied    Home Living                          Prior Function            PT Goals (current goals can now be found in the care plan section) Progress towards PT goals: Progressing toward goals  Frequency    7X/week      PT Plan      Co-evaluation              AM-PAC PT "6 Clicks" Mobility   Outcome Measure  Help needed turning from your back to your side while in a flat bed without using bedrails?: A Little Help needed moving from lying on your back to sitting on the side of a flat bed without using bedrails?: A Little Help needed moving to and from a bed to a chair (including a wheelchair)?: A Little Help needed standing up from a chair using your arms (e.g., wheelchair or bedside chair)?: A Little Help needed to walk in hospital room?: A Little Help needed climbing 3-5 steps with a railing? : A Lot 6 Click Score: 17    End of Session Equipment Utilized During Treatment: Gait belt Activity Tolerance: Patient limited by pain Patient left: in chair;with call bell/phone within reach;with chair alarm  set Nurse Communication: Mobility status PT Visit Diagnosis: Difficulty in walking, not elsewhere classified (R26.2);Pain Pain - Right/Left: Left Pain - part of body: Knee     Time: 0950-1000 PT Time Calculation (min) (ACUTE ONLY): 10 min  Charges:    $Gait Training: 8-22 mins PT General Charges $$ ACUTE PT VISIT: 1 Visit                    Sean [name], DPT Physical Therapist Acute Rehabilitation Services Office: [phone]    Sean [name] [place] 09/03/2023, 11:49 AM

## 2023-09-03 NOTE — Discharge Summary (Addendum)
 Physician Discharge Summary  Patient ID: ABHI MOCCIA MRN: [id number] DOB/AGE: March 24, 1961 63 y.o.  Admit date: 09/01/2023 Discharge date: 09/04/2023  Admission Diagnoses:  Degenerative arthritis of left knee  Discharge Diagnoses:  Principal Problem:   Degenerative arthritis of left knee   Past Medical History:  Diagnosis Date   Arthritis    Asthma    Depression    Diabetes mellitus without complication (HCC)    Dyspnea    Hypertension    Psychiatric problem     Surgeries: Procedure(s): LEFT ARTHROPLASTY, KNEE, TOTAL, USING IMAGELESS COMPUTER-ASSISTED NAVIGATION on 09/01/2023   Consultants (if any):   Discharged Condition: Improved  Hospital Course: MEET WEATHINGTON is an 63 y.o. male who was admitted 09/01/2023 with a diagnosis of Degenerative arthritis of left knee and went to the operating room on 09/01/2023 and underwent the above named procedures.    He was given perioperative antibiotics:  Anti-infectives (From admission, onward)    Start     Dose/Rate Route Frequency Ordered Stop   09/02/23 0600  ceFAZolin (ANCEF) IVPB 2g/100 mL premix        2 g 200 mL/hr over 30 Minutes Intravenous On call to O.R. 09/01/23 1204 09/02/23 0630   09/01/23 1900  ceFAZolin (ANCEF) IVPB 2g/100 mL premix        2 g 200 mL/hr over 30 Minutes Intravenous Every 6 hours 09/01/23 1855 09/02/23 0106   09/01/23 1856  ceFAZolin (ANCEF) 2-4 GM/100ML-% IVPB       Note to Pharmacy: Sandie Ano H: cabinet override      09/01/23 1856 09/01/23 1900       He was given sequential compression devices, early ambulation, and ASA for DVT prophylaxis.  He benefited maximally from the hospital stay and there were no complications.    Recent vital signs:  Vitals:   09/03/23 2041 09/04/23 0430  BP: (!) 167/91 (!) 132/93  Pulse: 80   Resp: 15 16  Temp: 98.7 F (37.1 C) 98.3 F (36.8 C)  SpO2: 97% 97%    Recent laboratory studies:  Lab Results  Component Value Date   HGB 10.3 (L) 09/03/2023    HGB 11.1 (L) 09/02/2023   HGB 14.0 08/27/2023   Lab Results  Component Value Date   WBC 14.8 (H) 09/03/2023   PLT 198 09/03/2023   Lab Results  Component Value Date   INR 2.4 04/19/2008   Lab Results  Component Value Date   NA 133 (L) 09/02/2023   K 4.9 09/02/2023   CL 105 09/02/2023   CO2 21 (L) 09/02/2023   BUN 22 (H) 09/02/2023   CREATININE 0.90 09/02/2023   GLUCOSE 148 (H) 09/02/2023     Allergies as of 09/04/2023   No Known Allergies      Medication List     TAKE these medications    acetaminophen 500 MG tablet Commonly known as: TYLENOL Take 2 tablets (1,000 mg total) by mouth every 8 (eight) hours.   aspirin 81 MG chewable tablet Chew 1 tablet (81 mg total) by mouth 2 (two) times daily.   chlorhexidine 4 % external liquid Commonly known as: HIBICLENS Apply 15 mLs (1 Application total) topically as directed for 30 doses. Use as directed daily for 5 days every other week for 6 weeks.   cyclobenzaprine 10 MG tablet Commonly known as: FLEXERIL Take 10 mg by mouth at bedtime.   DULoxetine 30 MG capsule Commonly known as: CYMBALTA Take 30 mg by mouth every morning.   dutasteride  0.5 MG capsule Commonly known as: AVODART Take 0.5 mg by mouth in the morning.   FLUoxetine 40 MG capsule Commonly known as: PROZAC Take 40 mg by mouth every morning.   gabapentin 400 MG capsule Commonly known as: NEURONTIN Take 400 mg by mouth in the morning.   ICY HOT EX Apply 1 application. topically 2 (two) times daily as needed (pain).   lisinopril-hydrochlorothiazide 20-25 MG tablet Commonly known as: ZESTORETIC Take 1 tablet by mouth every morning.   meloxicam 15 MG tablet Commonly known as: MOBIC Take 15 mg by mouth in the morning.   metFORMIN 500 MG tablet Commonly known as: GLUCOPHAGE Take 500 mg by mouth in the morning and at bedtime.   methocarbamol 500 MG tablet Commonly known as: ROBAXIN Take 1 tablet (500 mg total) by mouth every 6 (six) hours as  needed for muscle spasms.   mupirocin ointment 2 % Commonly known as: BACTROBAN Place 1 Application into the nose 2 (two) times daily for 60 doses. Use as directed 2 times daily for 5 days every other week for 6 weeks.   omeprazole 20 MG capsule Commonly known as: PRILOSEC Take 20 mg by mouth daily as needed (indigestion/heartburn.).   ondansetron 4 MG tablet Commonly known as: Zofran Take 1 tablet (4 mg total) by mouth every 8 (eight) hours as needed for nausea or vomiting.   oxyCODONE 5 MG immediate release tablet Commonly known as: Oxy IR/ROXICODONE Take 1 tablet (5 mg total) by mouth every 4 (four) hours as needed for up to 7 days for moderate pain (pain score 4-6) (pain score 4-6).   polyethylene glycol 17 g packet Commonly known as: MiraLax Take 17 g by mouth daily as needed for moderate constipation or severe constipation.   rosuvastatin 20 MG tablet Commonly known as: CRESTOR Take 20 mg by mouth in the morning.   tamsulosin 0.4 MG Caps capsule Commonly known as: FLOMAX Take 1 capsule (0.4 mg total) by mouth daily. What changed: when to take this          WEIGHT BEARING   Weight bearing as tolerated with assist device (walker, cane, etc) as directed, use it as long as suggested by your surgeon or therapist, typically at least 4-6 weeks.   EXERCISES  Results after joint replacement surgery are often greatly improved when you follow the exercise, range of motion and muscle strengthening exercises prescribed by your doctor. Safety measures are also important to protect the joint from further injury. Any time any of these exercises cause you to have increased pain or swelling, decrease what you are doing until you are comfortable again and then slowly increase them. If you have problems or questions, call your caregiver or physical therapist for advice.   Rehabilitation is important following a joint replacement. After just a few days of immobilization, the muscles of  the leg can become weakened and shrink (atrophy).  These exercises are designed to build up the tone and strength of the thigh and leg muscles and to improve motion. Often times heat used for twenty to thirty minutes before working out will loosen up your tissues and help with improving the range of motion but do not use heat for the first two weeks following surgery (sometimes heat can increase post-operative swelling).   These exercises can be done on a training (exercise) mat, on the floor, on a table or on a bed. Use whatever works the best and is most comfortable for you.    Use  music or television while you are exercising so that the exercises are a pleasant break in your day. This will make your life better with the exercises acting as a break in your routine that you can look forward to.   Perform all exercises about fifteen times, three times per day or as directed.  You should exercise both the operative leg and the other leg as well.  Exercises include:   Quad Sets - Tighten up the muscle on the front of the thigh (Quad) and hold for 5-10 seconds.   Straight Leg Raises - With your knee straight (if you were given a brace, keep it on), lift the leg to 60 degrees, hold for 3 seconds, and slowly lower the leg.  Perform this exercise against resistance later as your leg gets stronger.  Leg Slides: Lying on your back, slowly slide your foot toward your buttocks, bending your knee up off the floor (only go as far as is comfortable). Then slowly slide your foot back down until your leg is flat on the floor again.  Angel Wings: Lying on your back spread your legs to the side as far apart as you can without causing discomfort.  Hamstring Strength:  Lying on your back, push your heel against the floor with your leg straight by tightening up the muscles of your buttocks.  Repeat, but this time bend your knee to a comfortable angle, and push your heel against the floor.  You may put a pillow under the heel  to make it more comfortable if necessary.   A rehabilitation program following joint replacement surgery can speed recovery and prevent re-injury in the future due to weakened muscles. Contact your doctor or a physical therapist for more information on knee rehabilitation.    CONSTIPATION  Constipation is defined medically as fewer than three stools per week and severe constipation as less than one stool per week.  Even if you have a regular bowel pattern at home, your normal regimen is likely to be disrupted due to multiple reasons following surgery.  Combination of anesthesia, postoperative narcotics, change in appetite and fluid intake all can affect your bowels.   YOU MUST use at least one of the following options; they are listed in order of increasing strength to get the job done.  They are all available over the counter, and you may need to use some, POSSIBLY even all of these options:    Drink plenty of fluids (prune juice may be helpful) and high fiber foods Colace 100 mg by mouth twice a day  Senokot for constipation as directed and as needed Dulcolax (bisacodyl), take with full glass of water  Miralax (polyethylene glycol) once or twice a day as needed.  If you have tried all these things and are unable to have a bowel movement in the first 3-4 days after surgery call either your surgeon or your primary doctor.    If you experience loose stools or diarrhea, hold the medications until you stool forms back up.  If your symptoms do not get better within 1 week or if they get worse, check with your doctor.  If you experience "the worst abdominal pain ever" or develop nausea or vomiting, please contact the office immediately for further recommendations for treatment.   ITCHING:  If you experience itching with your medications, try taking only a single pain pill, or even half a pain pill at a time.  You can also use Benadryl over the counter for itching  or also to help with sleep.   TED  HOSE STOCKINGS:  Use stockings on both legs until for at least 2 weeks or as directed by physician office. They may be removed at night for sleeping.  MEDICATIONS:  See your medication summary on the "After Visit Summary" that nursing will review with you.  You may have some home medications which will be placed on hold until you complete the course of blood thinner medication.  It is important for you to complete the blood thinner medication as prescribed.  PRECAUTIONS:  If you experience chest pain or shortness of breath - call 911 immediately for transfer to the hospital emergency department.   If you develop a fever greater that 101 F, purulent drainage from wound, increased redness or drainage from wound, foul odor from the wound/dressing, or calf pain - CONTACT YOUR SURGEON.                                                   FOLLOW-UP APPOINTMENTS:  If you do not already have a post-op appointment, please call the office for an appointment to be seen by your surgeon.  Guidelines for how soon to be seen are listed in your "After Visit Summary", but are typically between 1-4 weeks after surgery.  OTHER INSTRUCTIONS:   Knee Replacement:  Do not place pillow under knee, focus on keeping the knee straight while resting. CPM instructions: 0-90 degrees, 2 hours in the morning, 2 hours in the afternoon, and 2 hours in the evening. Place foam block, curve side up under heel at all times except when in CPM or when walking.  DO NOT modify, tear, cut, or change the foam block in any way.   MAKE SURE YOU:  Understand these instructions.  Get help right away if you are not doing well or get worse.    Thank you for letting us be a part of your medical care team.  It is a privilege we respect greatly.  We hope these instructions will help you stay on track for a fast and full recovery!   Diagnostic Studies: DG Knee Left Port Result Date: 09/01/2023 CLINICAL DATA:  Knee replacement EXAM: PORTABLE LEFT KNEE  - 1-2 VIEW COMPARISON:  None Available. FINDINGS: Status post knee replacement with intact hardware and normal alignment. Gas in the soft tissues consistent with recent surgery. No fracture IMPRESSION: Knee replacement with expected postsurgical change Electronically Signed   By: Jasmine Pang M.D.   On: 09/01/2023 21:55    Disposition: Discharge disposition: 03-Skilled Nursing Facility       Discharge Instructions     Call MD / Call 911   Complete by: As directed    If you experience chest pain or shortness of breath, CALL 911 and be transported to the hospital emergency room.  If you develope a fever above 101 F, pus (white drainage) or increased drainage or redness at the wound, or calf pain, call your surgeon's office.   Constipation Prevention   Complete by: As directed    Drink plenty of fluids.  Prune juice may be helpful.  You may use a stool softener, such as Colace (over the counter) 100 mg twice a day.  Use MiraLax (over the counter) for constipation as needed.   Diet - low sodium heart healthy   Complete by: As  directed    Do not put a pillow under the knee. Place it under the heel.   Complete by: As directed    Driving restrictions   Complete by: As directed    No driving for 6 weeks   Increase activity slowly as tolerated   Complete by: As directed    Lifting restrictions   Complete by: As directed    No lifting for 6 weeks   Post-operative opioid taper instructions:   Complete by: As directed    POST-OPERATIVE OPIOID TAPER INSTRUCTIONS: It is important to wean off of your opioid medication as soon as possible. If you do not need pain medication after your surgery it is ok to stop day one. Opioids include: Codeine, Hydrocodone(Norco, Vicodin), Oxycodone(Percocet, oxycontin) and hydromorphone amongst others.  Long term and even short term use of opiods can cause: Increased pain response Dependence Constipation Depression Respiratory depression And more.   Withdrawal symptoms can include Flu like symptoms Nausea, vomiting And more Techniques to manage these symptoms Hydrate well Eat regular healthy meals Stay active Use relaxation techniques(deep breathing, meditating, yoga) Do Not substitute Alcohol to help with tapering If you have been on opioids for less than two weeks and do not have pain than it is ok to stop all together.  Plan to wean off of opioids This plan should start within one week post op of your joint replacement. Maintain the same interval or time between taking each dose and first decrease the dose.  Cut the total daily intake of opioids by one tablet each day Next start to increase the time between doses. The last dose that should be eliminated is the evening dose.      TED hose   Complete by: As directed    Use stockings (TED hose) for 2 weeks on bilateral leg(s).  You may remove them at night for sleeping.        Contact information for after-discharge care     Destination     [facility] Preferred SNF .   Service: Skilled Nursing Contact information: [address] [place] [address] [phone]                      Signed: Jonette Pesa 09/04/2023, 7:59 AM

## 2023-09-03 NOTE — Progress Notes (Signed)
    Subjective:  Patient reports pain as mild to moderate.  Denies N/V/CP/SOB. C/o L knee pain.  Objective:   VITALS:   Vitals:   09/02/23 1743 09/02/23 2300 09/03/23 0650 09/03/23 1329  BP: (!) 154/80 (!) 156/78 (!) 162/84 (!) 158/75  Pulse: 95 76 74 88  Resp: 20 18 18 16   Temp: 98.1 F (36.7 C) 98.2 F (36.8 C) (!) 97.5 F (36.4 C) 97.6 F (36.4 C)  TempSrc: Oral     SpO2: 98% 97% 96% 99%  Weight:      Height:        NAD ABD soft Sensation intact distally Intact pulses distally Dorsiflexion/Plantar flexion intact Incision: dressing C/D/I Compartment soft   Lab Results  Component Value Date   WBC 14.8 (H) 09/03/2023   HGB 10.3 (L) 09/03/2023   HCT 30.0 (L) 09/03/2023   MCV 94.6 09/03/2023   PLT 198 09/03/2023   BMET    Component Value Date/Time   NA 133 (L) 09/02/2023 0326   K 4.9 09/02/2023 0326   CL 105 09/02/2023 0326   CO2 21 (L) 09/02/2023 0326   GLUCOSE 148 (H) 09/02/2023 0326   BUN 22 (H) 09/02/2023 0326   CREATININE 0.90 09/02/2023 0326   CALCIUM 8.9 09/02/2023 0326   GFRNONAA >60 09/02/2023 0326     Assessment/Plan: 2 Days Post-Op   Principal Problem:   Degenerative arthritis of left knee   WBAT with walker DVT ppx: Aspirin, SCDs, TEDS PO pain control PT/OT DM2: FSBS, metformin, SSI ABLA: asymptomatic, monitor Dispo: SNF placement: patient desires Sean [name] 09/03/2023, 2:01 PM   Sean Frederic, MD [phone] [facility] is now [facility]  [place] [address], [address], [place], [place] [address] Phone: [phone] [email or web address] Facebook  [employer]

## 2023-09-04 DIAGNOSIS — F17210 Nicotine dependence, cigarettes, uncomplicated: Secondary | ICD-10-CM | POA: Diagnosis not present

## 2023-09-04 DIAGNOSIS — M1712 Unilateral primary osteoarthritis, left knee: Secondary | ICD-10-CM | POA: Diagnosis not present

## 2023-09-04 DIAGNOSIS — Z7901 Long term (current) use of anticoagulants: Secondary | ICD-10-CM | POA: Diagnosis not present

## 2023-09-04 LAB — GLUCOSE, CAPILLARY
Glucose-Capillary: 132 mg/dL — ABNORMAL HIGH (ref 70–99)
Glucose-Capillary: 131 mg/dL — ABNORMAL HIGH (ref 70–99)
Glucose-Capillary: 166 mg/dL — ABNORMAL HIGH (ref 70–99)
Glucose-Capillary: 119 mg/dL — ABNORMAL HIGH (ref 70–99)

## 2023-09-04 NOTE — Plan of Care (Signed)

## 2023-09-04 NOTE — Progress Notes (Signed)
 Physical Therapy Treatment Patient Details Name: Sean [name] MRN: [id number] DOB: Jul 18, 1960 Today's Date: 09/04/2023   History of Present Illness Pt is a 63 year old male s/p L TKA on 09/01/23.  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  Pt states KI applied this morning but seemed uncertain of it's purpose.  Explained reasoning for KI likely to help prompt and maintain knee extension since pt tends to flex knee and ER leg at rest.  Pt ambulated in hallway however continues to report pain limiting distance.  Pt returned to bed and applied ice to left knee within KI.  Pt anticipates d/c to SNF.     If plan is discharge home, recommend the following: A little help with walking and/or transfers;A little help with bathing/dressing/bathroom;Assistance with cooking/housework;Assist for transportation;Help with stairs or ramp for entrance   Can travel by private vehicle        Equipment Recommendations  Rolling walker (2 wheels)    Recommendations for Other Services       Precautions / Restrictions Precautions Precautions: Fall;Knee Required Braces or Orthoses: Knee Immobilizer - Left Restrictions LLE Weight Bearing Per Provider Order: Weight bearing as tolerated     Mobility  Bed Mobility Overal bed mobility: Needs Assistance Bed Mobility: Supine to Sit, Sit to Supine     Supine to sit: Contact guard Sit to supine: Contact guard assist   General bed mobility comments: cues for self assist for L LE, increased time and effort    Transfers Overall transfer level: Needs assistance Equipment used: Rolling walker (2 wheels) Transfers: Sit to/from Stand Sit to Stand: Contact guard assist, From elevated surface           General transfer comment: verbal cues for UE and LE positioning for pain control    Ambulation/Gait Ambulation/Gait assistance: Contact guard assist Gait Distance (Feet): 50 Feet Assistive  device: Rolling walker (2 wheels) Gait Pattern/deviations: Step-to pattern, Decreased stance time - left, Antalgic Gait velocity: decr     General Gait Details: verbal cues for sequence, RW positioning, step length, safety; maintained KI today and pt with better weight bearing on left but still keeps heel a little lifted at times; pt continues to report pain limiting   Stairs             Wheelchair Mobility     Tilt Bed    Modified Rankin (Stroke Patients Only)       Balance                                            Communication Communication Communication: No apparent difficulties  Cognition Arousal: Alert Behavior During Therapy: WFL for tasks assessed/performed   PT - Cognitive impairments: No apparent impairments                         Following commands: Intact      Cueing    Exercises      General Comments        Pertinent Vitals/Pain Pain Assessment Pain Assessment: 0-10 Pain Score: 9  Pain Location: left knee and thigh Pain Descriptors / Indicators: Sore, Tightness, Guarding, Grimacing Pain Intervention(s): Repositioned, Monitored during session, Ice applied    Home Living  Prior Function            PT Goals (current goals can now be found in the care plan section) Progress towards PT goals: Progressing toward goals    Frequency    7X/week      PT Plan      Co-evaluation              AM-PAC PT "6 Clicks" Mobility   Outcome Measure  Help needed turning from your back to your side while in a flat bed without using bedrails?: A Little Help needed moving from lying on your back to sitting on the side of a flat bed without using bedrails?: A Little Help needed moving to and from a bed to a chair (including a wheelchair)?: A Little Help needed standing up from a chair using your arms (e.g., wheelchair or bedside chair)?: A Little Help needed to walk in  hospital room?: A Little Help needed climbing 3-5 steps with a railing? : A Lot 6 Click Score: 17    End of Session Equipment Utilized During Treatment: Gait belt Activity Tolerance: Patient limited by pain Patient left: with call bell/phone within reach;in bed;with bed alarm set   PT Visit Diagnosis: Difficulty in walking, not elsewhere classified (R26.2);Pain Pain - Right/Left: Left Pain - part of body: Knee     Time: 6578-4696 PT Time Calculation (min) (ACUTE ONLY): 19 min  Charges:    $Gait Training: 8-22 mins PT General Charges $$ ACUTE PT VISIT: 1 Visit                     Paulino Door, DPT Physical Therapist Acute Rehabilitation Services Office: [phone]    [name] 09/04/2023, 12:53 PM

## 2023-09-04 NOTE — Plan of Care (Signed)
   Problem: Activity: Goal: Risk for activity intolerance will decrease Outcome: Progressing   Problem: Nutrition: Goal: Adequate nutrition will be maintained Outcome: Progressing   Problem: Pain Managment: Goal: General experience of comfort will improve and/or be controlled Outcome: Progressing

## 2023-09-04 NOTE — Progress Notes (Signed)
    Subjective:  Patient reports pain as mild to moderate.  Denies N/V/CP/SOB. C/o L knee pain.  Objective:   VITALS:   Vitals:   09/03/23 0650 09/03/23 1329 09/03/23 2041 09/04/23 0430  BP: (!) 162/84 (!) 158/75 (!) 167/91 (!) 132/93  Pulse: 74 88 80   Resp: 18 16 15 16   Temp: (!) 97.5 F (36.4 C) 97.6 F (36.4 C) 98.7 F (37.1 C) 98.3 F (36.8 C)  TempSrc:   Oral Oral  SpO2: 96% 99% 97% 97%  Weight:      Height:       LLE:  NAD ABD soft Sensation intact distally Intact pulses distally Dorsiflexion/Plantar flexion intact Incision: dressing C/D/I Compartment soft   Lab Results  Component Value Date   WBC 14.8 (H) 09/03/2023   HGB 10.3 (L) 09/03/2023   HCT 30.0 (L) 09/03/2023   MCV 94.6 09/03/2023   PLT 198 09/03/2023   BMET    Component Value Date/Time   NA 133 (L) 09/02/2023 0326   K 4.9 09/02/2023 0326   CL 105 09/02/2023 0326   CO2 21 (L) 09/02/2023 0326   GLUCOSE 148 (H) 09/02/2023 0326   BUN 22 (H) 09/02/2023 0326   CREATININE 0.90 09/02/2023 0326   CALCIUM 8.9 09/02/2023 0326   GFRNONAA >60 09/02/2023 0326     Assessment/Plan: 3 Days Post-Op   Principal Problem:   Degenerative arthritis of left knee   WBAT with walker DVT ppx: Aspirin, SCDs, TEDS PO pain control PT/OT DM2: FSBS, metformin, SSI ABLA: asymptomatic, monitor Dispo: SNF placement: [facility]. Awaiting SNF approval . Meds printed ready for d/c once approved.    Arbie Cookey 09/04/2023, 7:50 AM

## 2023-09-04 NOTE — Progress Notes (Signed)
 Orthopedic Tech Progress Note Patient Details:  Sean [name] 05-08-1961 [id number]  Ortho Devices Type of Ortho Device: Knee Immobilizer Ortho Device/Splint Location: LLE Ortho Device/Splint Interventions: Ordered, Application, Adjustment   Post Interventions Patient Tolerated: Well Instructions Provided: Adjustment of device, Care of device  [name] 09/04/2023, 9:45 AM

## 2023-09-04 NOTE — Progress Notes (Signed)
   09/04/23 1416  Assess: MEWS Score  Temp 98.3 F (36.8 C)  BP 126/77  MAP (mmHg) 92  Pulse Rate (!) 128  Resp 16  Level of Consciousness Alert  SpO2 97 %  O2 Device Room Air  Assess: MEWS Score  MEWS Temp 0  MEWS Systolic 0  MEWS Pulse 2  MEWS RR 0  MEWS LOC 0  MEWS Score 2  MEWS Score Color Yellow  Assess: if the MEWS score is Yellow or Red  Were vital signs accurate and taken at a resting state? Yes  Does the patient meet 2 or more of the SIRS criteria? No  MEWS guidelines implemented  Yes, yellow  Treat  MEWS Interventions Considered administering scheduled or prn medications/treatments as ordered  Take Vital Signs  Increase Vital Sign Frequency  Yellow: Q2hr x1, continue Q4hrs until patient remains green for 12hrs  Escalate  MEWS: Escalate Yellow: Discuss with charge nurse and consider notifying provider and/or RRT  Notify: Charge Nurse/RN  Name of Charge Nurse/RN Notified Ardae  Provider Notification  Provider Name/Title Dion Saucier, [place]  Date Provider Notified 09/04/23  Time Provider Notified 1430  Method of Notification Page (secure chat)  Notification Reason Other (Comment) (pt hx PE)  Provider response Other (Comment) (notify if pt status change)  Date of Provider Response 09/04/23  Time of Provider Response 1435  Assess: SIRS CRITERIA  SIRS Temperature  0  SIRS Respirations  0  SIRS Pulse 1  SIRS WBC 0  SIRS Score Sum  1

## 2023-09-05 DIAGNOSIS — M1712 Unilateral primary osteoarthritis, left knee: Secondary | ICD-10-CM | POA: Diagnosis not present

## 2023-09-05 LAB — GLUCOSE, CAPILLARY
Glucose-Capillary: 118 mg/dL — ABNORMAL HIGH (ref 70–99)
Glucose-Capillary: 112 mg/dL — ABNORMAL HIGH (ref 70–99)
Glucose-Capillary: 139 mg/dL — ABNORMAL HIGH (ref 70–99)
Glucose-Capillary: 150 mg/dL — ABNORMAL HIGH (ref 70–99)

## 2023-09-05 NOTE — TOC Progression Note (Signed)
 Transition of Care [facility]) - Progression Note    Patient Details  Name: JOHNE BUCKLE MRN: [id number] Date of Birth: 01-23-1961  Transition of Care [facility]) CM/SW Contact  Epifanio Lesches, RN Phone Number: 09/05/2023, 10:28 AM  Clinical Narrative:    Awaiting insurance authorization for SNF rehab., under review with medical director.  TOC team following and will assist with needs...   Expected Discharge Plan: Skilled Nursing Facility Barriers to Discharge: Insurance Authorization  Expected Discharge Plan and Services In-house Referral: Clinical Social Work     Living arrangements for the past 2 months: Single Family Home Expected Discharge Date: 09/04/23               DME Arranged: N/A DME Agency: NA                   Social Determinants of Health (SDOH) Interventions SDOH Screenings   Food Insecurity: No Food Insecurity (09/02/2023)  Housing: Low Risk  (09/02/2023)  Transportation Needs: No Transportation Needs (09/02/2023)  Utilities: Not At Risk (09/02/2023)  Social Connections: Socially Isolated (09/02/2023)  Tobacco Use: High Risk (09/01/2023)    Readmission Risk Interventions     No data to display

## 2023-09-05 NOTE — Progress Notes (Signed)
 Patient ID: Sean [name], male   DOB: 17-Sep-1960, 63 y.o.   MRN: [id number] Subjective: 4 Days Post-Op Procedure(s) (LRB): LEFT ARTHROPLASTY, KNEE, TOTAL, USING IMAGELESS COMPUTER-ASSISTED NAVIGATION (Left)    Patient reports pain as mild to moderate Awaiting SNF placement due to lack of support at home No events noted or reported  Objective:   VITALS:   Vitals:   09/05/23 0001 09/05/23 0419  BP: 120/70 124/75  Pulse: 99 (!) 103  Resp: 18 18  Temp: 98.9 F (37.2 C) 98.8 F (37.1 C)  SpO2: 95% 100%    Neurovascular intact Incision: dressing C/D/I  LABS Recent Labs    09/03/23 0318  HGB 10.3*  HCT 30.0*  WBC 14.8*  PLT 198    No results for input(s): "NA", "K", "BUN", "CREATININE", "GLUCOSE" in the last 72 hours.  No results for input(s): "LABPT", "INR" in the last 72 hours.   Assessment/Plan: 4 Days Post-Op Procedure(s) (LRB): LEFT ARTHROPLASTY, KNEE, TOTAL, USING IMAGELESS COMPUTER-ASSISTED NAVIGATION (Left)   Up with therapy while in hospital Likely SNF tomorrow RTC in 2 weeks with [place]

## 2023-09-05 NOTE — Plan of Care (Signed)
   Problem: Coping: Goal: Level of anxiety will decrease Outcome: Progressing   Problem: Pain Managment: Goal: General experience of comfort will improve and/or be controlled Outcome: Progressing   Problem: Safety: Goal: Ability to remain free from injury will improve Outcome: Progressing

## 2023-09-05 NOTE — Progress Notes (Signed)
 Physical Therapy Treatment Patient Details Name: Sean [name] MRN: [id number] DOB: [birth year] Today's Date: 09/05/2023   History of Present Illness Pt is a 63 year old male s/p L TKA on 09/01/23.  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  Pt continues to reports significant pain and soreness, so he declined exercises.  Pt repositioned in recliner with KI and ice.  Pt awaiting SNF placement.     If plan is discharge home, recommend the following: A little help with walking and/or transfers;A little help with bathing/dressing/bathroom;Assistance with cooking/housework;Assist for transportation;Help with stairs or ramp for entrance   Can travel by private vehicle        Equipment Recommendations  Rolling walker (2 wheels)    Recommendations for Other Services       Precautions / Restrictions Precautions Precautions: Fall;Knee Required Braces or Orthoses: Knee Immobilizer - Left Restrictions LLE Weight Bearing Per Provider Order: Weight bearing as tolerated     Mobility  Bed Mobility Overal bed mobility: Needs Assistance Bed Mobility: Supine to Sit     Supine to sit: Supervision     General bed mobility comments: increased time and effort    Transfers Overall transfer level: Needs assistance Equipment used: Rolling walker (2 wheels) Transfers: Sit to/from Stand Sit to Stand: Min assist           General transfer comment: verbal cues for UE and LE positioning for pain control; assist to rise and control descent    Ambulation/Gait Ambulation/Gait assistance: Contact guard assist Gait Distance (Feet): 100 Feet Assistive device: Rolling walker (2 wheels) Gait Pattern/deviations: Step-to pattern, Decreased stance time - left, Antalgic Gait velocity: decr     General Gait Details: verbal cues for sequence, RW positioning, step length, safety; maintained KI today and pt with better  weight bearing on left but still keeps heel a little lifted at times; distance to tolerance   Stairs             Wheelchair Mobility     Tilt Bed    Modified Rankin (Stroke Patients Only)       Balance                                            Communication Communication Communication: No apparent difficulties  Cognition Arousal: Alert Behavior During Therapy: WFL for tasks assessed/performed   PT - Cognitive impairments: No apparent impairments                         Following commands: Intact      Cueing    Exercises      General Comments        Pertinent Vitals/Pain Pain Assessment Pain Assessment: 0-10 Pain Score: 8  Pain Location: left knee and thigh Pain Descriptors / Indicators: Sore, Tightness, Guarding, Grimacing Pain Intervention(s): Repositioned, Monitored during session, Ice applied    Home Living                          Prior Function            PT Goals (current goals can now be found in the care plan section) Progress towards PT goals: Progressing toward goals  Frequency    7X/week      PT Plan      Co-evaluation              AM-PAC PT "6 Clicks" Mobility   Outcome Measure  Help needed turning from your back to your side while in a flat bed without using bedrails?: A Little Help needed moving from lying on your back to sitting on the side of a flat bed without using bedrails?: A Little Help needed moving to and from a bed to a chair (including a wheelchair)?: A Little Help needed standing up from a chair using your arms (e.g., wheelchair or bedside chair)?: A Little Help needed to walk in hospital room?: A Little Help needed climbing 3-5 steps with a railing? : A Lot 6 Click Score: 17    End of Session Equipment Utilized During Treatment: Gait belt Activity Tolerance: Patient limited by pain Patient left: in chair;with call bell/phone within reach;with chair alarm  set Nurse Communication: Mobility status PT Visit Diagnosis: Difficulty in walking, not elsewhere classified (R26.2);Pain Pain - Right/Left: Left Pain - part of body: Knee     Time: 1610-9604 PT Time Calculation (min) (ACUTE ONLY): 14 min  Charges:    $Gait Training: 8-22 mins PT General Charges $$ ACUTE PT VISIT: 1 Visit                    Sean [name], DPT Physical Therapist Acute Rehabilitation Services Office: [phone]    Sean [name] [place] 09/05/2023, 12:43 PM

## 2023-09-06 DIAGNOSIS — M1712 Unilateral primary osteoarthritis, left knee: Secondary | ICD-10-CM | POA: Diagnosis not present

## 2023-09-06 LAB — GLUCOSE, CAPILLARY
Glucose-Capillary: 125 mg/dL — ABNORMAL HIGH (ref 70–99)
Glucose-Capillary: 119 mg/dL — ABNORMAL HIGH (ref 70–99)

## 2023-09-06 MED ORDER — SORBITOL 70 % SOLN
30.0000 mL | Freq: Every day | Status: DC | PRN
  Administered 2023-09-06: 30 mL via ORAL
  Filled 2023-09-06 (×2): qty 30

## 2023-09-06 MED ORDER — BISACODYL 5 MG PO TBEC
10.0000 mg | DELAYED_RELEASE_TABLET | Freq: Every day | ORAL | Status: DC | PRN
  Administered 2023-09-06: 10 mg via ORAL
  Filled 2023-09-06: qty 2

## 2023-09-06 MED ORDER — SENNA 8.6 MG PO TABS: 2.0000 | ORAL_TABLET | Freq: Every day | ORAL | Status: DC

## 2023-09-06 MED ORDER — SENNA 8.6 MG PO TABS: 2.0000 | ORAL_TABLET | Freq: Every day | ORAL | Status: AC

## 2023-09-06 NOTE — TOC Transition Note (Signed)
 Transition of Care [facility]) - Discharge Note   Patient Details  Name: Sean [name] MRN: [id number] Date of Birth: 1960/07/21  [facility] CM/SW Contact:  Amada Jupiter, LCSW Phone Number: 09/06/2023, 3:03 PM   Clinical Narrative:     Have received insurance authorization for pt to admit to [place] today.  Pt aware/ agreeable and MD has medically cleared.  PTAR called at 2:55pm.  RN to call report to [phone].  No further TOC needs.  Final next level of care: Skilled Nursing Facility Barriers to Discharge: Barriers Resolved   Patient Goals and CMS Choice Patient states their goals for this hospitalization and ongoing recovery are:: return to [facility]          Discharge Placement              Patient chooses bed at: [facility] Patient to be transferred to facility by: PTAR Name of family member notified: pt to notify    Discharge Plan and Services Additional resources added to the After Visit Summary for   In-house Referral: Clinical Social Work              DME Arranged: N/A DME Agency: NA                  Social Drivers of Health (SDOH) Interventions SDOH Screenings   Food Insecurity: No Food Insecurity (09/02/2023)  Housing: Low Risk  (09/02/2023)  Transportation Needs: No Transportation Needs (09/02/2023)  Utilities: Not At Risk (09/02/2023)  Social Connections: Socially Isolated (09/02/2023)  Tobacco Use: High Risk (09/01/2023)     Readmission Risk Interventions     No data to display

## 2023-09-06 NOTE — Plan of Care (Signed)
  Problem: Education: Goal: Knowledge of General Education information will improve Description: Including pain rating scale, medication(s)/side effects and non-pharmacologic comfort measures Outcome: Adequate for Discharge   Problem: Health Behavior/Discharge Planning: Goal: Ability to manage health-related needs will improve Outcome: Adequate for Discharge   Problem: Clinical Measurements: Goal: Ability to maintain clinical measurements within normal limits will improve Outcome: Adequate for Discharge Goal: Will remain free from infection Outcome: Adequate for Discharge Goal: Diagnostic test results will improve Outcome: Adequate for Discharge Goal: Respiratory complications will improve Outcome: Adequate for Discharge Goal: Cardiovascular complication will be avoided Outcome: Adequate for Discharge   Problem: Activity: Goal: Risk for activity intolerance will decrease Outcome: Adequate for Discharge   Problem: Nutrition: Goal: Adequate nutrition will be maintained Outcome: Adequate for Discharge   Problem: Coping: Goal: Level of anxiety will decrease Outcome: Adequate for Discharge   Problem: Elimination: Goal: Will not experience complications related to bowel motility Outcome: Adequate for Discharge Goal: Will not experience complications related to urinary retention Outcome: Adequate for Discharge   Problem: Pain Managment: Goal: General experience of comfort will improve and/or be controlled Outcome: Adequate for Discharge   Problem: Safety: Goal: Ability to remain free from injury will improve Outcome: Adequate for Discharge   Problem: Skin Integrity: Goal: Risk for impaired skin integrity will decrease Outcome: Adequate for Discharge   Problem: Education: Goal: Ability to describe self-care measures that may prevent or decrease complications (Diabetes Survival Skills Education) will improve Outcome: Adequate for Discharge Goal: Individualized Educational  Video(s) Outcome: Adequate for Discharge   Problem: Coping: Goal: Ability to adjust to condition or change in health will improve Outcome: Adequate for Discharge   Problem: Fluid Volume: Goal: Ability to maintain a balanced intake and output will improve Outcome: Adequate for Discharge   Problem: Health Behavior/Discharge Planning: Goal: Ability to identify and utilize available resources and services will improve Outcome: Adequate for Discharge Goal: Ability to manage health-related needs will improve Outcome: Adequate for Discharge   Problem: Metabolic: Goal: Ability to maintain appropriate glucose levels will improve Outcome: Adequate for Discharge   Problem: Nutritional: Goal: Maintenance of adequate nutrition will improve Outcome: Adequate for Discharge Goal: Progress toward achieving an optimal weight will improve Outcome: Adequate for Discharge   Problem: Skin Integrity: Goal: Risk for impaired skin integrity will decrease Outcome: Adequate for Discharge   Problem: Tissue Perfusion: Goal: Adequacy of tissue perfusion will improve Outcome: Adequate for Discharge   Problem: Education: Goal: Knowledge of the prescribed therapeutic regimen will improve Outcome: Adequate for Discharge Goal: Individualized Educational Video(s) Outcome: Adequate for Discharge   Problem: Activity: Goal: Ability to avoid complications of mobility impairment will improve Outcome: Adequate for Discharge Goal: Range of joint motion will improve Outcome: Adequate for Discharge   Problem: Clinical Measurements: Goal: Postoperative complications will be avoided or minimized Outcome: Adequate for Discharge   Problem: Pain Management: Goal: Pain level will decrease with appropriate interventions Outcome: Adequate for Discharge   Problem: Skin Integrity: Goal: Will show signs of wound healing Outcome: Adequate for Discharge

## 2023-09-06 NOTE — Progress Notes (Signed)
 Physical Therapy Treatment Patient Details Name: Sean [name] MRN: [id number] DOB: 1961/01/06 Today's Date: 09/06/2023   History of Present Illness Pt is a 63 year old male s/p L TKA on 09/01/23.  PMHx: R THA 11/28/2021, DM, dyspnea, HTN    PT Comments  Pt found in bed with pillow under knee in ~ 55 degrees knee flexion. Reviewed importance of terminal knee extension. Pt amb 80' with RW and CGA needing multiple rest breaks d/t pain and fatigue. Once pt returned to supine, placed in KI with ice and pillow under ankle as well as pillow to lateral thigh/hip to encourage knee extension.     If plan is discharge home, recommend the following: A little help with walking and/or transfers;A little help with bathing/dressing/bathroom;Assistance with cooking/housework;Assist for transportation;Help with stairs or ramp for entrance   Can travel by private vehicle     No  Equipment Recommendations  Rolling walker (2 wheels)    Recommendations for Other Services       Precautions / Restrictions Precautions Precautions: Fall;Knee Recall of Precautions/Restrictions: Intact Required Braces or Orthoses: Knee Immobilizer - Left Restrictions Weight Bearing Restrictions Per Provider Order: No LLE Weight Bearing Per Provider Order: Weight bearing as tolerated     Mobility  Bed Mobility Overal bed mobility: Needs Assistance Bed Mobility: Supine to Sit, Sit to Supine     Supine to sit: Supervision Sit to supine: Supervision   General bed mobility comments: increased time and effort    Transfers Overall transfer level: Needs assistance Equipment used: Rolling walker (2 wheels) Transfers: Sit to/from Stand Sit to Stand: Contact guard assist, Min assist           General transfer comment: verbal cues for safety,  UE and LE positioning for pain control; CGA to min assist to rise and control descent    Ambulation/Gait Ambulation/Gait assistance: Contact guard assist Gait Distance (Feet): 80  Feet Assistive device: Rolling walker (2 wheels) Gait Pattern/deviations: Step-to pattern, Decreased stance time - left, Antalgic, Knee flexed in stance - right, Knee flexed in stance - left Gait velocity: decr     General Gait Details: verbal cuesfor RW position, L knee extension and safety; CGA to steady. pt heaviliy reliant on UEs, unable to extend L knee, maintains knee in ~ 40 degrees flexion, R knee in ~20 degrees flexion; 4 standing rests needed to complete distance   Stairs             Wheelchair Mobility     Tilt Bed    Modified Rankin (Stroke Patients Only)       Balance                                            Communication Communication Communication: No apparent difficulties  Cognition Arousal: Alert Behavior During Therapy: WFL for tasks assessed/performed   PT - Cognitive impairments: No apparent impairments                         Following commands: Intact      Cueing Cueing Techniques: Verbal cues  Exercises Total Joint Exercises Ankle Circles/Pumps: AROM, Both, 5 reps Quad Sets: AROM, Left, 5 reps Heel Slides: AROM, Left, 5 reps Goniometric ROM: ~ 40 degrees to 90 degrees felxion    General Comments        Pertinent Vitals/Pain Pain  Assessment Pain Assessment: 0-10 Pain Score: 8  Pain Location: left knee and thigh Pain Descriptors / Indicators: Sore, Tightness, Guarding, Grimacing Pain Intervention(s): Limited activity within patient's tolerance, Monitored during session, Premedicated before session, Repositioned, Ice applied    Home Living                          Prior Function            PT Goals (current goals can now be found in the care plan section) Acute Rehab PT Goals PT Goal Formulation: With patient Time For Goal Achievement: 09/09/23 Potential to Achieve Goals: Good Progress towards PT goals: Progressing toward goals    Frequency    7X/week      PT Plan       Co-evaluation              AM-PAC PT "6 Clicks" Mobility   Outcome Measure  Help needed turning from your back to your side while in a flat bed without using bedrails?: A Little Help needed moving from lying on your back to sitting on the side of a flat bed without using bedrails?: A Little Help needed moving to and from a bed to a chair (including a wheelchair)?: A Little Help needed standing up from a chair using your arms (e.g., wheelchair or bedside chair)?: A Little Help needed to walk in hospital room?: A Little Help needed climbing 3-5 steps with a railing? : A Lot 6 Click Score: 17    End of Session Equipment Utilized During Treatment: Gait belt Activity Tolerance: Patient limited by pain;Patient limited by fatigue Patient left: in bed;with call bell/phone within reach;with bed alarm set Nurse Communication: Mobility status PT Visit Diagnosis: Difficulty in walking, not elsewhere classified (R26.2);Pain Pain - Right/Left: Left Pain - part of body: Knee     Time: 1103-1130 PT Time Calculation (min) (ACUTE ONLY): 27 min  Charges:    $Gait Training: 8-22 mins $Therapeutic Activity: 8-22 mins PT General Charges $$ ACUTE PT VISIT: 1 Visit                     Zanaya Baize, PT  Acute Rehab Dept [facility]) [phone]  09/06/2023    [facility] 09/06/2023, 11:42 AM

## 2023-09-27 IMAGING — CT CT HIP*R* W/O CM
2 of 4 series · 15 of 46 positions shown, 17 images · non-contrast
Comparison: Right hip x-rays from yesterday. MRI right hip dated

CLINICAL DATA: Severe right hip pain for the past 6 weeks.

EXAM:
CT OF THE RIGHT HIP WITHOUT CONTRAST
TECHNIQUE: Multidetector CT imaging of the right hip was performed according to
the standard protocol. Multiplanar CT image reconstructions were
also generated.
RADIATION DOSE REDUCTION: This exam was performed according to the
departmental dose-optimization program which includes automated
exposure control, adjustment of the mA and/or kV according to
patient size and/or use of iterative reconstruction technique.

[Series 9: coronal st · coronal · 0.38mm/px · 3 of 191 slices shown]
[im 64/191  soft-tissue]
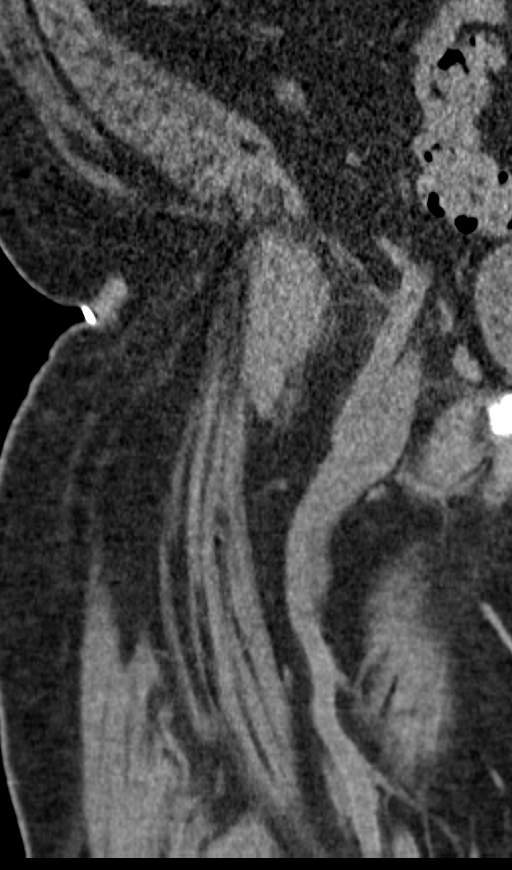
[im 85/191  soft-tissue]
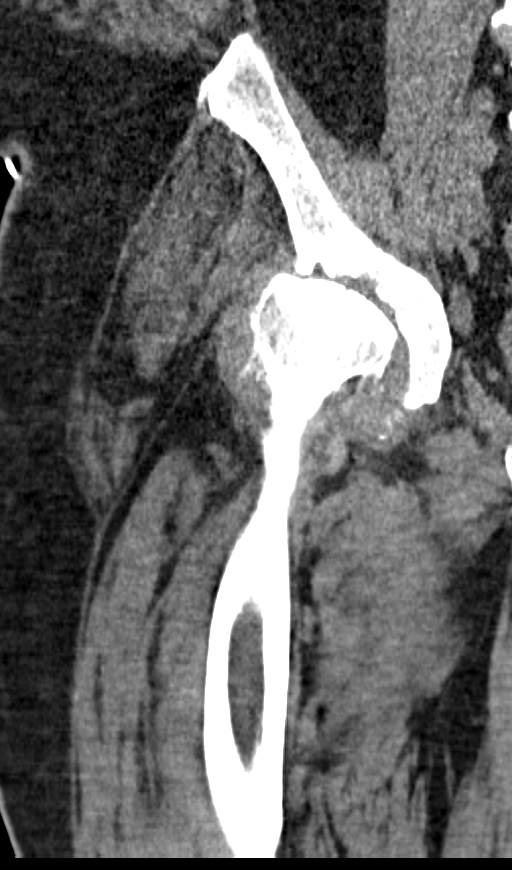
[im 106/191  soft-tissue]
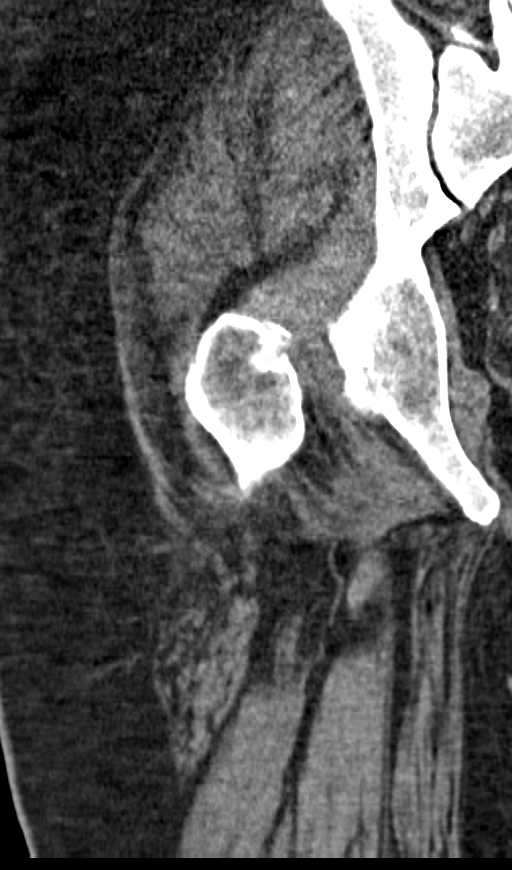

[Series 11: pelvis thin · axial · 0.51mm/px · z∈[+798,+1098]mm · 12 of 546 slices shown, 14 images]
[im 23/546  soft-tissue]
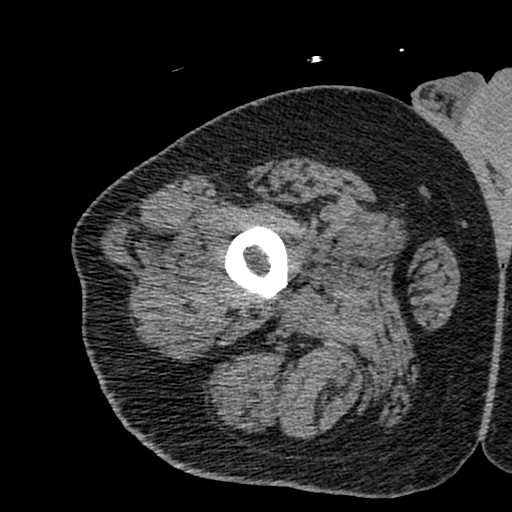
[im 23/546  bone]
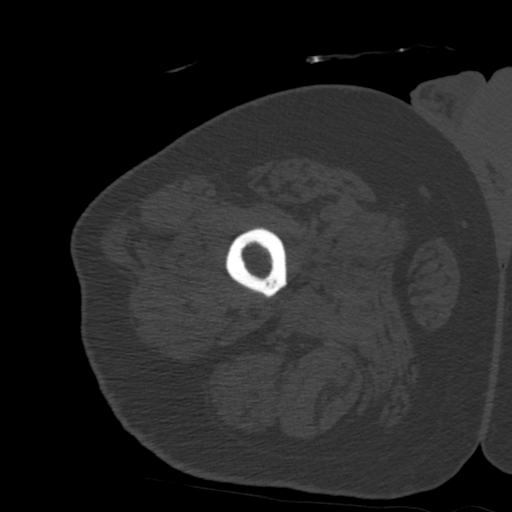
[im 69/546  soft-tissue]
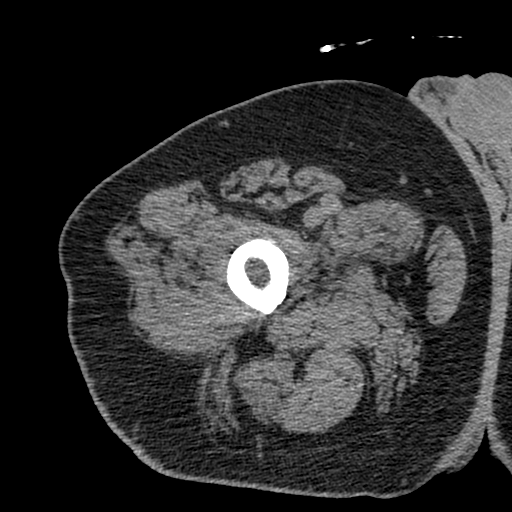
[im 114/546  soft-tissue]
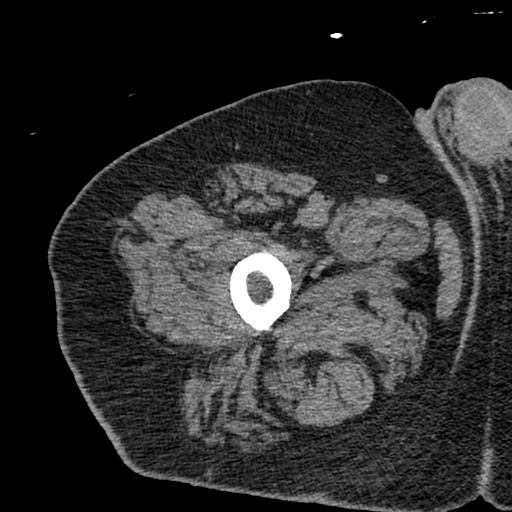
[im 159/546  soft-tissue]
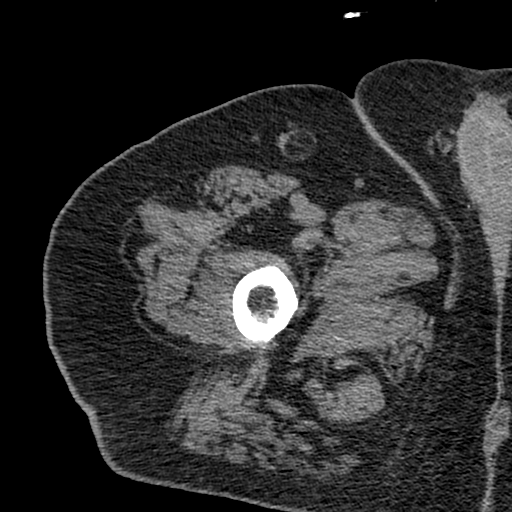
[im 205/546  soft-tissue]
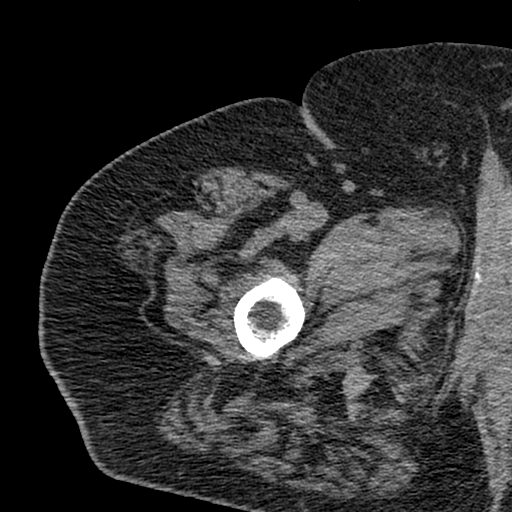
[im 250/546  soft-tissue]
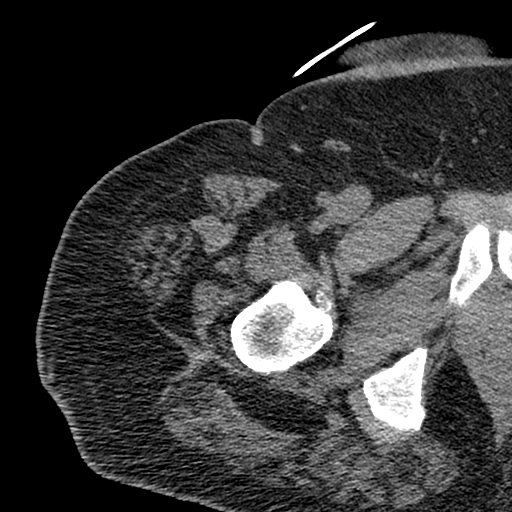
[im 296/546  soft-tissue]
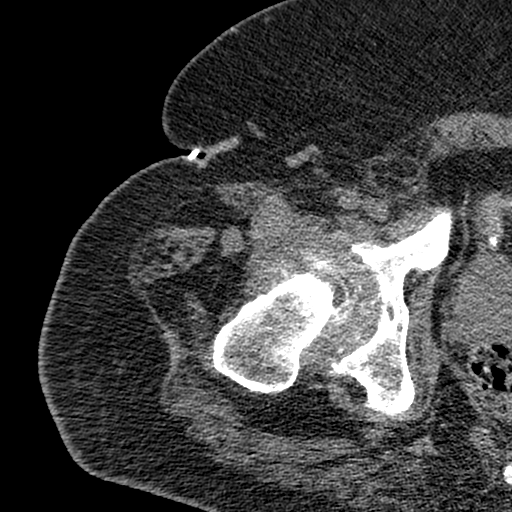
[im 341/546  soft-tissue]
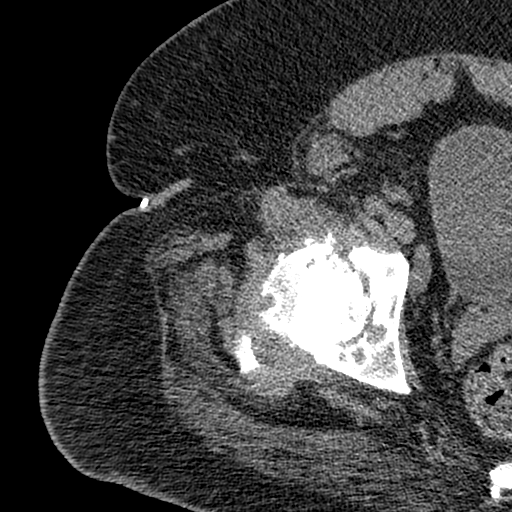
[im 387/546  soft-tissue]
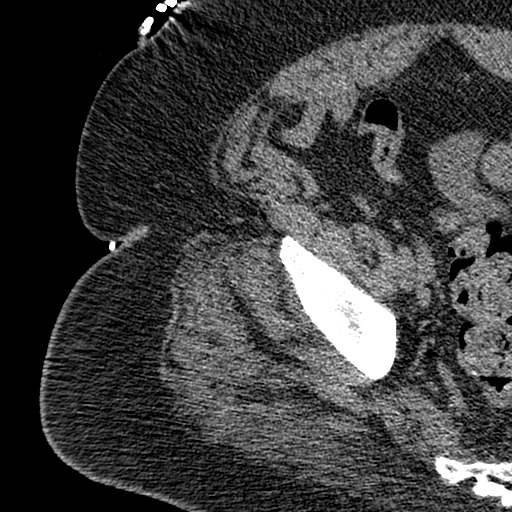
[im 387/546  bone]
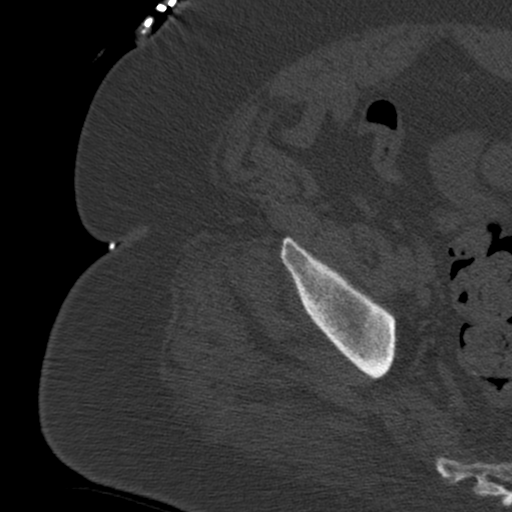
[im 432/546  soft-tissue]
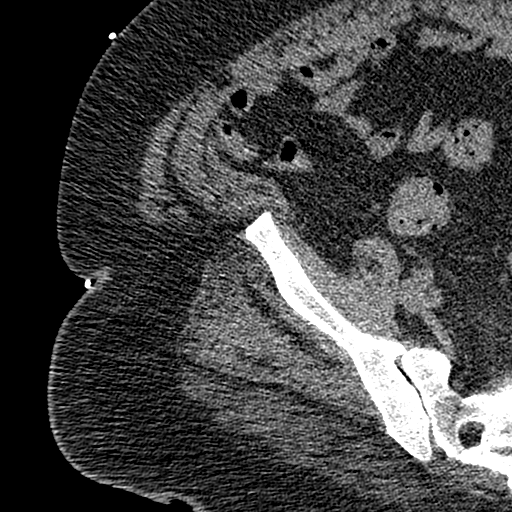
[im 477/546  soft-tissue]
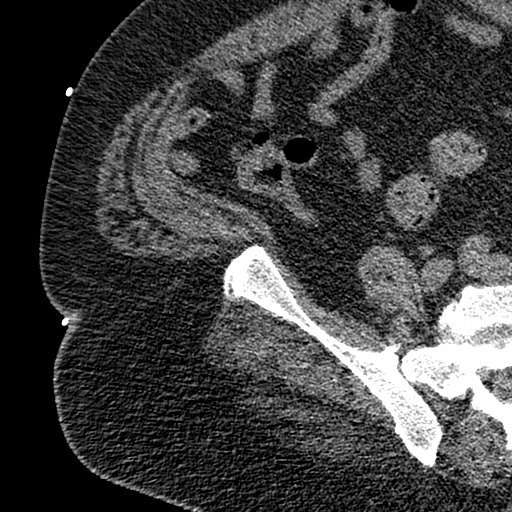
[im 523/546  soft-tissue]
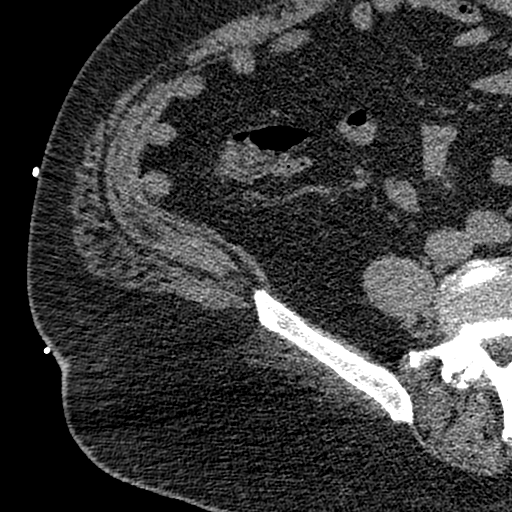

[15 of 46 positions shown; findings below may reference images not displayed]

FINDINGS: Bones/Joint/Cartilage

Right femoral head subchondral fracture and prominent flattening
again noted. Prominent subchondral sclerosis and cystic change in
the right femoral head and acetabulum with superior migration of the
right femur. Marginal osteophytes. No dislocation. Small complex
joint effusion.

Ligaments

Ligaments are suboptimally evaluated by CT.

Muscles and Tendons
Grossly intact.

Soft tissue
No fluid collection or hematoma. No soft tissue mass. Small fat
containing right inguinal hernia.
IMPRESSION: 1. Similar findings consistent with advanced right femoral head
avascular necrosis with secondary end-stage right hip
osteoarthritis.

## 2023-10-01 IMAGING — DX DG CHEST 1V PORT
1 series · 1 of 1 positions shown · non-contrast
Comparison: Radiograph December 01, 2021

CLINICAL DATA: Shortness of breath

EXAM:
PORTABLE CHEST 1 VIEW

[chest ap]
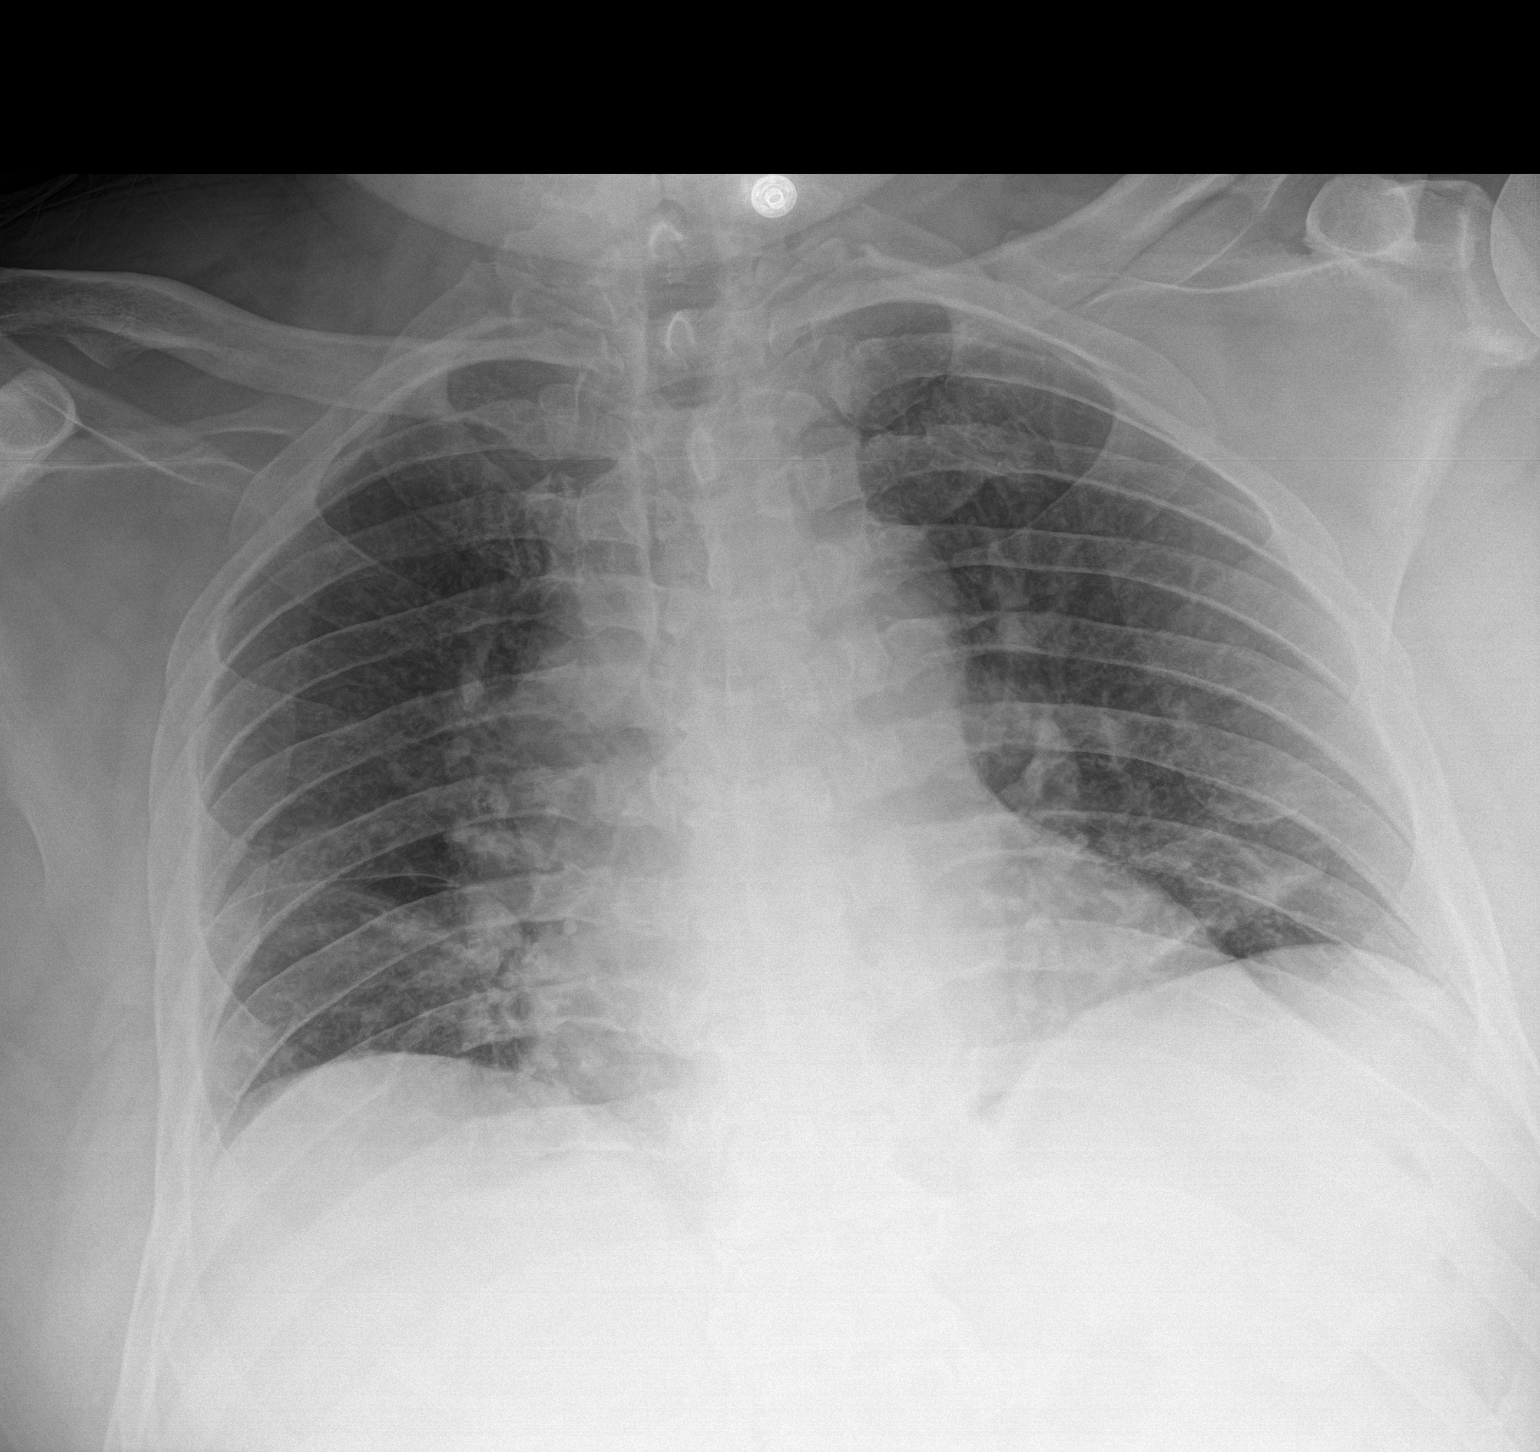

[1 of 1 positions shown; findings below may reference images not displayed]

FINDINGS: The heart size and mediastinal contours are within normal limits.
Low lung volumes with bibasilar atelectasis. No visible pleural
effusion or pneumothorax. The visualized skeletal structures are
unchanged.
IMPRESSION: Low lung volumes with bibasilar atelectasis.

## 2023-12-21 ENCOUNTER — Ambulatory Visit (HOSPITAL_COMMUNITY)
Admission: RE | Admit: 2023-12-21 | Discharge: 2023-12-21 | Disposition: A | Discharge status: Home / Self Care | Source: Ambulatory Visit | Attending: Surgery | Admitting: Surgery

## 2023-12-21 ENCOUNTER — Other Ambulatory Visit (HOSPITAL_COMMUNITY): Payer: Self-pay | Admitting: Medical

## 2023-12-21 DIAGNOSIS — M79605 Pain in left leg: Secondary | ICD-10-CM

## 2023-12-21 DIAGNOSIS — M7989 Other specified soft tissue disorders: Secondary | ICD-10-CM | POA: Diagnosis not present

## 2024-03-20 ENCOUNTER — Ambulatory Visit (INDEPENDENT_AMBULATORY_CARE_PROVIDER_SITE_OTHER): Admitting: Podiatry

## 2024-03-20 DIAGNOSIS — Z91199 Patient's noncompliance with other medical treatment and regimen due to unspecified reason: Secondary | ICD-10-CM

## 2024-03-20 NOTE — Progress Notes (Signed)
 No show

## 2024-03-26 ENCOUNTER — Encounter (HOSPITAL_COMMUNITY): Payer: Self-pay

## 2024-03-26 ENCOUNTER — Other Ambulatory Visit: Payer: Self-pay

## 2024-03-26 ENCOUNTER — Emergency Department (HOSPITAL_COMMUNITY)
Admission: EM | Admit: 2024-03-26 | Discharge: 2024-03-26 | Disposition: A | Discharge status: Home / Self Care | Attending: Emergency Medicine | Admitting: Emergency Medicine

## 2024-03-26 DIAGNOSIS — Z79899 Other long term (current) drug therapy: Secondary | ICD-10-CM | POA: Insufficient documentation

## 2024-03-26 DIAGNOSIS — Z5900 Homelessness unspecified: Secondary | ICD-10-CM | POA: Insufficient documentation

## 2024-03-26 DIAGNOSIS — I10 Essential (primary) hypertension: Secondary | ICD-10-CM | POA: Insufficient documentation

## 2024-03-26 DIAGNOSIS — M7989 Other specified soft tissue disorders: Secondary | ICD-10-CM | POA: Diagnosis present

## 2024-03-26 DIAGNOSIS — E875 Hyperkalemia: Secondary | ICD-10-CM | POA: Insufficient documentation

## 2024-03-26 DIAGNOSIS — R600 Localized edema: Secondary | ICD-10-CM | POA: Diagnosis not present

## 2024-03-26 DIAGNOSIS — E119 Type 2 diabetes mellitus without complications: Secondary | ICD-10-CM | POA: Diagnosis not present

## 2024-03-26 DIAGNOSIS — N179 Acute kidney failure, unspecified: Secondary | ICD-10-CM | POA: Diagnosis not present

## 2024-03-26 DIAGNOSIS — D72829 Elevated white blood cell count, unspecified: Secondary | ICD-10-CM | POA: Diagnosis not present

## 2024-03-26 DIAGNOSIS — R609 Edema, unspecified: Secondary | ICD-10-CM

## 2024-03-26 DIAGNOSIS — J45909 Unspecified asthma, uncomplicated: Secondary | ICD-10-CM | POA: Diagnosis not present

## 2024-03-26 DIAGNOSIS — Z7984 Long term (current) use of oral hypoglycemic drugs: Secondary | ICD-10-CM | POA: Insufficient documentation

## 2024-03-26 DIAGNOSIS — D649 Anemia, unspecified: Secondary | ICD-10-CM | POA: Insufficient documentation

## 2024-03-26 DIAGNOSIS — E871 Hypo-osmolality and hyponatremia: Secondary | ICD-10-CM | POA: Diagnosis not present

## 2024-03-26 LAB — CBC
HCT: 39.6 % (ref 39.0–52.0)
Hemoglobin: 12.8 g/dL — ABNORMAL LOW (ref 13.0–17.0)
MCH: 31.1 pg (ref 26.0–34.0)
MCHC: 32.3 g/dL (ref 30.0–36.0)
MCV: 96.4 fL (ref 80.0–100.0)
Platelets: 254 K/uL (ref 150–400)
RBC: 4.11 MIL/uL — ABNORMAL LOW (ref 4.22–5.81)
RDW: 13.3 % (ref 11.5–15.5)
WBC: 10.7 K/uL — ABNORMAL HIGH (ref 4.0–10.5)
nRBC: 0 % (ref 0.0–0.2)

## 2024-03-26 LAB — BASIC METABOLIC PANEL WITH GFR
Anion gap: 11 (ref 5–15)
BUN: 24 mg/dL — ABNORMAL HIGH (ref 8–23)
CO2: 21 mmol/L — ABNORMAL LOW (ref 22–32)
Calcium: 10.3 mg/dL (ref 8.9–10.3)
Chloride: 102 mmol/L (ref 98–111)
Creatinine, Ser: 1.57 mg/dL — ABNORMAL HIGH (ref 0.61–1.24)
GFR, Estimated: 50 mL/min — ABNORMAL LOW (ref >60)
Glucose, Bld: 108 mg/dL — ABNORMAL HIGH (ref 70–99)
Potassium: 5.4 mmol/L — ABNORMAL HIGH (ref 3.5–5.1)
Sodium: 134 mmol/L — ABNORMAL LOW (ref 135–145)

## 2024-03-26 LAB — PRO BRAIN NATRIURETIC PEPTIDE: Pro Brain Natriuretic Peptide: <50 pg/mL (ref <300.0)

## 2024-03-26 MED ORDER — FUROSEMIDE 40 MG PO TABS: 40.0000 mg | ORAL_TABLET | Freq: Every day | ORAL | 0 refills | Status: DC

## 2024-03-26 MED ORDER — METHOCARBAMOL 500 MG PO TABS: 500.0000 mg | ORAL_TABLET | Freq: Two times a day (BID) | ORAL | 0 refills | Status: AC

## 2024-03-26 MED ORDER — FUROSEMIDE 40 MG PO TABS
40.0000 mg | ORAL_TABLET | Freq: Once | ORAL | Status: AC
  Administered 2024-03-26: 40 mg via ORAL
  Filled 2024-03-26: qty 1

## 2024-03-26 MED ORDER — SODIUM CHLORIDE 0.9 % IV BOLUS
500.0000 mL | Freq: Once | INTRAVENOUS | Status: AC
  Administered 2024-03-26: 500 mL via INTRAVENOUS

## 2024-03-26 MED ORDER — SODIUM CHLORIDE 0.9 % IV BOLUS: 1000.0000 mL | Freq: Once | INTRAVENOUS | Status: DC

## 2024-03-26 MED ORDER — FUROSEMIDE 40 MG PO TABS: 40.0000 mg | ORAL_TABLET | Freq: Every day | ORAL | 0 refills | Status: AC

## 2024-03-26 MED ORDER — ACETAMINOPHEN 325 MG PO TABS
650.0000 mg | ORAL_TABLET | Freq: Once | ORAL | Status: AC
  Administered 2024-03-26: 650 mg via ORAL
  Filled 2024-03-26: qty 2

## 2024-03-26 MED ORDER — SODIUM ZIRCONIUM CYCLOSILICATE 10 G PO PACK
10.0000 g | PACK | Freq: Once | ORAL | Status: AC
  Administered 2024-03-26: 10 g via ORAL
  Filled 2024-03-26: qty 1

## 2024-03-26 MED ORDER — HYDRALAZINE HCL 25 MG PO TABS
25.0000 mg | ORAL_TABLET | Freq: Once | ORAL | Status: AC
  Administered 2024-03-26: 25 mg via ORAL
  Filled 2024-03-26: qty 1

## 2024-03-26 MED ORDER — FENTANYL CITRATE PF 50 MCG/ML IJ SOSY
50.0000 ug | PREFILLED_SYRINGE | Freq: Once | INTRAMUSCULAR | Status: AC
  Administered 2024-03-26: 50 ug via INTRAVENOUS
  Filled 2024-03-26: qty 1

## 2024-03-26 MED ORDER — LISINOPRIL 10 MG PO TABS
20.0000 mg | ORAL_TABLET | Freq: Once | ORAL | Status: AC
  Administered 2024-03-26: 20 mg via ORAL
  Filled 2024-03-26: qty 2

## 2024-03-26 MED ORDER — METHOCARBAMOL 500 MG PO TABS: 500.0000 mg | ORAL_TABLET | Freq: Two times a day (BID) | ORAL | 0 refills | Status: DC

## 2024-03-26 NOTE — ED Provider Notes (Signed)
 [place] EMERGENCY DEPARTMENT AT [facility] Provider Note   CSN: [id number] Arrival date & time: 03/26/24  9662     Patient presents with: Leg Swelling   Sean [name] is a 63 y.o. male with past medical history seen for hypertension, diabetes, arthritis, asthma, homelessness who presents with concern for bilateral leg swelling, pain for 2 weeks.  He reports that he feels like he is having some muscle spasms.  He was requesting talking to social work.  He reports that the pain is 10/10.  He reports that he is spending a lot of time on his feet.   HPI     Prior to Admission medications   Medication Sig Start Date End Date Taking? Authorizing Provider  methocarbamol  (ROBAXIN ) 500 MG tablet Take 1 tablet (500 mg total) by mouth 2 (two) times daily. 03/26/24  Yes Patric Vanpelt H, PA-C  chlorhexidine  (HIBICLENS ) 4 % external liquid Apply 15 mLs (1 Application total) topically as directed for 30 doses. Use as directed daily for 5 days every other week for 6 weeks. 09/01/23   Swinteck, Redell, MD  cyclobenzaprine (FLEXERIL) 10 MG tablet Take 10 mg by mouth at bedtime. 07/13/23   [provider]  DULoxetine  (CYMBALTA ) 30 MG capsule Take 30 mg by mouth every morning. 07/13/23   [provider]  dutasteride  (AVODART ) 0.5 MG capsule Take 0.5 mg by mouth in the morning.    [provider]  FLUoxetine  (PROZAC ) 40 MG capsule Take 40 mg by mouth every morning. 10/01/21   [provider]  furosemide  (LASIX ) 40 MG tablet Take 1 tablet (40 mg total) by mouth daily. 03/26/24   Jonell Krontz H, PA-C  gabapentin  (NEURONTIN ) 400 MG capsule Take 400 mg by mouth in the morning. 10/24/21   [provider]  lisinopril -hydrochlorothiazide  (ZESTORETIC ) 20-25 MG tablet Take 1 tablet by mouth every morning. 07/13/23   [provider]  meloxicam  (MOBIC ) 15 MG tablet Take 15 mg by mouth in the morning.    [provider]  Menthol , Topical  Analgesic, (ICY HOT EX) Apply 1 application. topically 2 (two) times daily as needed (pain).    [provider]  metFORMIN  (GLUCOPHAGE ) 500 MG tablet Take 500 mg by mouth in the morning and at bedtime. 08/11/21   [provider]  omeprazole (PRILOSEC) 20 MG capsule Take 20 mg by mouth daily as needed (indigestion/heartburn.). 09/12/21   [provider]  ondansetron  (ZOFRAN ) 4 MG tablet Take 1 tablet (4 mg total) by mouth every 8 (eight) hours as needed for nausea or vomiting. 09/03/23   Swinteck, Redell, MD  polyethylene glycol (MIRALAX ) 17 g packet Take 17 g by mouth daily as needed for moderate constipation or severe constipation. 09/03/23   Swinteck, Redell, MD  rosuvastatin  (CRESTOR ) 20 MG tablet Take 20 mg by mouth in the morning. 07/13/23   [provider]  tamsulosin  (FLOMAX ) 0.4 MG CAPS capsule Take 1 capsule (0.4 mg total) by mouth daily. Patient taking differently: Take 0.4 mg by mouth every evening. 12/04/21   Singh, Prashant K, MD    Allergies: Patient has no known allergies.    Review of Systems  All other systems reviewed and are negative.   Updated Vital Signs BP (!) 135/98 (BP Location: Left Arm)   Pulse 86   Temp 98.3 F (36.8 C) (Oral)   Resp 20   Ht 5' 6 (1.676 m)   Wt 104.3 kg   SpO2 99%   BMI 37.12 kg/m  Physical Exam Vitals and nursing note reviewed.  Constitutional:      General: He is not in acute distress.    Appearance: Normal appearance.  HENT:     Head: Normocephalic and atraumatic.  Eyes:     General:        Right eye: No discharge.        Left eye: No discharge.  Cardiovascular:     Rate and Rhythm: Normal rate and regular rhythm.     Pulses: Normal pulses.     Heart sounds: No murmur heard.    No friction rub. No gallop.  Pulmonary:     Effort: Pulmonary effort is normal.     Breath sounds: Normal breath sounds.  Abdominal:     General: Bowel sounds are normal.     Palpations: Abdomen is soft.   Musculoskeletal:     Comments: Bilateral lower extremity edema, nonpitting, 2+  Skin:    General: Skin is warm and dry.     Capillary Refill: Capillary refill takes less than 2 seconds.  Neurological:     Mental Status: He is alert and oriented to person, place, and time.  Psychiatric:        Mood and Affect: Mood normal.        Behavior: Behavior normal.     (all labs ordered are listed, but only abnormal results are displayed) Labs Reviewed  CBC - Abnormal; Notable for the following components:      Result Value   WBC 10.7 (*)    RBC 4.11 (*)    Hemoglobin 12.8 (*)    All other components within normal limits  BASIC METABOLIC PANEL WITH GFR - Abnormal; Notable for the following components:   Sodium 134 (*)    Potassium 5.4 (*)    CO2 21 (*)    Glucose, Bld 108 (*)    BUN 24 (*)    Creatinine, Ser 1.57 (*)    GFR, Estimated 50 (*)    All other components within normal limits  PRO BRAIN NATRIURETIC PEPTIDE    EKG: None  Radiology: No results found.   Procedures   Medications Ordered in the ED  furosemide  (LASIX ) tablet 40 mg (40 mg Oral Given 03/26/24 0739)  acetaminophen  (TYLENOL ) tablet 650 mg (650 mg Oral Given 03/26/24 0739)  hydrALAZINE (APRESOLINE) tablet 25 mg (25 mg Oral Given 03/26/24 0849)  lisinopril  (ZESTRIL ) tablet 20 mg (20 mg Oral Given 03/26/24 0848)  sodium zirconium cyclosilicate  (LOKELMA ) packet 10 g (10 g Oral Given 03/26/24 0956)  sodium chloride  0.9 % bolus 500 mL (500 mLs Intravenous New Bag/Given 03/26/24 0906)  fentaNYL  (SUBLIMAZE ) injection 50 mcg (50 mcg Intravenous Given 03/26/24 0956)                                    Medical Decision Making Amount and/or Complexity of Data Reviewed Labs: ordered.  Risk OTC drugs. Prescription drug management.   This patient is a 63 y.o. male  who presents to the ED for concern of leg swelling, pain.   Differential diagnoses prior to evaluation: The emergent differential diagnosis includes,  but is not limited to, cellulitis, heart failure, CKD,. This is not an exhaustive differential.   Past Medical History / Co-morbidities / Social History: Hypertension, diabetes, asthma, arthritis, depression  Additional history: Chart reviewed. Pertinent results include: reviewed labwork, imaging from previous ED visits  Physical Exam: Physical exam performed.  The pertinent findings include: 2+ nonpitting edema bilateral lower extremities  Lab Tests/Imaging studies: I personally interpreted labs/imaging and the pertinent results include: BC with mild leukocytosis, white blood cells 10.7, mild anemia, hemoglobin 12.8, his BNP is unremarkable, leg swelling likely not secondary to heart failure.  His BNP is notable for mild hyponatremia, sodium 134, he does have some hyperkalemia, Tessman 5.4, BUN, creatinine are elevated compared to baseline, 24, 1.57, normal creatinine around 0.9.    Medications: I ordered medication including hydralazine, lisinopril  for blood pressure, Tylenol  for pain, Lasix  due to suspected dependent edema, given his small AKI I feel that he is intravascularly depleted despite his retain fluid, will administer small fluid bolus..  I have reviewed the patients home medicines and have made adjustments as needed.   Disposition: After consideration of the diagnostic results and the patients response to treatment, I feel that patient with dependent edema, AKI, otherwise well-appearing, encouraged compression stockings, will discharge with short course of Lasix , encourage close PCP follow-up.   emergency department workup does not suggest an emergent condition requiring admission or immediate intervention beyond what has been performed at this time. The plan is: as above. The patient is safe for discharge and has been instructed to return immediately for worsening symptoms, change in symptoms or any other concerns.   Final diagnoses:  Dependent edema  AKI (acute kidney injury)   Hyperkalemia    ED Discharge Orders          Ordered    furosemide  (LASIX ) 40 MG tablet  Daily,   Status:  Discontinued        03/26/24 1010    furosemide  (LASIX ) 40 MG tablet  Daily        03/26/24 1031    methocarbamol  (ROBAXIN ) 500 MG tablet  2 times daily        03/26/24 1031               Terren Jandreau, [place], PA-C 03/26/24 1032    Geraldene Hamilton, MD 03/28/24 2214

## 2024-03-26 NOTE — ED Triage Notes (Signed)
 Pt arrived from home via POV c/o bilateral leg swelling and pain x 2 weeks.Pt states that he is having muscle spasms. Pt states that he also needs to talk to social work.

## 2024-03-26 NOTE — Discharge Instructions (Addendum)
 Your workup today did not show any evidence of heart failure.  I suspect that your swelling is from time spent on your feet. I recommend getting some compression stockings. You can take the medication I have prescribed to help with swelling.

## 2024-03-27 ENCOUNTER — Emergency Department (HOSPITAL_COMMUNITY)

## 2024-03-27 ENCOUNTER — Emergency Department (EMERGENCY_DEPARTMENT_HOSPITAL)

## 2024-03-27 ENCOUNTER — Emergency Department (HOSPITAL_COMMUNITY)
Admission: EM | Admit: 2024-03-27 | Discharge: 2024-03-27 | Disposition: A | Discharge status: Home / Self Care | Attending: Emergency Medicine | Admitting: Emergency Medicine

## 2024-03-27 ENCOUNTER — Encounter (HOSPITAL_COMMUNITY): Payer: Self-pay | Admitting: Radiology

## 2024-03-27 DIAGNOSIS — E119 Type 2 diabetes mellitus without complications: Secondary | ICD-10-CM | POA: Insufficient documentation

## 2024-03-27 DIAGNOSIS — R1032 Left lower quadrant pain: Secondary | ICD-10-CM | POA: Diagnosis present

## 2024-03-27 DIAGNOSIS — Z7984 Long term (current) use of oral hypoglycemic drugs: Secondary | ICD-10-CM | POA: Diagnosis not present

## 2024-03-27 DIAGNOSIS — R609 Edema, unspecified: Secondary | ICD-10-CM | POA: Diagnosis not present

## 2024-03-27 DIAGNOSIS — Z79899 Other long term (current) drug therapy: Secondary | ICD-10-CM | POA: Insufficient documentation

## 2024-03-27 DIAGNOSIS — I10 Essential (primary) hypertension: Secondary | ICD-10-CM | POA: Diagnosis not present

## 2024-03-27 LAB — COMPREHENSIVE METABOLIC PANEL WITH GFR
ALT: 13 U/L (ref 0–44)
AST: 20 U/L (ref 15–41)
Albumin: 4.7 g/dL (ref 3.5–5.0)
Alkaline Phosphatase: 71 U/L (ref 38–126)
Anion gap: 15 (ref 5–15)
BUN: 20 mg/dL (ref 8–23)
CO2: 20 mmol/L — ABNORMAL LOW (ref 22–32)
Calcium: 10.4 mg/dL — ABNORMAL HIGH (ref 8.9–10.3)
Chloride: 95 mmol/L — ABNORMAL LOW (ref 98–111)
Creatinine, Ser: 1.46 mg/dL — ABNORMAL HIGH (ref 0.61–1.24)
GFR, Estimated: 54 mL/min — ABNORMAL LOW (ref >60)
Glucose, Bld: 125 mg/dL — ABNORMAL HIGH (ref 70–99)
Potassium: 4.5 mmol/L (ref 3.5–5.1)
Sodium: 130 mmol/L — ABNORMAL LOW (ref 135–145)
Total Bilirubin: 0.5 mg/dL (ref 0.0–1.2)
Total Protein: 8.6 g/dL — ABNORMAL HIGH (ref 6.5–8.1)

## 2024-03-27 LAB — URINALYSIS, ROUTINE W REFLEX MICROSCOPIC
Bacteria, UA: NONE SEEN
Bilirubin Urine: NEGATIVE
Glucose, UA: NEGATIVE mg/dL
Ketones, ur: NEGATIVE mg/dL
Leukocytes,Ua: NEGATIVE
Nitrite: NEGATIVE
Protein, ur: NEGATIVE mg/dL
Specific Gravity, Urine: 1.005 (ref 1.005–1.030)
pH: 5 (ref 5.0–8.0)

## 2024-03-27 LAB — CBC WITH DIFFERENTIAL/PLATELET
Abs Immature Granulocytes: 0.02 K/uL (ref 0.00–0.07)
Basophils Absolute: 0 K/uL (ref 0.0–0.1)
Basophils Relative: 0 %
Eosinophils Absolute: 0.1 K/uL (ref 0.0–0.5)
Eosinophils Relative: 1 %
HCT: 36.3 % — ABNORMAL LOW (ref 39.0–52.0)
Hemoglobin: 12.2 g/dL — ABNORMAL LOW (ref 13.0–17.0)
Immature Granulocytes: 0 %
Lymphocytes Relative: 19 %
Lymphs Abs: 1.6 K/uL (ref 0.7–4.0)
MCH: 31.5 pg (ref 26.0–34.0)
MCHC: 33.6 g/dL (ref 30.0–36.0)
MCV: 93.8 fL (ref 80.0–100.0)
Monocytes Absolute: 0.8 K/uL (ref 0.1–1.0)
Monocytes Relative: 10 %
Neutro Abs: 5.6 K/uL (ref 1.7–7.7)
Neutrophils Relative %: 70 %
Platelets: 269 K/uL (ref 150–400)
RBC: 3.87 MIL/uL — ABNORMAL LOW (ref 4.22–5.81)
RDW: 13.2 % (ref 11.5–15.5)
WBC: 8 K/uL (ref 4.0–10.5)
nRBC: 0 % (ref 0.0–0.2)

## 2024-03-27 LAB — CK: Total CK: 197 U/L (ref 49–397)

## 2024-03-27 LAB — D-DIMER, QUANTITATIVE: D-Dimer, Quant: 1.34 ug{FEU}/mL — ABNORMAL HIGH (ref 0.00–0.50)

## 2024-03-27 MED ORDER — OXYCODONE HCL 5 MG PO TABS
5.0000 mg | ORAL_TABLET | Freq: Once | ORAL | Status: AC
  Administered 2024-03-27: 5 mg via ORAL
  Filled 2024-03-27: qty 1

## 2024-03-27 MED ORDER — TIZANIDINE HCL 4 MG PO TABS: 4.0000 mg | ORAL_TABLET | Freq: Four times a day (QID) | ORAL | 0 refills | Status: AC | PRN

## 2024-03-27 MED ORDER — FENTANYL CITRATE PF 50 MCG/ML IJ SOSY
50.0000 ug | PREFILLED_SYRINGE | Freq: Once | INTRAMUSCULAR | Status: AC
  Administered 2024-03-27: 50 ug via INTRAVENOUS
  Filled 2024-03-27: qty 1

## 2024-03-27 NOTE — ED Notes (Signed)
 Pt has been discharged and shelters/food discussed. Pt sitting on the side of the bed d/t feeling unsteady on his feet at times. WC at bedside.

## 2024-03-27 NOTE — Progress Notes (Signed)
 CSW spoke with pt who expressed needing support with finding housing. Pt reports he was staying at [facility] a month ago but left because they were coughing and fighting and all that stuff. CSW advised pt to follow back up with housing team at [employer] [facility] [employer]. CSW also informed pt to outreach to DSS to see if there is any income based housing resources available. Pt noted he will be 64 in the upcoming year and states he should be able to receive support. Pt expressed gratitude for resources.

## 2024-03-27 NOTE — ED Provider Notes (Signed)
 [place] EMERGENCY DEPARTMENT AT [facility] Provider Note   CSN: [id number] Arrival date & time: 03/27/24  9582     Patient presents with: Flank Pain   Sean [name] is a 63 y.o. male.   The history is provided by the patient and medical records.  Flank Pain  Sean [name] is a 63 y.o. male who presents to the Emergency Department complaining of abdominal pain. He presents the emergency department for evaluation of left lower quadrant/flank pain that started yesterday. Pain is constant but worse with movement, particularly exam. He also has pain on urination. No fever. He has nausea but no vomiting. No reported injuries. Symptoms started just after being seen in the emergency department yesterday for 1 to 2 weeks of bilateral lower extremity edema. He states he can walk but does walk with a limp. No midline back pain. No numbness or weakness. He does have history of hypertension and diabetes.     Prior to Admission medications   Medication Sig Start Date End Date Taking? Authorizing Provider  chlorhexidine  (HIBICLENS ) 4 % external liquid Apply 15 mLs (1 Application total) topically as directed for 30 doses. Use as directed daily for 5 days every other week for 6 weeks. 09/01/23   Swinteck, Redell, MD  cyclobenzaprine (FLEXERIL) 10 MG tablet Take 10 mg by mouth at bedtime. 07/13/23   [provider]  DULoxetine  (CYMBALTA ) 30 MG capsule Take 30 mg by mouth every morning. 07/13/23   [provider]  dutasteride  (AVODART ) 0.5 MG capsule Take 0.5 mg by mouth in the morning.    [provider]  FLUoxetine  (PROZAC ) 40 MG capsule Take 40 mg by mouth every morning. 10/01/21   [provider]  furosemide  (LASIX ) 40 MG tablet Take 1 tablet (40 mg total) by mouth daily. 03/26/24   Prosperi, Christian H, PA-C  gabapentin  (NEURONTIN ) 400 MG capsule Take 400 mg by mouth in the morning. 10/24/21   [provider]  lisinopril -hydrochlorothiazide   (ZESTORETIC ) 20-25 MG tablet Take 1 tablet by mouth every morning. 07/13/23   [provider]  meloxicam  (MOBIC ) 15 MG tablet Take 15 mg by mouth in the morning.    [provider]  Menthol , Topical Analgesic, (ICY HOT EX) Apply 1 application. topically 2 (two) times daily as needed (pain).    [provider]  metFORMIN  (GLUCOPHAGE ) 500 MG tablet Take 500 mg by mouth in the morning and at bedtime. 08/11/21   [provider]  methocarbamol  (ROBAXIN ) 500 MG tablet Take 1 tablet (500 mg total) by mouth 2 (two) times daily. 03/26/24   Prosperi, Christian H, PA-C  omeprazole (PRILOSEC) 20 MG capsule Take 20 mg by mouth daily as needed (indigestion/heartburn.). 09/12/21   [provider]  ondansetron  (ZOFRAN ) 4 MG tablet Take 1 tablet (4 mg total) by mouth every 8 (eight) hours as needed for nausea or vomiting. 09/03/23   Swinteck, Redell, MD  polyethylene glycol (MIRALAX ) 17 g packet Take 17 g by mouth daily as needed for moderate constipation or severe constipation. 09/03/23   Swinteck, Redell, MD  rosuvastatin  (CRESTOR ) 20 MG tablet Take 20 mg by mouth in the morning. 07/13/23   [provider]  tamsulosin  (FLOMAX ) 0.4 MG CAPS capsule Take 1 capsule (0.4 mg total) by mouth daily. Patient taking differently: Take 0.4 mg by mouth every evening. 12/04/21   Singh, Prashant K, MD    Allergies: Patient has no known allergies.    Review of Systems  Genitourinary:  Positive for flank pain.  All other systems reviewed and are negative.   Updated Vital Signs BP (!) 175/88 (BP Location: Left Arm)   Pulse 93   Temp 98 F (36.7 C) (Oral)   Resp 20   SpO2 98%   Physical Exam Vitals and nursing note reviewed.  Constitutional:      Appearance: He is well-developed.  HENT:     Head: Normocephalic and atraumatic.  Cardiovascular:     Rate and Rhythm: Normal rate and regular rhythm.  Pulmonary:     Effort: Pulmonary effort is normal. No respiratory distress.   Abdominal:     Palpations: Abdomen is soft.     Tenderness: There is no abdominal tenderness. There is no guarding or rebound.     Comments: Moderate left lower quadrant tenderness. No overlying rash.  Musculoskeletal:     Comments: No tenderness to palpation over the hip, midline back. 3+ pitting edema to the left lower extremity, 2+ pitting edema to the right lower extremity. 2+ DP pulses bilaterally  Skin:    General: Skin is warm and dry.  Neurological:     Mental Status: He is alert and oriented to person, place, and time.  Psychiatric:        Behavior: Behavior normal.     (all labs ordered are listed, but only abnormal results are displayed) Labs Reviewed  URINALYSIS, ROUTINE W REFLEX MICROSCOPIC - Abnormal; Notable for the following components:      Result Value   Color, Urine STRAW (*)    Hgb urine dipstick SMALL (*)    All other components within normal limits  CBC WITH DIFFERENTIAL/PLATELET - Abnormal; Notable for the following components:   RBC 3.87 (*)    Hemoglobin 12.2 (*)    HCT 36.3 (*)    All other components within normal limits  D-DIMER, QUANTITATIVE - Abnormal; Notable for the following components:   D-Dimer, Quant 1.34 (*)    All other components within normal limits  CK  COMPREHENSIVE METABOLIC PANEL WITH GFR    EKG: None  Radiology: No results found.   Procedures   Medications Ordered in the ED  oxyCODONE  (Oxy IR/ROXICODONE ) immediate release tablet 5 mg (5 mg Oral Given 03/27/24 0535)  fentaNYL  (SUBLIMAZE ) injection 50 mcg (50 mcg Intravenous Given 03/27/24 0636)                                    Medical Decision Making Amount and/or Complexity of Data Reviewed Labs: ordered. Radiology: ordered.  Risk Prescription drug management.   Patient with history of hypertension and diabetes here for evaluation of left lower quadrant pain that radiates to his back. He does have tenderness on examination. He also has a positional component to  his pain and is more uncomfortable if he is lying supine. He also has soft tissue swelling to his legs, left greater than right. Plan to obtain a CT study to rule out stone. Patient care transferred pending renal function, vascular ultrasound to rule out DVT. Patient also requests social work assistance due to housing instability.     Final diagnoses:  None    ED Discharge Orders     None          Griselda Norris, MD 03/27/24 [phone]

## 2024-03-27 NOTE — Progress Notes (Signed)
 BLE venous duplex has been completed.  Preliminary results given to Dr. Laurice.   Results can be found under chart review under CV PROC. 03/27/2024 10:01 AM Tamlyn Sides RVT, RDMS

## 2024-03-27 NOTE — ED Provider Notes (Signed)
 Patient seen after prior ED provider.  Patient is comfortable.  He is agreeable with plan for discharge.  He understands need for close outpatient follow-up.  Results of workup discussed extensively with the patient.  All questions answered.  Importance close follow-up stressed.  Strict return precautions given and understood.   Laurice Maude BROCKS, MD 03/27/24 [phone]

## 2024-03-27 NOTE — ED Triage Notes (Signed)
 Patient arrived with complaints of left flank pain, reports some dark urine. Seen yesterday for leg swelling.

## 2024-03-27 NOTE — Discharge Instructions (Signed)
 Return for any problem.  ?

## 2024-04-05 ENCOUNTER — Ambulatory Visit: Admitting: Podiatry

## 2024-04-24 ENCOUNTER — Ambulatory Visit (HOSPITAL_COMMUNITY)

## 2024-06-07 ENCOUNTER — Ambulatory Visit: Admitting: Podiatry

## 2024-06-07 ENCOUNTER — Encounter: Payer: Self-pay | Admitting: Podiatry

## 2024-06-07 DIAGNOSIS — B351 Tinea unguium: Secondary | ICD-10-CM | POA: Diagnosis not present

## 2024-06-07 DIAGNOSIS — M7751 Other enthesopathy of right foot: Secondary | ICD-10-CM | POA: Diagnosis not present

## 2024-06-07 DIAGNOSIS — M79671 Pain in right foot: Secondary | ICD-10-CM

## 2024-06-07 DIAGNOSIS — M7752 Other enthesopathy of left foot: Secondary | ICD-10-CM

## 2024-06-07 DIAGNOSIS — M79672 Pain in left foot: Secondary | ICD-10-CM | POA: Diagnosis not present

## 2024-06-07 NOTE — Progress Notes (Signed)
 Patient presents for evaluation and treatment of tenderness and some redness around nails feet.  Tenderness around toes with walking and wearing shoes.  Tenderness around the first metatarsal phalangeal joint and his bunion sites.  These bother him with certain shoes and when he does a lot of walking.  Physical exam:  General appearance: Alert, pleasant, and in no acute distress.  Vascular: Pedal pulses: DP 2/4 B/L, PT 2/4 B/L.  Moderate edema lower legs bilaterally.  Capillary refill time immediate bilaterally  Neurologic:  Dermatologic:  Nails thickened, disfigured, discolored 1-5 BL with subungual debris.  Redness and hypertrophic nail folds along nail folds bilaterally but no signs of drainage or infection.  Musculoskeletal:  Some tenderness plantar medial aspect first MTP bilaterally.  Severe hallux valgus deformity bilaterally.  Hammertoe deformities 2 through 5 bilaterally.  Normal muscle strength lower extremity bilaterally   Diagnosis: 1. Painful onychomycotic nails 1 through 5 bilaterally. 2. Pain toes 1 through 5 bilaterally. 3.  Capsulitis first MTP bilaterally  Plan: -New patient visit for evaluation and management level 3.  Modifier 25. -Discussed with him the capsulitis resulting in the bunion deformities bilaterally.  Recommend wearing good shoe with a wide toebox.  Recommended new balance.  Wear socks with good cushioning.  Discussed precautions to take as a diabetic.  Told him what to look out for as far as problems.  Recommended periodic debridement of the toenails with fungus  -Debrided onychomycotic nails 1 through 5 bilaterally.  Sharply debrided nails with nail clipper and reduced with a power bur.  Return 3 months [facility]

## 2024-07-19 ENCOUNTER — Other Ambulatory Visit: Payer: Self-pay | Admitting: Vascular Surgery

## 2024-07-19 DIAGNOSIS — M7989 Other specified soft tissue disorders: Secondary | ICD-10-CM

## 2024-08-08 ENCOUNTER — Ambulatory Visit (HOSPITAL_COMMUNITY)

## 2024-08-08 ENCOUNTER — Ambulatory Visit (HOSPITAL_COMMUNITY): Admitting: Vascular Surgery

## 2024-09-05 ENCOUNTER — Ambulatory Visit: Admitting: Podiatry
